# Patient Record
Sex: Male | Born: 1955 | Race: White | Hispanic: No | Marital: Single | Smoking: Former smoker
Health system: Southern US, Community
[De-identification: ages and names within clinical notes are randomized; demographics above are authoritative.]

## PROBLEM LIST (undated history)

## (undated) DIAGNOSIS — E7800 Pure hypercholesterolemia, unspecified: Secondary | ICD-10-CM

## (undated) DIAGNOSIS — I712 Thoracic aortic aneurysm, without rupture, unspecified: Secondary | ICD-10-CM

## (undated) DIAGNOSIS — I71019 Dissection of thoracic aorta, unspecified: Secondary | ICD-10-CM

## (undated) DIAGNOSIS — I350 Nonrheumatic aortic (valve) stenosis: Secondary | ICD-10-CM

## (undated) DIAGNOSIS — Z952 Presence of prosthetic heart valve: Secondary | ICD-10-CM

## (undated) DIAGNOSIS — I2510 Atherosclerotic heart disease of native coronary artery without angina pectoris: Secondary | ICD-10-CM

## (undated) DIAGNOSIS — K5790 Diverticulosis of intestine, part unspecified, without perforation or abscess without bleeding: Secondary | ICD-10-CM

## (undated) DIAGNOSIS — I7101 Dissection of thoracic aorta: Secondary | ICD-10-CM

## (undated) DIAGNOSIS — Z8489 Family history of other specified conditions: Secondary | ICD-10-CM

## (undated) DIAGNOSIS — K259 Gastric ulcer, unspecified as acute or chronic, without hemorrhage or perforation: Secondary | ICD-10-CM

## (undated) HISTORY — PX: OTHER SURGICAL HISTORY: SHX169

## (undated) HISTORY — DX: Diverticulosis of intestine, part unspecified, without perforation or abscess without bleeding: K57.90

## (undated) HISTORY — DX: Presence of prosthetic heart valve: Z95.2

## (undated) HISTORY — DX: Gastric ulcer, unspecified as acute or chronic, without hemorrhage or perforation: K25.9

## (undated) HISTORY — PX: EXPLORATION POST OPERATIVE OPEN HEART: SHX5061

## (undated) HISTORY — PX: ESOPHAGOGASTRODUODENOSCOPY: SHX1529

## (undated) HISTORY — DX: Pure hypercholesterolemia, unspecified: E78.00

## (undated) HISTORY — PX: ESOPHAGOGASTRODUODENOSCOPY: SHX5428

## (undated) HISTORY — DX: Dissection of thoracic aorta, unspecified: I71.019

## (undated) HISTORY — PX: CARDIAC VALVE REPLACEMENT: SHX585

## (undated) HISTORY — DX: Dissection of thoracic aorta: I71.01

## (undated) HISTORY — DX: Nonrheumatic aortic (valve) stenosis: I35.0

## (undated) HISTORY — DX: Thoracic aortic aneurysm, without rupture: I71.2

## (undated) HISTORY — DX: Thoracic aortic aneurysm, without rupture, unspecified: I71.20

---

## 2005-06-26 ENCOUNTER — Emergency Department (HOSPITAL_COMMUNITY): Admission: EM | Admit: 2005-06-26 | Discharge: 2005-06-26 | Payer: Self-pay | Admitting: Family Medicine

## 2009-09-05 ENCOUNTER — Ambulatory Visit: Payer: Self-pay | Admitting: Cardiology

## 2009-10-05 ENCOUNTER — Ambulatory Visit: Payer: Self-pay | Admitting: Cardiology

## 2009-11-06 ENCOUNTER — Ambulatory Visit: Payer: Self-pay | Admitting: Cardiology

## 2009-11-16 ENCOUNTER — Encounter: Payer: Self-pay | Admitting: Cardiology

## 2009-11-16 ENCOUNTER — Ambulatory Visit: Payer: Self-pay | Admitting: Cardiology

## 2009-11-16 ENCOUNTER — Ambulatory Visit (HOSPITAL_COMMUNITY): Admission: RE | Admit: 2009-11-16 | Discharge: 2009-11-16 | Payer: Self-pay | Admitting: Cardiology

## 2009-11-16 ENCOUNTER — Ambulatory Visit: Payer: Self-pay

## 2009-12-06 ENCOUNTER — Ambulatory Visit: Payer: Self-pay | Admitting: Cardiology

## 2009-12-06 ENCOUNTER — Encounter: Admission: RE | Admit: 2009-12-06 | Discharge: 2009-12-06 | Payer: Self-pay | Admitting: Cardiology

## 2009-12-11 ENCOUNTER — Encounter: Admission: RE | Admit: 2009-12-11 | Discharge: 2009-12-11 | Payer: Self-pay | Admitting: Cardiology

## 2009-12-21 ENCOUNTER — Ambulatory Visit: Payer: Self-pay | Admitting: Cardiothoracic Surgery

## 2010-01-09 ENCOUNTER — Ambulatory Visit: Payer: Self-pay | Admitting: Cardiology

## 2010-01-26 ENCOUNTER — Encounter: Payer: Self-pay | Admitting: Cardiology

## 2010-01-26 DIAGNOSIS — I350 Nonrheumatic aortic (valve) stenosis: Secondary | ICD-10-CM | POA: Insufficient documentation

## 2010-01-26 DIAGNOSIS — E7800 Pure hypercholesterolemia, unspecified: Secondary | ICD-10-CM | POA: Insufficient documentation

## 2010-01-26 DIAGNOSIS — Z952 Presence of prosthetic heart valve: Secondary | ICD-10-CM | POA: Insufficient documentation

## 2010-01-30 ENCOUNTER — Encounter: Payer: Self-pay | Admitting: Cardiology

## 2010-02-07 ENCOUNTER — Encounter (INDEPENDENT_AMBULATORY_CARE_PROVIDER_SITE_OTHER): Payer: 59

## 2010-02-07 DIAGNOSIS — Z7901 Long term (current) use of anticoagulants: Secondary | ICD-10-CM

## 2010-02-14 ENCOUNTER — Encounter (INDEPENDENT_AMBULATORY_CARE_PROVIDER_SITE_OTHER): Payer: 59

## 2010-02-14 DIAGNOSIS — I359 Nonrheumatic aortic valve disorder, unspecified: Secondary | ICD-10-CM

## 2010-02-14 DIAGNOSIS — Z7901 Long term (current) use of anticoagulants: Secondary | ICD-10-CM

## 2010-03-01 ENCOUNTER — Other Ambulatory Visit (INDEPENDENT_AMBULATORY_CARE_PROVIDER_SITE_OTHER): Payer: 59

## 2010-03-01 DIAGNOSIS — I359 Nonrheumatic aortic valve disorder, unspecified: Secondary | ICD-10-CM

## 2010-03-01 DIAGNOSIS — Z7901 Long term (current) use of anticoagulants: Secondary | ICD-10-CM

## 2010-03-09 ENCOUNTER — Ambulatory Visit (INDEPENDENT_AMBULATORY_CARE_PROVIDER_SITE_OTHER): Payer: 59 | Admitting: Cardiology

## 2010-03-09 DIAGNOSIS — Z954 Presence of other heart-valve replacement: Secondary | ICD-10-CM

## 2010-03-09 DIAGNOSIS — K277 Chronic peptic ulcer, site unspecified, without hemorrhage or perforation: Secondary | ICD-10-CM

## 2010-03-09 DIAGNOSIS — Z7901 Long term (current) use of anticoagulants: Secondary | ICD-10-CM

## 2010-03-09 DIAGNOSIS — I359 Nonrheumatic aortic valve disorder, unspecified: Secondary | ICD-10-CM

## 2010-04-10 ENCOUNTER — Ambulatory Visit (INDEPENDENT_AMBULATORY_CARE_PROVIDER_SITE_OTHER): Payer: 59 | Admitting: *Deleted

## 2010-04-10 DIAGNOSIS — I359 Nonrheumatic aortic valve disorder, unspecified: Secondary | ICD-10-CM

## 2010-04-10 DIAGNOSIS — Z7901 Long term (current) use of anticoagulants: Secondary | ICD-10-CM

## 2010-04-10 LAB — POCT INR: INR: 2.4

## 2010-04-11 ENCOUNTER — Other Ambulatory Visit: Payer: 59 | Admitting: *Deleted

## 2010-04-11 ENCOUNTER — Telehealth: Payer: Self-pay | Admitting: Cardiology

## 2010-04-11 NOTE — Telephone Encounter (Signed)
Advised should not need SBE for endoscopy, but may want to see what Dr Louis Matte recommendations are.  Also no need for lovenox injections or discontinuing coumadin unless there is potential for biopsy.  Patient states there is not any.

## 2010-04-11 NOTE — Telephone Encounter (Signed)
Having an endoscopy on 04/30/10.  Wants to know about premedication.

## 2010-04-17 ENCOUNTER — Telehealth: Payer: Self-pay | Admitting: Cardiology

## 2010-04-17 NOTE — Telephone Encounter (Signed)
Dr.Schooler's assistant, Juliette Alcide, would like to know who will initiate the Lovinox so that the patient can have his surgery.  Please call to confirm when the patient will discontinue the Coumadin

## 2010-04-18 ENCOUNTER — Telehealth: Payer: Self-pay | Admitting: *Deleted

## 2010-04-18 NOTE — Telephone Encounter (Signed)
L/M with an appt. Regarding his lab work prior to his office visit; appt was made

## 2010-04-24 NOTE — Telephone Encounter (Signed)
Left another message for patient to call back 

## 2010-04-25 NOTE — Telephone Encounter (Signed)
Spoke with patient and pharmacy regarding coumadin and lovenox.  Last dose of coumadin on 4/18, procedure on 4/23.  Will use lovenox 70 mg q 12 hours on 4/21,4/22,4/24, and 4/25.  Will resume coumadin after procedure.  Patient is having endoscopy with possible biopsy.

## 2010-04-25 NOTE — Telephone Encounter (Signed)
Plans for Lovenox bridging were reviewed and are appropriate.

## 2010-04-27 ENCOUNTER — Encounter: Payer: Self-pay | Admitting: Cardiology

## 2010-04-30 ENCOUNTER — Encounter: Payer: Self-pay | Admitting: Cardiology

## 2010-05-08 ENCOUNTER — Other Ambulatory Visit: Payer: Self-pay | Admitting: Cardiothoracic Surgery

## 2010-05-08 ENCOUNTER — Ambulatory Visit (INDEPENDENT_AMBULATORY_CARE_PROVIDER_SITE_OTHER): Payer: 59 | Admitting: *Deleted

## 2010-05-08 DIAGNOSIS — Z954 Presence of other heart-valve replacement: Secondary | ICD-10-CM | POA: Insufficient documentation

## 2010-05-08 DIAGNOSIS — I712 Thoracic aortic aneurysm, without rupture: Secondary | ICD-10-CM

## 2010-05-08 DIAGNOSIS — I359 Nonrheumatic aortic valve disorder, unspecified: Secondary | ICD-10-CM

## 2010-05-08 DIAGNOSIS — Z7901 Long term (current) use of anticoagulants: Secondary | ICD-10-CM | POA: Insufficient documentation

## 2010-05-08 LAB — POCT INR: INR: 2.2

## 2010-05-09 ENCOUNTER — Other Ambulatory Visit: Payer: 59 | Admitting: *Deleted

## 2010-05-17 ENCOUNTER — Encounter: Payer: Self-pay | Admitting: Cardiology

## 2010-05-22 NOTE — Assessment & Plan Note (Signed)
OFFICE VISIT   Ryan Lamb, Ryan Lamb  DOB:  1955/06/28                                        December 21, 2009  CHART #:  [id number]   REASON FOR CONSULTATION:  Dilated ascending aorta.   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  Prior to that at age 49, the patient  had undergone an open operative procedure on his aortic valve.  Over  this past 17 years, he has done extremely well with minimal problems  with Coumadin.  Currently, works in a Control and instrumentation engineer.  He has had no  evidence of heart failure or hypertension.  On routine chest x-ray, it  was suggested that he had some dilatation of his ascending aorta and a  CT scan of the chest was done and an echocardiogram that showed a  dilated ascending aorta to 4.8 cm.  He has had no other CT scans for  comparison.  He has no history of Marfan's or other connective tissue  disorder other than congenital aortic stenosis.   PAST MEDICAL HISTORY:  The patient does have hyperlipidemia.  No  previous myocardial infarction, no heart failure, no hypertension, no  diabetes.   PAST SURGICAL HISTORY:  As noted above.   SOCIAL HISTORY:  The patient is single, works in Control and instrumentation engineer.  He  smoked, but he quit smoking in 1981.  Does not drink alcohol.   FAMILY HISTORY:  Significant for his father who about 1 month after he  had his valve replaced; 17 years ago, had coronary artery bypass  grafting done by me.  There is no family history of Marfan's or aortic  dissections.   REVIEW OF SYSTEMS:  The patient denies change in weight.  Denies fever,  chills, or night sweats.  Denies hemoptysis.  He has noted over the past  several years episodes of small amount of rectal bleeding.  He  attributes this to when his INR gets a little higher than it should be,  though he has not previously  had a colonoscopy.   PHYSICAL EXAMINATION:  The patient's blood pressure 115/78, pulse 82,  respiratory rate is 18, and O2 sats 97%.  He is 5 feet 10 inches tall  and 163 pounds.  Temperature is 98.4.  He is alert, neurologically  intact.  His lungs are clear bilaterally.  Cardiac exam reveals regular  rate and rhythm.  Valve sounds are crisp.  There is no murmur of aortic  insufficiency.  He has no pedal edema.  Abdominal exam is benign without  palpable masses or tenderness.  He has full radial and brachial pulses  and femoral pulses bilaterally.   MEDICATIONS:  The patient's medications include Coumadin as regulated by  Dr. Yevonne Pax office, multivitamins, Simcor 1000/20, and aspirin 325 a  day.   ALLERGIES:  He has a questionable allergy to penicillin.   DIAGNOSTIC TESTS:  The patient's CT scan of the chest is reviewed and  the report of his echocardiogram is reviewed.  He does have an ascending  aorta that measures 4.7 cm.  The descending aorta is not structurally  enlarged.  By echo, his aortic root diameter is 37 mm.   IMPRESSION:  The patient now 17 years status post aortic valve  replacement with a mechanical valve.  It is noted to have incidental  dilatation of his ascending aorta without history of Marfan's or other  connective tissue disorder, though he does have a history of congenital  aortic stenosis.  I have reviewed the findings of the CT scan with the  patient and his family.  At this point, I have recommended that we can  start close surveillance.  In general, elective replacement of ascending  aorta would be about 5.5 cm.  However, with the patient's relatively  young age and overall good medical condition, the risk of surgery versus  progressive enlargement over the remainder of his lifetime may warrant  earlier intervention.  I have reviewed with him the signs and symptoms  of aortic dissection and I had instructed him should he experience any  of these to  seek emergency medical treatment immediately.  It is a  concern that he has had some rectal bleeding, though it appears minor at  age 19 and on Coumadin and with the potential, in the near future,  proceeding with replacement of his ascending aorta.  It would be prudent  to obtain a gastrointestinal consultation and colonoscopy to make sure  there is no underlying disease process that may influence a future valve  replacement.  I have made the patient a return appointment to see me in  6 months with a CT scan, CTA of the aorta to evaluate the size and see  if we determine a rate of change.  I will leave it up to Dr. Yevonne Pax  discretion to make a referral to gastrointestinal for a colonoscopy.   Sheliah Plane, MD  Electronically Signed   EG/MEDQ  D:  12/21/2009  T:  12/22/2009  Job:  [id number]   cc:   Cassell Clement, M.D.

## 2010-05-23 ENCOUNTER — Encounter: Payer: 59 | Admitting: *Deleted

## 2010-05-24 ENCOUNTER — Telehealth: Payer: Self-pay | Admitting: *Deleted

## 2010-05-24 NOTE — Telephone Encounter (Signed)
Left message, needs protime.  Was scheduled for 5/17 and no showed

## 2010-05-28 ENCOUNTER — Ambulatory Visit (INDEPENDENT_AMBULATORY_CARE_PROVIDER_SITE_OTHER): Payer: 59 | Admitting: *Deleted

## 2010-05-28 DIAGNOSIS — I359 Nonrheumatic aortic valve disorder, unspecified: Secondary | ICD-10-CM

## 2010-05-28 NOTE — Telephone Encounter (Signed)
Spoke with patient, no showed for INR last week.  Coming today

## 2010-06-25 ENCOUNTER — Ambulatory Visit (INDEPENDENT_AMBULATORY_CARE_PROVIDER_SITE_OTHER): Payer: 59 | Admitting: *Deleted

## 2010-06-25 DIAGNOSIS — I359 Nonrheumatic aortic valve disorder, unspecified: Secondary | ICD-10-CM

## 2010-07-19 ENCOUNTER — Ambulatory Visit
Admission: RE | Admit: 2010-07-19 | Discharge: 2010-07-19 | Disposition: A | Discharge status: Home / Self Care | Payer: 59 | Source: Ambulatory Visit | Attending: Cardiothoracic Surgery | Admitting: Cardiothoracic Surgery

## 2010-07-19 ENCOUNTER — Ambulatory Visit (INDEPENDENT_AMBULATORY_CARE_PROVIDER_SITE_OTHER): Payer: 59 | Admitting: Cardiothoracic Surgery

## 2010-07-19 DIAGNOSIS — I712 Thoracic aortic aneurysm, without rupture: Secondary | ICD-10-CM

## 2010-07-19 MED ORDER — IOHEXOL 300 MG/ML  SOLN
100.0000 mL | Freq: Once | INTRAMUSCULAR | Status: AC | PRN
  Administered 2010-07-19: 100 mL via INTRAVENOUS

## 2010-07-19 NOTE — Assessment & Plan Note (Signed)
OFFICE VISIT  Ryan Lamb, Ryan Lamb DOB:  10/16/1955                                        July 19, 2010 CHART #:  [id number]  The patient returns to the office today.  The patient in 1971, had aortic valvotomy and subsequently in 1994, I replaced his aortic valve with a 23 St. Jude mechanical prosthesis.  He came to see me in December of 2011, because on routine chest x-ray, there was a suggestion of some dilatation of his ascending aorta.  CT scan showed a dilated aorta between 4.5-4.8 cm.  At that time, he had also had history of mild rectal bleeding.  Since last seen, he has had colonoscopy, upper GI endoscopy, he had a polyp removed, and was treated for upper GI ulcer disease.  He has no family history of connective tissue or Marfan disease.  Since seen last seen, he has been asymptomatic, he has had no symptoms of heart failure.  On exam, his blood pressure is 129/81, pulse is 90, respiratory rate is 18, O2 sats 100% on room air.  Sternum is stable and well healed.  Lungs are clear bilaterally.  Cardiac exam reveals a regular rate.  Bowel sounds are crisp.  He has no murmur of aortic insufficiency.  He does have auscultation, a normally functioning mechanical valve.  Patient's medications include Coumadin, multivitamin, 325 mg of aspirin, Lipitor 40 mg a day, and Protonix 40 mg a day.  A followup CT scan is performed and compared to his previous scan December 2011, appears unchanged.  On my measurements, it appears that the aorta is dilated to 45-46 mm on various views.  At this point, with no change in the size of the ascending aorta, I have recommend the patient that we continue to have close surveillance, but not proceed with elective aorta replacement at this point, at 4.5-4.6 cm range.  The patient is not on beta-blocker or ACE inhibitor.  He is followed by Dr. Patty Sermons.  We could consider low-dose beta-blocker being started.  Today,  his blood pressure is 129/81.  I will plan to see him back in between 10 and 12 months with a repeat CTA of the chest.  Sheliah Plane, MD Electronically Signed  EG/MEDQ  D:  07/19/2010  T:  07/19/2010  Job:  [id number]  cc:   Cassell Clement, M.D.

## 2010-07-23 ENCOUNTER — Other Ambulatory Visit: Payer: Self-pay | Admitting: Cardiology

## 2010-07-23 DIAGNOSIS — Z952 Presence of prosthetic heart valve: Secondary | ICD-10-CM

## 2010-07-23 MED ORDER — WARFARIN SODIUM 5 MG PO TABS: 5.0000 mg | ORAL_TABLET | Freq: Every day | ORAL | Status: DC

## 2010-07-23 NOTE — Telephone Encounter (Signed)
Pt wants [facility]

## 2010-07-24 ENCOUNTER — Ambulatory Visit (INDEPENDENT_AMBULATORY_CARE_PROVIDER_SITE_OTHER): Payer: 59 | Admitting: *Deleted

## 2010-07-24 DIAGNOSIS — I359 Nonrheumatic aortic valve disorder, unspecified: Secondary | ICD-10-CM

## 2010-08-08 ENCOUNTER — Telehealth: Payer: Self-pay | Admitting: Cardiology

## 2010-08-08 NOTE — Telephone Encounter (Signed)
Please call annette back regarding how long pt needs to be off of coumadin to have lasik

## 2010-08-09 ENCOUNTER — Telehealth: Payer: Self-pay | Admitting: *Deleted

## 2010-08-09 NOTE — Telephone Encounter (Signed)
Receptionist from Dr. Almyra Brace office wanted to know if pt could stop coumadin prior to lasik eye surgery. Per Dr. Patty Sermons would prefer that pt not stop coumadin since he has had an Aortic Valve Replacement- he would have to be bridged w/Lovenax. States she will advise Dr. Anette Guarneri.

## 2010-08-21 ENCOUNTER — Ambulatory Visit (INDEPENDENT_AMBULATORY_CARE_PROVIDER_SITE_OTHER): Payer: 59 | Admitting: *Deleted

## 2010-08-21 DIAGNOSIS — I359 Nonrheumatic aortic valve disorder, unspecified: Secondary | ICD-10-CM

## 2010-08-21 LAB — POCT INR: INR: 2.6

## 2010-08-31 ENCOUNTER — Ambulatory Visit: Payer: 59 | Admitting: Cardiology

## 2010-09-17 ENCOUNTER — Ambulatory Visit: Payer: 59 | Admitting: Cardiology

## 2010-09-18 ENCOUNTER — Ambulatory Visit (INDEPENDENT_AMBULATORY_CARE_PROVIDER_SITE_OTHER): Payer: 59 | Admitting: *Deleted

## 2010-09-18 DIAGNOSIS — I359 Nonrheumatic aortic valve disorder, unspecified: Secondary | ICD-10-CM

## 2010-10-18 ENCOUNTER — Telehealth: Payer: Self-pay | Admitting: Cardiology

## 2010-10-18 ENCOUNTER — Ambulatory Visit (INDEPENDENT_AMBULATORY_CARE_PROVIDER_SITE_OTHER): Payer: 59 | Admitting: *Deleted

## 2010-10-18 ENCOUNTER — Encounter: Payer: Self-pay | Admitting: Cardiology

## 2010-10-18 ENCOUNTER — Ambulatory Visit (INDEPENDENT_AMBULATORY_CARE_PROVIDER_SITE_OTHER): Payer: 59 | Admitting: Cardiology

## 2010-10-18 VITALS — BP 108/62 | HR 74 | Ht 70.0 in | Wt 164.0 lb

## 2010-10-18 DIAGNOSIS — I712 Thoracic aortic aneurysm, without rupture: Secondary | ICD-10-CM

## 2010-10-18 DIAGNOSIS — I359 Nonrheumatic aortic valve disorder, unspecified: Secondary | ICD-10-CM

## 2010-10-18 DIAGNOSIS — E7800 Pure hypercholesterolemia, unspecified: Secondary | ICD-10-CM

## 2010-10-18 DIAGNOSIS — K219 Gastro-esophageal reflux disease without esophagitis: Secondary | ICD-10-CM

## 2010-10-18 DIAGNOSIS — Z952 Presence of prosthetic heart valve: Secondary | ICD-10-CM

## 2010-10-18 DIAGNOSIS — I7121 Aneurysm of the ascending aorta, without rupture: Secondary | ICD-10-CM

## 2010-10-18 NOTE — Assessment & Plan Note (Signed)
The patient has a past history of hypercholesterolemia.  He is on atorvastatin 40 mg daily.  He is having no side effects from the statin therapy

## 2010-10-18 NOTE — Progress Notes (Signed)
Ryan Lamb Date of Birth:  06-14-1955 [facility] / [facility] [address]   [address] [place], [place]  [address] [phone]           Fax   [phone]  HPI: This pleasant 55 year old gentleman is seen for a scheduled followup office visit.  He has a past history of aortic stenosis and has a St. Jude aortic valve prosthesis, which was placed in 1994.  He has remained in normal sinus rhythm.  He is on long-term Coumadin.  He has not had any chest pain or TIA symptoms.  He had an echocardiogram on 11/16/09, which showed a moderately dilated ascending aorta.  A CT angiogram of the chest with contrast showed an ascending aortic aneurysm, measuring 4.8 x 4.8 cm.  The patient subsequently had a consultation with Dr. Tyrone Sage who according to the patient recheck the size and it did not appear to be quite so large.  This lady is for watchful waiting, and the patient will be seen again by Dr. Tyrone Sage.  Next spring.  Patient also has a past history of peptic ulcer disease, and GERD.  He is under the care of Dr. Bosie Clos.  Current Outpatient Prescriptions  Medication Sig Dispense Refill  . aspirin 325 MG tablet Take 325 mg by mouth as needed.       Marland Kitchen atorvastatin (LIPITOR) 40 MG tablet Take 40 mg by mouth daily.        . MULTIPLE VITAMIN PO Take by mouth.        . pantoprazole (PROTONIX) 20 MG tablet Take 20 mg by mouth as needed.       . warfarin (COUMADIN) 5 MG tablet Take 1 tablet (5 mg total) by mouth daily. 5mg x 6 days 0 x1 day  90 tablet  3    Allergies  Allergen Reactions  . Penicillins     Patient Active Problem List  Diagnoses  . Aortic stenosis  . Aortic valve prosthesis present  . Hypercholesterolemia  . Aortic valve disorders  . Heart valve replaced by other means  . Encounter for long-term (current) use of anticoagulants  . Ascending aortic aneurysm    History  Smoking status  . Former Smoker -- 0.5 packs/day for 5 years  . Quit date: 10/17/1977    Smokeless tobacco  . Never Used    History  Alcohol Use No    No family history on file.  Review of Systems: The patient denies any heat or cold intolerance.  No weight gain or weight loss.  The patient denies headaches or blurry vision.  There is no cough or sputum production.  The patient denies dizziness.  There is no hematuria or hematochezia.  The patient denies any muscle aches or arthritis.  The patient denies any rash.  The patient denies frequent falling or instability.  There is no history of depression or anxiety.  All other systems were reviewed and are negative.   Physical Exam: Filed Vitals:   10/18/10 0849  BP: 108/62  Pulse: 74   the general appearance reveals a well-developed, well-nourished, gentleman in no distress.Pupils equal and reactive.   Extraocular Movements are full.  There is no scleral icterus.  The mouth and pharynx are normal.  The neck is supple.  The carotids reveal no bruits.  The jugular venous pressure is normal.  The thyroid is not enlarged.  There is no lymphadenopathy.  The chest is clear to percussion and auscultation. There are no rales or rhonchi.  Expansion of the chest is symmetrical.  The heart reveals a sharp opening and closing aortic valve click and no aortic insufficiency murmur.The abdomen is soft and nontender. Bowel sounds are normal. The liver and spleen are not enlarged. There Are no abdominal masses. There are no bruits.  The pedal pulses are good.  There is no phlebitis or edema.  There is no cyanosis or clubbing. Strength is normal and symmetrical in all extremities.  There is no lateralizing weakness.  There are no sensory deficits.  The skin is warm and dry.  There is no rash.     Assessment / Plan:  Continue same medication.  3 continue careful diet.  We will plan to recheck him in 4 months for office visit and EKG and fasting lab work.

## 2010-10-18 NOTE — Patient Instructions (Signed)
continue same dose of medications  Your physician wants you to follow-up in: 4 months You will receive a reminder letter in the mail two months in advance. If you don't receive a letter, please call our office to schedule the follow-up appointment.  

## 2010-10-18 NOTE — Telephone Encounter (Signed)
Pt called and would like for you to call him to update on some medications he is taking and to see if the tests he is due can be done earlier.  [phone].

## 2010-10-18 NOTE — Telephone Encounter (Signed)
Wanted labs done before end of year, scheduled

## 2010-10-18 NOTE — Assessment & Plan Note (Signed)
The patient has not been any cardiovascular symptoms.  He exercises regularly.  He plays golf.  He walks for exercise 3 times a week and he also does sit-ups and pushups at home on a regular basis.  His weight is down 2 pounds since last visit.

## 2010-11-15 ENCOUNTER — Ambulatory Visit (INDEPENDENT_AMBULATORY_CARE_PROVIDER_SITE_OTHER): Payer: 59 | Admitting: *Deleted

## 2010-11-15 DIAGNOSIS — I359 Nonrheumatic aortic valve disorder, unspecified: Secondary | ICD-10-CM

## 2010-11-15 DIAGNOSIS — K219 Gastro-esophageal reflux disease without esophagitis: Secondary | ICD-10-CM

## 2010-11-15 DIAGNOSIS — Z7901 Long term (current) use of anticoagulants: Secondary | ICD-10-CM

## 2010-11-15 DIAGNOSIS — E7800 Pure hypercholesterolemia, unspecified: Secondary | ICD-10-CM

## 2010-11-15 DIAGNOSIS — Z954 Presence of other heart-valve replacement: Secondary | ICD-10-CM

## 2010-11-15 LAB — LIPID PANEL
HDL: 48.8 mg/dL (ref >39.00)
Triglycerides: 158 mg/dL — ABNORMAL HIGH (ref 0.0–149.0)

## 2010-11-15 LAB — CBC WITH DIFFERENTIAL/PLATELET
Basophils Absolute: 0 10*3/uL (ref 0.0–0.1)
Basophils Relative: 0.3 % (ref 0.0–3.0)
Eosinophils Relative: 3.8 % (ref 0.0–5.0)
HCT: 46.7 % (ref 39.0–52.0)
Hemoglobin: 16 g/dL (ref 13.0–17.0)
Lymphs Abs: 1.4 10*3/uL (ref 0.7–4.0)
Monocytes Relative: 5.4 % (ref 3.0–12.0)
Neutro Abs: 4.1 10*3/uL (ref 1.4–7.7)
RBC: 5.18 Mil/uL (ref 4.22–5.81)
RDW: 13.5 % (ref 11.5–14.6)

## 2010-11-15 LAB — HEPATIC FUNCTION PANEL
ALT: 28 U/L (ref 0–53)
AST: 25 U/L (ref 0–37)
Albumin: 4.3 g/dL (ref 3.5–5.2)
Total Protein: 6.8 g/dL (ref 6.0–8.3)

## 2010-11-15 LAB — BASIC METABOLIC PANEL
Calcium: 9 mg/dL (ref 8.4–10.5)
Creatinine, Ser: 1 mg/dL (ref 0.4–1.5)
GFR: 87.77 mL/min (ref >60.00)
Sodium: 142 mEq/L (ref 135–145)

## 2010-11-15 LAB — POCT INR: INR: 3

## 2010-11-20 ENCOUNTER — Telehealth: Payer: Self-pay | Admitting: *Deleted

## 2010-11-20 NOTE — Telephone Encounter (Signed)
Mailed copy of labs and left message to call if any questions  

## 2010-11-20 NOTE — Telephone Encounter (Signed)
Message copied by Burnell Blanks on Tue Nov 20, 2010  2:09 PM ------      Message from: Cassell Clement      Created: Sat Nov 17, 2010  9:56 AM       Please report.  The labs are stable.  Continue same meds.  Continue careful diet.

## 2010-12-13 ENCOUNTER — Encounter: Payer: 59 | Admitting: *Deleted

## 2010-12-20 ENCOUNTER — Encounter: Payer: 59 | Admitting: *Deleted

## 2011-03-19 ENCOUNTER — Ambulatory Visit (INDEPENDENT_AMBULATORY_CARE_PROVIDER_SITE_OTHER): Payer: 59 | Admitting: Pharmacist

## 2011-03-19 DIAGNOSIS — Z7901 Long term (current) use of anticoagulants: Secondary | ICD-10-CM

## 2011-03-19 DIAGNOSIS — Z954 Presence of other heart-valve replacement: Secondary | ICD-10-CM

## 2011-03-19 DIAGNOSIS — I359 Nonrheumatic aortic valve disorder, unspecified: Secondary | ICD-10-CM

## 2011-04-22 ENCOUNTER — Ambulatory Visit (INDEPENDENT_AMBULATORY_CARE_PROVIDER_SITE_OTHER): Payer: 59 | Admitting: Cardiology

## 2011-04-22 ENCOUNTER — Ambulatory Visit (INDEPENDENT_AMBULATORY_CARE_PROVIDER_SITE_OTHER): Payer: 59 | Admitting: *Deleted

## 2011-04-22 ENCOUNTER — Encounter: Payer: Self-pay | Admitting: Cardiology

## 2011-04-22 VITALS — BP 128/80 | HR 71 | Ht 70.0 in | Wt 166.0 lb

## 2011-04-22 DIAGNOSIS — I359 Nonrheumatic aortic valve disorder, unspecified: Secondary | ICD-10-CM

## 2011-04-22 DIAGNOSIS — I712 Thoracic aortic aneurysm, without rupture: Secondary | ICD-10-CM

## 2011-04-22 DIAGNOSIS — E7800 Pure hypercholesterolemia, unspecified: Secondary | ICD-10-CM

## 2011-04-22 DIAGNOSIS — Z954 Presence of other heart-valve replacement: Secondary | ICD-10-CM

## 2011-04-22 DIAGNOSIS — Z952 Presence of prosthetic heart valve: Secondary | ICD-10-CM

## 2011-04-22 DIAGNOSIS — K219 Gastro-esophageal reflux disease without esophagitis: Secondary | ICD-10-CM

## 2011-04-22 DIAGNOSIS — Z7901 Long term (current) use of anticoagulants: Secondary | ICD-10-CM

## 2011-04-22 NOTE — Patient Instructions (Signed)
Your physician wants you to follow-up in: 4 months with Dr. Patty Sermons.  You will receive a reminder letter in the mail two months in advance. If you don't receive a letter, please call our office to schedule the follow-up appointment.  Your physician recommends that you return for fasting lab work in: 4 months.

## 2011-04-22 NOTE — Progress Notes (Signed)
Ryan Lamb Date of Birth:  1955/02/07 [facility] [address] [place], [place]  [address] [phone]  Fax   [phone]  HPI: This pleasant 56 year old gentleman is seen for a scheduled four-month followup office visit.  He has a past history of ear aortic stenosis and had a St. Jude aortic valve prosthesis placed in 1994.  He has remained in normal sinus rhythm.  He is on long-term Coumadin and has been compliant.  He had an echocardiogram in November 2011 which showed a moderately dilated descending aorta and this is being followed by Dr. Tyrone Sage.  Patient has a past history of peptic ulcer disease and GERD.  He has had no recent symptoms of GI distress and no evidence of hematochezia or melena.  Current Outpatient Prescriptions  Medication Sig Dispense Refill  . aspirin 325 MG tablet Take 325 mg by mouth as needed.       Marland Kitchen atorvastatin (LIPITOR) 40 MG tablet Take 10 mg by mouth daily.       . MULTIPLE VITAMIN PO Take by mouth.        . pantoprazole (PROTONIX) 20 MG tablet Take 20 mg by mouth as needed.       . warfarin (COUMADIN) 5 MG tablet Take 1 tablet (5 mg total) by mouth daily. 5mg x 6 days 0 x1 day  90 tablet  3    Allergies  Allergen Reactions  . Penicillins     Patient Active Problem List  Diagnoses  . Aortic stenosis  . Aortic valve prosthesis present  . Hypercholesterolemia  . Aortic valve disorders  . Heart valve replaced by other means  . Encounter for long-term (current) use of anticoagulants  . Ascending aortic aneurysm    History  Smoking status  . Former Smoker -- 0.5 packs/day for 5 years  . Quit date: 10/17/1977  Smokeless tobacco  . Never Used    History  Alcohol Use No    No family history on file.  Review of Systems: The patient denies any heat or cold intolerance.  No weight gain or weight loss.  The patient denies headaches or blurry vision.  There is no cough or sputum production.  The patient denies  dizziness.  There is no hematuria or hematochezia.  The patient denies any muscle aches or arthritis.  The patient denies any rash.  The patient denies frequent falling or instability.  There is no history of depression or anxiety.  All other systems were reviewed and are negative.   Physical Exam: Filed Vitals:   04/22/11 1101  BP: 128/80  Pulse: 71   the general appearance reveals a well-developed well-nourished gentleman in no distress.Pupils equal and reactive.   Extraocular Movements are full.  There is no scleral icterus.  The mouth and pharynx are normal.  The neck is supple.  The carotids reveal no bruits.  The jugular venous pressure is normal.  The thyroid is not enlarged.  There is no lymphadenopathy.  The chest is clear to percussion and auscultation. There are no rales or rhonchi. Expansion of the chest is symmetrical.  The heart reveals good aortic opening and closing aortic valve clicks and no aortic insufficiency.The abdomen is soft and nontender. Bowel sounds are normal. The liver and spleen are not enlarged. There Are no abdominal masses. There are no bruits.  The pedal pulses are good.  There is no phlebitis or edema.  There is no cyanosis or clubbing. Strength is normal and symmetrical in all  extremities.  There is no lateralizing weakness.  There are no sensory deficits.   EKG today shows normal sinus rhythm with sinus arrhythmia and no ischemic changes.    Assessment / Plan: The patient will continue same medication.  He has had some chronic right ear problems and Dr. Haroldine Laws recently treated him with doxycycline for suspected sinusitis.  The patient also has been having some problems with scrotal irritation and I gave him the name of a urologist Dr. Bjorn Pippin. Recheck here in 4 months for followup office visit lipid panel hepatic function panel and basal metabolic panel

## 2011-04-22 NOTE — Assessment & Plan Note (Signed)
The patient takes Protonix 20 mg daily as needed and has not been having any recent GI symptoms

## 2011-04-22 NOTE — Assessment & Plan Note (Addendum)
The patient has not been experiencing any symptoms referable to his ascending aortic aneurysm which is being followed by Dr. Tyrone Sage.

## 2011-04-22 NOTE — Assessment & Plan Note (Signed)
The patient has done well following his aortic valve replacement.  He is physically active.  He goes to the gym.  He has not been experiencing any exertional chest pain or increased dyspnea.  He also enjoys playing golf.  He also gets a lot of exercise at work.  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

## 2011-05-17 ENCOUNTER — Other Ambulatory Visit: Payer: Self-pay | Admitting: Cardiothoracic Surgery

## 2011-05-17 DIAGNOSIS — I712 Thoracic aortic aneurysm, without rupture: Secondary | ICD-10-CM

## 2011-06-18 ENCOUNTER — Ambulatory Visit (INDEPENDENT_AMBULATORY_CARE_PROVIDER_SITE_OTHER): Payer: 59

## 2011-06-18 DIAGNOSIS — Z7901 Long term (current) use of anticoagulants: Secondary | ICD-10-CM

## 2011-06-18 DIAGNOSIS — I359 Nonrheumatic aortic valve disorder, unspecified: Secondary | ICD-10-CM

## 2011-06-18 DIAGNOSIS — Z954 Presence of other heart-valve replacement: Secondary | ICD-10-CM

## 2011-06-20 ENCOUNTER — Other Ambulatory Visit: Payer: Self-pay | Admitting: Cardiology

## 2011-06-20 MED ORDER — ATORVASTATIN CALCIUM 40 MG PO TABS: 10.0000 mg | ORAL_TABLET | Freq: Every day | ORAL | Status: DC

## 2011-06-20 MED ORDER — ATORVASTATIN CALCIUM 10 MG PO TABS: 10.0000 mg | ORAL_TABLET | Freq: Every day | ORAL | Status: DC

## 2011-06-20 NOTE — Telephone Encounter (Signed)
Spoke with patient re-refill on atorvastatin 10mg . Daily, [facility]

## 2011-06-20 NOTE — Telephone Encounter (Signed)
New problem    [facility]  Phone number [phone]

## 2011-06-27 ENCOUNTER — Other Ambulatory Visit: Payer: 59

## 2011-06-27 ENCOUNTER — Ambulatory Visit: Payer: 59 | Admitting: Cardiothoracic Surgery

## 2011-07-16 ENCOUNTER — Ambulatory Visit (INDEPENDENT_AMBULATORY_CARE_PROVIDER_SITE_OTHER): Payer: 59 | Admitting: Pharmacist

## 2011-07-16 DIAGNOSIS — I359 Nonrheumatic aortic valve disorder, unspecified: Secondary | ICD-10-CM

## 2011-07-16 DIAGNOSIS — Z7901 Long term (current) use of anticoagulants: Secondary | ICD-10-CM

## 2011-07-16 DIAGNOSIS — Z954 Presence of other heart-valve replacement: Secondary | ICD-10-CM

## 2011-07-16 LAB — POCT INR: INR: 2.7

## 2011-07-25 ENCOUNTER — Ambulatory Visit: Payer: 59 | Admitting: Cardiothoracic Surgery

## 2011-07-25 ENCOUNTER — Other Ambulatory Visit: Payer: 59

## 2011-08-15 ENCOUNTER — Ambulatory Visit: Payer: 59 | Admitting: Cardiothoracic Surgery

## 2011-08-15 ENCOUNTER — Other Ambulatory Visit: Payer: 59

## 2011-08-20 ENCOUNTER — Encounter: Payer: Self-pay | Admitting: Cardiology

## 2011-08-20 ENCOUNTER — Ambulatory Visit (INDEPENDENT_AMBULATORY_CARE_PROVIDER_SITE_OTHER): Payer: 59 | Admitting: Cardiology

## 2011-08-20 ENCOUNTER — Other Ambulatory Visit (INDEPENDENT_AMBULATORY_CARE_PROVIDER_SITE_OTHER): Payer: 59

## 2011-08-20 ENCOUNTER — Ambulatory Visit (INDEPENDENT_AMBULATORY_CARE_PROVIDER_SITE_OTHER): Payer: 59 | Admitting: *Deleted

## 2011-08-20 VITALS — BP 110/72 | HR 83 | Ht 70.0 in | Wt 163.8 lb

## 2011-08-20 DIAGNOSIS — I712 Thoracic aortic aneurysm, without rupture: Secondary | ICD-10-CM

## 2011-08-20 DIAGNOSIS — Z952 Presence of prosthetic heart valve: Secondary | ICD-10-CM

## 2011-08-20 DIAGNOSIS — K219 Gastro-esophageal reflux disease without esophagitis: Secondary | ICD-10-CM

## 2011-08-20 DIAGNOSIS — Z7901 Long term (current) use of anticoagulants: Secondary | ICD-10-CM

## 2011-08-20 DIAGNOSIS — I359 Nonrheumatic aortic valve disorder, unspecified: Secondary | ICD-10-CM

## 2011-08-20 DIAGNOSIS — Z954 Presence of other heart-valve replacement: Secondary | ICD-10-CM

## 2011-08-20 DIAGNOSIS — R0989 Other specified symptoms and signs involving the circulatory and respiratory systems: Secondary | ICD-10-CM

## 2011-08-20 DIAGNOSIS — E7800 Pure hypercholesterolemia, unspecified: Secondary | ICD-10-CM

## 2011-08-20 LAB — LIPID PANEL
HDL: 49.8 mg/dL (ref >39.00)
LDL Cholesterol: 94 mg/dL (ref 0–99)
Total CHOL/HDL Ratio: 3
Triglycerides: 107 mg/dL (ref 0.0–149.0)
VLDL: 21.4 mg/dL (ref 0.0–40.0)

## 2011-08-20 LAB — POCT INR: INR: 4.3

## 2011-08-20 LAB — HEPATIC FUNCTION PANEL
Albumin: 4.2 g/dL (ref 3.5–5.2)
Alkaline Phosphatase: 51 U/L (ref 39–117)
Total Protein: 6.9 g/dL (ref 6.0–8.3)

## 2011-08-20 LAB — BASIC METABOLIC PANEL
CO2: 24 mEq/L (ref 19–32)
Chloride: 105 mEq/L (ref 96–112)
Creatinine, Ser: 1 mg/dL (ref 0.4–1.5)
Sodium: 137 mEq/L (ref 135–145)

## 2011-08-20 NOTE — Patient Instructions (Addendum)
Your physician recommends that you continue on your current medications as directed. Please refer to the Current Medication list given to you today.  Your physician recommends that you schedule a follow-up appointment in: 4 months with fasting labs (lp/bmet/hfp)   Will obtain labs today and call you with the results    

## 2011-08-20 NOTE — Assessment & Plan Note (Signed)
The patient has an appointment to see Dr. Tyrone Sage on September 8 for followup of his ascending aortic aneurysm

## 2011-08-20 NOTE — Assessment & Plan Note (Signed)
The patient has not been experiencing any chest pain or shortness of breath.  He exercises regularly at home and he also plays golf.

## 2011-08-20 NOTE — Progress Notes (Signed)
Ryan Lamb Date of Birth:  01-29-1955 [facility] [address] [place], [place]  [address] [phone]  Fax   [phone]  HPI: This pleasant 56 year old gentleman is seen for a scheduled four-month followup office visit.  He has a history of previous severe aortic stenosis.  He had a St. Jude aortic valve prosthesis placed in 1994 has a known moderately dilated descending aorta and this is being followed by Dr. Tyrone Sage.  Noted on echo in November 2011 the patient has been on long-term Coumadin for his mechanical aortic valve prosthesis.  He remains in normal sinus rhythm.  Current Outpatient Prescriptions  Medication Sig Dispense Refill  . aspirin 325 MG tablet Take 325 mg by mouth as needed.       Marland Kitchen atorvastatin (LIPITOR) 10 MG tablet Take 1 tablet (10 mg total) by mouth daily.  90 tablet  3  . MULTIPLE VITAMIN PO Take by mouth.        . pantoprazole (PROTONIX) 20 MG tablet Take 20 mg by mouth as needed.       . warfarin (COUMADIN) 5 MG tablet Take 1 tablet (5 mg total) by mouth daily. 5mg x 6 days 0 x1 day  90 tablet  3    Allergies  Allergen Reactions  . Penicillins     Patient Active Problem List  Diagnosis  . Aortic stenosis  . Aortic valve prosthesis present  . Hypercholesterolemia  . Aortic valve disorders  . Heart valve replaced by other means  . Encounter for long-term (current) use of anticoagulants  . Ascending aortic aneurysm  . GERD (gastroesophageal reflux disease)    History  Smoking status  . Former Smoker -- 0.5 packs/day for 5 years  . Quit date: 10/17/1977  Smokeless tobacco  . Never Used    History  Alcohol Use No    No family history on file.  Review of Systems: The patient denies any heat or cold intolerance.  No weight gain or weight loss.  The patient denies headaches or blurry vision.  There is no cough or sputum production.  The patient denies dizziness.  There is no hematuria or hematochezia.  The  patient denies any muscle aches or arthritis.  The patient denies any rash.  The patient denies frequent falling or instability.  There is no history of depression or anxiety.  All other systems were reviewed and are negative.   Physical Exam: Filed Vitals:   08/20/11 0821  BP: 110/72  Pulse: 83   the general appearance reveals a well-developed well-nourished gentleman distress.The head and neck exam reveals pupils equal and reactive.  Extraocular movements are full.  There is no scleral icterus.  The mouth and pharynx are normal.  The neck is supple.  The carotids reveal no bruits.  The jugular venous pressure is normal.  The  thyroid is not enlarged.  There is no lymphadenopathy.  The chest is clear to percussion and auscultation.  There are no rales or rhonchi.  Expansion of the chest is symmetrical.  The precordium is quiet.  The first heart sound is normal.  The second heart sound is physiologically split.  There are good and opening and closing prosthetic aortic valve sounds with a soft systolic murmur across the aortic valve prosthesis.  No diastolic murmur.  There is no abnormal lift or heave.  The abdomen is soft and nontender.  The bowel sounds are normal.  The liver and spleen are not enlarged.  There are no  abdominal masses.  There are no abdominal bruits.  Extremities reveal good pedal pulses.  There is no phlebitis or edema.  There is no cyanosis or clubbing.  Strength is normal and symmetrical in all extremities.  There is no lateralizing weakness.  There are no sensory deficits.  The skin is warm and dry.  There is no rash.      Assessment / Plan: Continue on same medication.  Lab work today pending.  Recheck in 4 months for followup office visit lipid panel hepatic function panel and basal metabolic panel and a CBC

## 2011-08-20 NOTE — Assessment & Plan Note (Signed)
The patient has a remote history of GI bleed.  He has GERD.  He has had no recent symptoms.  He takes Protonix on a when necessary basis

## 2011-08-20 NOTE — Assessment & Plan Note (Signed)
The patient has a history of hypercholesterolemia.  He is on Lipitor 10 mg daily.  He is not having any myalgias.  Blood work is pending

## 2011-08-21 NOTE — Progress Notes (Signed)
Quick Note:  Please report to patient. The recent labs are stable. Continue same medication and careful diet. The potassium is low so I want him to eat some bananas and other high potassium foods. He is not on any diuretic and his potassium was normal last time. We should recheck a basal metabolic panel when he returns for his next INR ______

## 2011-08-23 ENCOUNTER — Telehealth: Payer: Self-pay | Admitting: Cardiology

## 2011-08-23 DIAGNOSIS — E876 Hypokalemia: Secondary | ICD-10-CM

## 2011-08-23 NOTE — Telephone Encounter (Signed)
Advised patient of lab results and scheduled follow up labas

## 2011-08-23 NOTE — Telephone Encounter (Signed)
Pt spoke with you a couple of days ago and he was suppose to call you today so that is what he was doing please give him a call back

## 2011-08-23 NOTE — Telephone Encounter (Signed)
Message copied by Burnell Blanks on Fri Aug 23, 2011 11:17 AM ------      Message from: Cassell Clement      Created: Wed Aug 21, 2011  3:33 PM       Please report to patient.  The recent labs are stable. Continue same medication and careful diet.  The potassium is low so I want him to eat some bananas and other high potassium foods.  He is not on any diuretic and his potassium was normal last time.  We should recheck a basal metabolic panel when he returns for his next INR

## 2011-09-10 ENCOUNTER — Other Ambulatory Visit (INDEPENDENT_AMBULATORY_CARE_PROVIDER_SITE_OTHER): Payer: 59

## 2011-09-10 ENCOUNTER — Ambulatory Visit (INDEPENDENT_AMBULATORY_CARE_PROVIDER_SITE_OTHER): Payer: 59 | Admitting: Pharmacist

## 2011-09-10 DIAGNOSIS — Z7901 Long term (current) use of anticoagulants: Secondary | ICD-10-CM

## 2011-09-10 DIAGNOSIS — Z954 Presence of other heart-valve replacement: Secondary | ICD-10-CM

## 2011-09-10 DIAGNOSIS — I359 Nonrheumatic aortic valve disorder, unspecified: Secondary | ICD-10-CM

## 2011-09-10 DIAGNOSIS — E876 Hypokalemia: Secondary | ICD-10-CM

## 2011-09-10 LAB — BASIC METABOLIC PANEL
BUN: 12 mg/dL (ref 6–23)
Creatinine, Ser: 1.1 mg/dL (ref 0.4–1.5)
GFR: 74.66 mL/min (ref >60.00)
Glucose, Bld: 81 mg/dL (ref 70–99)

## 2011-09-12 ENCOUNTER — Ambulatory Visit (INDEPENDENT_AMBULATORY_CARE_PROVIDER_SITE_OTHER): Payer: 59 | Admitting: Cardiothoracic Surgery

## 2011-09-12 ENCOUNTER — Other Ambulatory Visit: Payer: 59

## 2011-09-12 ENCOUNTER — Encounter: Payer: Self-pay | Admitting: Cardiothoracic Surgery

## 2011-09-12 ENCOUNTER — Ambulatory Visit
Admission: RE | Admit: 2011-09-12 | Discharge: 2011-09-12 | Disposition: A | Discharge status: Home / Self Care | Payer: 59 | Source: Ambulatory Visit | Attending: Cardiothoracic Surgery | Admitting: Cardiothoracic Surgery

## 2011-09-12 VITALS — BP 130/71 | HR 84 | Resp 20 | Ht 70.0 in | Wt 165.0 lb

## 2011-09-12 DIAGNOSIS — I712 Thoracic aortic aneurysm, without rupture: Secondary | ICD-10-CM

## 2011-09-12 MED ORDER — IOHEXOL 300 MG/ML  SOLN
100.0000 mL | Freq: Once | INTRAMUSCULAR | Status: AC | PRN
  Administered 2011-09-12: 100 mL via INTRAVENOUS

## 2011-09-12 NOTE — Progress Notes (Signed)
[address]            [place] [address]          [phone]      [facility] Medical Record #[id number] Date of Birth: [birth year]  Referring: Cassell Clement, MD Primary Care: No primary provider on file.  Chief Complaint:    Chief Complaint  Patient presents with  . Thoracic Aortic Aneurysm    1 year f/u wtih CTA Chest, surveillance of ascending aorta aneurysm    History of Present Illness:    Patient returns today with a followup CTA of the chest. In 1971 the patient had aortic valvuloplasty. Subsequently in 1994 I replaced his aortic valve with a #23 St. Jude mechanical prosthesis. In December of 2011 because of a chest x-ray suggestive of dilatation of his descending aorta he was seen again and has had CT scans of the chest in 2011 2012 and today. He has no family history of connective tissue or Marfan's disorder. He remains asymptomatic.    Current Activity/ Functional Status: Patient remains active without any physical limitations    Past Medical History  Diagnosis Date  . Aortic stenosis as infant, with recurrent as and valve replacement   . Aortic valve prosthesis present   . Hypercholesterolemia     Past Surgical History  Procedure Date  . Cardiac valve replacement 1994    No family history on file. There is no family history of Marfan's or other connective tissue disorder or sudden death from dissection .  History   Social History  . Marital Status: Single    Spouse Name: N/A    Number of Children: N/A  . Years of Education: N/A   Occupational History  . Not on file.   Social History Main Topics  . Smoking status: Former Smoker -- 0.5 packs/day for 5 years    Quit date: 10/17/1977  . Smokeless tobacco: Never Used  . Alcohol Use: No  . Drug Use: No              Social History Narrative  . No narrative on file    History  Smoking status  . Former Smoker -- 0.5 packs/day for 5  years  . Quit date: 10/17/1977  Smokeless tobacco  . Never Used    History  Alcohol Use No     Allergies  Allergen Reactions  . Penicillins     Current Outpatient Prescriptions  Medication Sig Dispense Refill  . aspirin 325 MG tablet Take 325 mg by mouth as needed.       Marland Kitchen atorvastatin (LIPITOR) 10 MG tablet Take 1 tablet (10 mg total) by mouth daily.  90 tablet  3  . MULTIPLE VITAMIN PO Take by mouth.        . pantoprazole (PROTONIX) 20 MG tablet Take 20 mg by mouth as needed.       . warfarin (COUMADIN) 5 MG tablet Take 1 tablet (5 mg total) by mouth daily. 5mg x 6 days 0 x1 day  90 tablet  3   No current facility-administered medications for this visit.   Facility-Administered Medications Ordered in Other Visits  Medication Dose Route Frequency Provider Last Rate Last Dose  . iohexol (OMNIPAQUE) 300 MG/ML solution 100 mL  100 mL Intravenous Once PRN Medication Radiologist, MD   100 mL at 09/12/11 1511  Review of Systems:     Cardiac Review of Systems: Y or N  Chest Pain [  n  ]  Resting SOB [n   ] Exertional SOB  [ n ]  Orthopnea [  n]   Pedal Edema [   n]    Palpitations [ n ] Syncope  [ n ]   Presyncope [ n  ]  General Review of Systems: [Y] = yes [  ]=no Constitional: recent weight change [n  ]; anorexia [  ]; fatigue [  ]; nausea [  ]; night sweats [  ]; fever [  ]; or chills [  ];                                                                                                                                          Dental: poor dentition[n  ]; Last Dentist visit: yearly  Eye : blurred vision [  ]; diplopia [   ]; vision changes [  ];  Amaurosis fugax[  ]; Resp: cough [  ];  wheezing[  ];  hemoptysis[  ]; shortness of breath[  ]; paroxysmal nocturnal dyspnea[  ]; dyspnea on exertion[  ]; or orthopnea[  ];  GI:  gallstones[  ], vomiting[  ];  dysphagia[  ]; melena[ in past pylop removed ];  hematochezia [  ]; heartburn[  ];   Hx of  Colonoscopy[ y ]; GU: kidney  stones [  ]; hematuria[  ];   dysuria [  ];  nocturia[  ];  history of     obstruction [  ];             Skin: rash, swelling[  ];, hair loss[  ];  peripheral edema[  ];  or itching[  ]; Musculosketetal: myalgias[  ];  joint swelling[  ];  joint erythema[  ];  joint pain[  ];  back pain[  ];  Heme/Lymph: bruising[on coumadin  ];  bleeding[  ];  anemia[  ];  Neuro: TIA[ n ];  headaches[  ];  stroke[  ];  vertigo[  ];  seizures[  ];   paresthesias[  ];  difficulty walking[  ];  Psych:depression[  ]; anxiety[  ];  Endocrine: diabetes[  ];  thyroid dysfunction[  ];  Immunizations: Flu [  ]; Pneumococcal[  ];  Other:  Physical Exam: BP 130/71  Pulse 84  Resp 20  Ht 5\' 10"  (1.778 m)  Wt 165 lb (74.844 kg)  BMI 23.68 kg/m2  SpO2 99%  General appearance: alert, cooperative and appears stated age Neurologic: intact Heart: normal apical impulse and crisp mechanical valve sound s Lungs: clear to auscultation bilaterally and normal percussion bilaterally Abdomen: soft, non-tender; bowel sounds normal; no masses,  no organomegaly Extremities: extremities normal, atraumatic, no cyanosis or edema and Homans sign is negative, no sign of DVT Wound: stable incision   Diagnostic  Studies & Laboratory data:     Recent Radiology Findings:   Ct Angio Chest W/cm &/or Wo Cm  09/12/2011  *RADIOLOGY REPORT*  Clinical Data: Follow-up thoracic aortic aneurysm.  CT ANGIOGRAPHY CHEST  Technique:  Multidetector CT imaging of the chest using the standard protocol during bolus administration of intravenous contrast. Multiplanar reconstructed images including MIPs were obtained and reviewed to evaluate the vascular anatomy.  Contrast: OMNIPAQUE IOHEXOL 300 MG/ML  SOLN  Comparison: 07/19/2010  Findings: The ascending thoracic aortic aneurysm remains stable in size, measuring 4.8 cm in maximum diameter.  No evidence of aneurysm leak or rupture or thoracic aortic dissection.  No evidence of mediastinal or hilar  lymphadenopathy.  No adenopathy seen elsewhere within the thorax.  No evidence of pleural or pericardial effusion.  Both lungs are clear.  No central endobronchial lesion identified.  IMPRESSION: Stable 4.8 cm ascending thoracic aortic aneurysm.  No acute findings.   Original Report Authenticated By: Danae Orleans, M.D.       Recent Lab Findings: Lab Results  Component Value Date   WBC 6.1 11/15/2010   HGB 16.0 11/15/2010   HCT 46.7 11/15/2010   PLT 206.0 11/15/2010   GLUCOSE 81 09/10/2011   CHOL 165 08/20/2011   TRIG 107.0 08/20/2011   HDL 49.80 08/20/2011   LDLCALC 94 08/20/2011   ALT 20 08/20/2011   AST 23 08/20/2011   NA 142 09/10/2011   K 3.8 09/10/2011   CL 108 09/10/2011   CREATININE 1.1 09/10/2011   BUN 12 09/10/2011   CO2 28 09/10/2011   INR 3.2 09/10/2011      Assessment / Plan:      No change in four point eight centimeters ascending aortic dilatation in patient with previous mechanic aortic valve replacement. There's been no appreciable change in the size of the aorta since his scan in 2011. I discussed the diagnosis with the patient and we have decided to continue to monitor the size of his aorta and will obtain a followup CT scan in one year. I've discussed with him the elective replacement of the ascending aorta as it approaches 5.5 cm in the absence of a history of Marfan's. Patient is agreeable with this.  Delight Ovens MD  Beeper [phone] Office [phone] 09/12/2011 5:38 PM

## 2011-09-12 NOTE — Patient Instructions (Signed)
Aorta stable in size 4.8 cm, repeat ct in one year

## 2011-09-13 ENCOUNTER — Telehealth: Payer: Self-pay | Admitting: *Deleted

## 2011-09-13 NOTE — Telephone Encounter (Signed)
Advised patient of lab results  

## 2011-09-13 NOTE — Telephone Encounter (Signed)
Message copied by Burnell Blanks on Fri Sep 13, 2011  6:01 PM ------      Message from: Cassell Clement      Created: Tue Sep 10, 2011 12:38 PM       Please report.  The potassium is now back to normal.  Continue same medication and careful diet

## 2011-10-01 ENCOUNTER — Ambulatory Visit (INDEPENDENT_AMBULATORY_CARE_PROVIDER_SITE_OTHER): Payer: 59 | Admitting: *Deleted

## 2011-10-01 DIAGNOSIS — Z7901 Long term (current) use of anticoagulants: Secondary | ICD-10-CM

## 2011-10-01 DIAGNOSIS — Z954 Presence of other heart-valve replacement: Secondary | ICD-10-CM

## 2011-10-01 DIAGNOSIS — I359 Nonrheumatic aortic valve disorder, unspecified: Secondary | ICD-10-CM

## 2011-10-01 LAB — POCT INR: INR: 3.3

## 2011-10-21 ENCOUNTER — Other Ambulatory Visit: Payer: Self-pay | Admitting: Cardiology

## 2011-10-29 ENCOUNTER — Ambulatory Visit (INDEPENDENT_AMBULATORY_CARE_PROVIDER_SITE_OTHER): Payer: 59

## 2011-10-29 DIAGNOSIS — I359 Nonrheumatic aortic valve disorder, unspecified: Secondary | ICD-10-CM

## 2011-10-29 DIAGNOSIS — Z7901 Long term (current) use of anticoagulants: Secondary | ICD-10-CM

## 2011-10-29 DIAGNOSIS — Z954 Presence of other heart-valve replacement: Secondary | ICD-10-CM

## 2011-11-26 ENCOUNTER — Ambulatory Visit (INDEPENDENT_AMBULATORY_CARE_PROVIDER_SITE_OTHER): Payer: 59 | Admitting: *Deleted

## 2011-11-26 DIAGNOSIS — Z954 Presence of other heart-valve replacement: Secondary | ICD-10-CM

## 2011-11-26 DIAGNOSIS — Z7901 Long term (current) use of anticoagulants: Secondary | ICD-10-CM

## 2011-11-26 DIAGNOSIS — I359 Nonrheumatic aortic valve disorder, unspecified: Secondary | ICD-10-CM

## 2011-12-20 ENCOUNTER — Other Ambulatory Visit (INDEPENDENT_AMBULATORY_CARE_PROVIDER_SITE_OTHER): Payer: 59

## 2011-12-20 ENCOUNTER — Ambulatory Visit (INDEPENDENT_AMBULATORY_CARE_PROVIDER_SITE_OTHER): Payer: 59 | Admitting: *Deleted

## 2011-12-20 ENCOUNTER — Encounter: Payer: Self-pay | Admitting: Cardiology

## 2011-12-20 ENCOUNTER — Ambulatory Visit (INDEPENDENT_AMBULATORY_CARE_PROVIDER_SITE_OTHER): Payer: 59 | Admitting: Cardiology

## 2011-12-20 VITALS — BP 121/82 | HR 67 | Resp 18 | Ht 70.0 in | Wt 165.8 lb

## 2011-12-20 DIAGNOSIS — Z954 Presence of other heart-valve replacement: Secondary | ICD-10-CM

## 2011-12-20 DIAGNOSIS — Z7901 Long term (current) use of anticoagulants: Secondary | ICD-10-CM

## 2011-12-20 DIAGNOSIS — K219 Gastro-esophageal reflux disease without esophagitis: Secondary | ICD-10-CM

## 2011-12-20 DIAGNOSIS — Z952 Presence of prosthetic heart valve: Secondary | ICD-10-CM

## 2011-12-20 DIAGNOSIS — E7800 Pure hypercholesterolemia, unspecified: Secondary | ICD-10-CM

## 2011-12-20 DIAGNOSIS — I359 Nonrheumatic aortic valve disorder, unspecified: Secondary | ICD-10-CM

## 2011-12-20 LAB — LIPID PANEL
Cholesterol: 176 mg/dL (ref 0–200)
HDL: 47.7 mg/dL (ref >39.00)
LDL Cholesterol: 108 mg/dL — ABNORMAL HIGH (ref 0–99)
VLDL: 20.2 mg/dL (ref 0.0–40.0)

## 2011-12-20 LAB — CBC WITH DIFFERENTIAL/PLATELET
Basophils Absolute: 0 10*3/uL (ref 0.0–0.1)
Eosinophils Absolute: 0.1 10*3/uL (ref 0.0–0.7)
Lymphocytes Relative: 20.5 % (ref 12.0–46.0)
MCHC: 33.9 g/dL (ref 30.0–36.0)
Neutrophils Relative %: 70.8 % (ref 43.0–77.0)
Platelets: 217 10*3/uL (ref 150.0–400.0)
RBC: 5.37 Mil/uL (ref 4.22–5.81)
RDW: 13 % (ref 11.5–14.6)

## 2011-12-20 LAB — BASIC METABOLIC PANEL
CO2: 26 mEq/L (ref 19–32)
Calcium: 9.2 mg/dL (ref 8.4–10.5)
Creatinine, Ser: 0.9 mg/dL (ref 0.4–1.5)
Glucose, Bld: 100 mg/dL — ABNORMAL HIGH (ref 70–99)

## 2011-12-20 LAB — HEPATIC FUNCTION PANEL
AST: 29 U/L (ref 0–37)
Albumin: 4.6 g/dL (ref 3.5–5.2)
Alkaline Phosphatase: 54 U/L (ref 39–117)

## 2011-12-20 NOTE — Assessment & Plan Note (Signed)
The patient has a remote history of GI bleeding.  He has not been expressing any recent symptoms of GI bleed.  He takes Protonix on a when necessary basis only.

## 2011-12-20 NOTE — Assessment & Plan Note (Signed)
Patient has hypercholesterolemia.  He is on Lipitor.  He is not having any myalgias.  He has occasional cramps in his feet.  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

## 2011-12-20 NOTE — Patient Instructions (Addendum)
Your physician recommends that you continue on your current medications as directed. Please refer to the Current Medication list given to you today.  Your physician wants you to follow-up in: 4 months ov/ekg You will receive a reminder letter in the mail two months in advance. If you don't receive a letter, please call our office to schedule the follow-up appointment.  

## 2011-12-20 NOTE — Assessment & Plan Note (Signed)
The patient has not been experiencing any chest pain or shortness of breath.  Dizziness or syncope or palpitations.  He exercises regularly at home with steps and calisthenics.  He plays golf when the weather permits.

## 2011-12-20 NOTE — Progress Notes (Signed)
Ryan Lamb Date of Birth:  11/03/1955 [facility] [address] [place], [place]  [address] [phone]  Fax   [phone]  HPI: This pleasant 56 year old gentleman is seen for a scheduled four-month followup office visit. He has a history of previous severe aortic stenosis. He had a St. Jude aortic valve prosthesis placed in 1994.  The patient has a known moderately dilated descending aorta and this is being followed by Dr. Tyrone Sage. Noted on echo in November 2011. the patient has been on long-term Coumadin for his mechanical aortic valve prosthesis. He remains in normal sinus rhythm.   Current Outpatient Prescriptions  Medication Sig Dispense Refill  . aspirin 325 MG tablet Take 325 mg by mouth as needed.       Marland Kitchen atorvastatin (LIPITOR) 10 MG tablet Take 1 tablet (10 mg total) by mouth daily.  90 tablet  3  . MULTIPLE VITAMIN PO Take by mouth.        . pantoprazole (PROTONIX) 20 MG tablet Take 20 mg by mouth as needed.       . warfarin (COUMADIN) 5 MG tablet Take 1 tablet (5 mg total) by mouth as directed.  90 tablet  1    Allergies  Allergen Reactions  . Penicillins     Patient Active Problem List  Diagnosis  . Aortic valve prosthesis present  . Hypercholesterolemia  . Aortic valve disorders  . Heart valve replaced by other means  . Encounter for long-term (current) use of anticoagulants  . Ascending aortic aneurysm  . GERD (gastroesophageal reflux disease)    History  Smoking status  . Former Smoker -- 0.5 packs/day for 5 years  . Quit date: 10/17/1977  Smokeless tobacco  . Never Used    History  Alcohol Use No    No family history on file.  Review of Systems: The patient denies any heat or cold intolerance.  No weight gain or weight loss.  The patient denies headaches or blurry vision.  There is no cough or sputum production.  The patient denies dizziness.  There is no hematuria or hematochezia.  The patient denies any muscle  aches or arthritis.  The patient denies any rash.  The patient denies frequent falling or instability.  There is no history of depression or anxiety.  All other systems were reviewed and are negative.   Physical Exam: Filed Vitals:   12/20/11 0901  BP: 121/82  Pulse: 67  Resp: 18   general appearance reveals a well-developed well-nourished gentleman in no distress.The head and neck exam reveals pupils equal and reactive.  Extraocular movements are full.  There is no scleral icterus.  The mouth and pharynx are normal.  The neck is supple.  The carotids reveal no bruits.  The jugular venous pressure is normal.  The  thyroid is not enlarged.  There is no lymphadenopathy.  The chest is clear to percussion and auscultation.  There are no rales or rhonchi.  Expansion of the chest is symmetrical.  The precordium is quiet.  The first heart sound is normal.  The second heart sound is physiologically split.  The aortic closure sound is metallic and sharp.  There is a soft systolic ejection murmur across the prosthetic valve.  No diastolic murmur.  No gallop or rub.  There is no abnormal lift or heave.  The abdomen is soft and nontender.  The bowel sounds are normal.  The liver and spleen are not enlarged.  There are no abdominal  masses.  There are no abdominal bruits.  Extremities reveal good pedal pulses.  There is no phlebitis or edema.  There is no cyanosis or clubbing.  Strength is normal and symmetrical in all extremities.  There is no lateralizing weakness.  There are no sensory deficits.  The skin is warm and dry.  There is no rash.      Assessment / Plan: Continue on same medication.  Blood work today pending.  Check in 4 months for followup office visit and EKG.

## 2011-12-22 NOTE — Progress Notes (Signed)
Quick Note:  Please report to patient. The recent labs are stable. Continue same medication and careful diet. LDL cholesterol and blood sugar are higher. Continue same medication and continue strict diet ______

## 2011-12-23 ENCOUNTER — Telehealth: Payer: Self-pay | Admitting: *Deleted

## 2011-12-23 NOTE — Telephone Encounter (Signed)
Message copied by Burnell Blanks on Mon Dec 23, 2011  9:27 AM ------      Message from: Cassell Clement      Created: Sun Dec 22, 2011  1:30 PM       Please report to patient.  The recent labs are stable. Continue same medication and careful diet.  LDL cholesterol and blood sugar are higher.  Continue same medication and continue strict diet

## 2011-12-23 NOTE — Telephone Encounter (Signed)
Mailed copy of labs and left message to call if any questions  

## 2012-01-17 ENCOUNTER — Ambulatory Visit (INDEPENDENT_AMBULATORY_CARE_PROVIDER_SITE_OTHER): Payer: 59 | Admitting: *Deleted

## 2012-01-17 DIAGNOSIS — I359 Nonrheumatic aortic valve disorder, unspecified: Secondary | ICD-10-CM

## 2012-01-17 DIAGNOSIS — Z7901 Long term (current) use of anticoagulants: Secondary | ICD-10-CM

## 2012-01-17 DIAGNOSIS — Z954 Presence of other heart-valve replacement: Secondary | ICD-10-CM

## 2012-01-17 LAB — POCT INR: INR: 3.9

## 2012-02-10 ENCOUNTER — Other Ambulatory Visit: Payer: Self-pay

## 2012-02-10 MED ORDER — WARFARIN SODIUM 5 MG PO TABS: 5.0000 mg | ORAL_TABLET | ORAL | Status: DC

## 2012-02-14 ENCOUNTER — Ambulatory Visit (INDEPENDENT_AMBULATORY_CARE_PROVIDER_SITE_OTHER): Payer: 59 | Admitting: *Deleted

## 2012-02-14 DIAGNOSIS — I359 Nonrheumatic aortic valve disorder, unspecified: Secondary | ICD-10-CM

## 2012-02-14 DIAGNOSIS — Z954 Presence of other heart-valve replacement: Secondary | ICD-10-CM

## 2012-02-14 DIAGNOSIS — Z7901 Long term (current) use of anticoagulants: Secondary | ICD-10-CM

## 2012-02-14 LAB — POCT INR: INR: 3.4

## 2012-03-17 ENCOUNTER — Ambulatory Visit (INDEPENDENT_AMBULATORY_CARE_PROVIDER_SITE_OTHER): Payer: 59

## 2012-03-17 DIAGNOSIS — Z7901 Long term (current) use of anticoagulants: Secondary | ICD-10-CM

## 2012-03-17 DIAGNOSIS — Z954 Presence of other heart-valve replacement: Secondary | ICD-10-CM

## 2012-03-17 DIAGNOSIS — I359 Nonrheumatic aortic valve disorder, unspecified: Secondary | ICD-10-CM

## 2012-03-17 LAB — POCT INR: INR: 3.5

## 2012-04-03 ENCOUNTER — Other Ambulatory Visit: Payer: Self-pay | Admitting: *Deleted

## 2012-04-03 MED ORDER — ATORVASTATIN CALCIUM 10 MG PO TABS: 10.0000 mg | ORAL_TABLET | Freq: Every day | ORAL | Status: DC

## 2012-04-14 ENCOUNTER — Ambulatory Visit (INDEPENDENT_AMBULATORY_CARE_PROVIDER_SITE_OTHER): Payer: 59

## 2012-04-14 DIAGNOSIS — Z954 Presence of other heart-valve replacement: Secondary | ICD-10-CM

## 2012-04-14 DIAGNOSIS — I359 Nonrheumatic aortic valve disorder, unspecified: Secondary | ICD-10-CM

## 2012-04-14 DIAGNOSIS — Z7901 Long term (current) use of anticoagulants: Secondary | ICD-10-CM

## 2012-04-24 ENCOUNTER — Ambulatory Visit (INDEPENDENT_AMBULATORY_CARE_PROVIDER_SITE_OTHER): Payer: 59 | Admitting: *Deleted

## 2012-04-24 DIAGNOSIS — I359 Nonrheumatic aortic valve disorder, unspecified: Secondary | ICD-10-CM

## 2012-04-24 DIAGNOSIS — Z7901 Long term (current) use of anticoagulants: Secondary | ICD-10-CM

## 2012-04-24 DIAGNOSIS — Z954 Presence of other heart-valve replacement: Secondary | ICD-10-CM

## 2012-05-22 ENCOUNTER — Ambulatory Visit (INDEPENDENT_AMBULATORY_CARE_PROVIDER_SITE_OTHER): Payer: 59 | Admitting: *Deleted

## 2012-05-22 DIAGNOSIS — Z7901 Long term (current) use of anticoagulants: Secondary | ICD-10-CM

## 2012-05-22 DIAGNOSIS — Z954 Presence of other heart-valve replacement: Secondary | ICD-10-CM

## 2012-05-22 DIAGNOSIS — I359 Nonrheumatic aortic valve disorder, unspecified: Secondary | ICD-10-CM

## 2012-06-19 ENCOUNTER — Ambulatory Visit (INDEPENDENT_AMBULATORY_CARE_PROVIDER_SITE_OTHER): Payer: 59 | Admitting: *Deleted

## 2012-06-19 DIAGNOSIS — I359 Nonrheumatic aortic valve disorder, unspecified: Secondary | ICD-10-CM

## 2012-06-19 DIAGNOSIS — Z954 Presence of other heart-valve replacement: Secondary | ICD-10-CM

## 2012-06-19 DIAGNOSIS — Z7901 Long term (current) use of anticoagulants: Secondary | ICD-10-CM

## 2012-07-05 ENCOUNTER — Other Ambulatory Visit: Payer: Self-pay | Admitting: Cardiology

## 2012-07-31 ENCOUNTER — Ambulatory Visit (INDEPENDENT_AMBULATORY_CARE_PROVIDER_SITE_OTHER): Payer: 59 | Admitting: *Deleted

## 2012-07-31 DIAGNOSIS — Z954 Presence of other heart-valve replacement: Secondary | ICD-10-CM

## 2012-07-31 DIAGNOSIS — I359 Nonrheumatic aortic valve disorder, unspecified: Secondary | ICD-10-CM

## 2012-07-31 DIAGNOSIS — Z7901 Long term (current) use of anticoagulants: Secondary | ICD-10-CM

## 2012-08-10 ENCOUNTER — Ambulatory Visit (INDEPENDENT_AMBULATORY_CARE_PROVIDER_SITE_OTHER): Payer: 59 | Admitting: Cardiology

## 2012-08-10 ENCOUNTER — Encounter: Payer: Self-pay | Admitting: Cardiology

## 2012-08-10 VITALS — BP 132/80 | HR 78 | Ht 70.0 in | Wt 166.6 lb

## 2012-08-10 DIAGNOSIS — K219 Gastro-esophageal reflux disease without esophagitis: Secondary | ICD-10-CM

## 2012-08-10 DIAGNOSIS — Z954 Presence of other heart-valve replacement: Secondary | ICD-10-CM

## 2012-08-10 DIAGNOSIS — E7800 Pure hypercholesterolemia, unspecified: Secondary | ICD-10-CM

## 2012-08-10 DIAGNOSIS — Z952 Presence of prosthetic heart valve: Secondary | ICD-10-CM

## 2012-08-10 MED ORDER — PANTOPRAZOLE SODIUM 20 MG PO TBEC: 20.0000 mg | DELAYED_RELEASE_TABLET | ORAL | Status: DC | PRN

## 2012-08-10 NOTE — Progress Notes (Signed)
Ryan Lamb Date of Birth:  Aug 21, 1955 [facility] [address] [place], [place]  [address] [phone]  Fax   [phone]  HPI: This pleasant 57 year old gentleman is seen for a scheduled  followup office visit. He has a history of previous severe aortic stenosis. He had a St. Jude aortic valve prosthesis placed in 1994. The patient has a known moderately dilated descending aorta and this is being followed by Dr. Tyrone Sage. Noted on echo in November 2011. the patient has been on long-term Coumadin for his mechanical aortic valve prosthesis. He remains in normal sinus rhythm.  Since last visit the patient has had no new cardiac symptoms.   Current Outpatient Prescriptions  Medication Sig Dispense Refill  . aspirin 325 MG tablet Take 325 mg by mouth as needed.       Marland Kitchen atorvastatin (LIPITOR) 10 MG tablet Take 1 tablet (10 mg total) by mouth daily.  90 tablet  3  . MULTIPLE VITAMIN PO Take by mouth.        . pantoprazole (PROTONIX) 20 MG tablet Take 1 tablet (20 mg total) by mouth as needed.  30 tablet  6  . warfarin (COUMADIN) 5 MG tablet Take as directed by coumadin clinic  90 tablet  1   No current facility-administered medications for this visit.    Allergies  Allergen Reactions  . Penicillins     Patient Active Problem List   Diagnosis Date Noted  . GERD (gastroesophageal reflux disease) 04/22/2011  . Ascending aortic aneurysm 10/18/2010  . Heart valve replaced by other means 05/08/2010  . Encounter for long-term (current) use of anticoagulants 05/08/2010  . Aortic valve disorders 04/10/2010  . Aortic valve prosthesis present   . Hypercholesterolemia     History  Smoking status  . Former Smoker -- 0.50 packs/day for 5 years  . Quit date: 10/17/1977  Smokeless tobacco  . Never Used    History  Alcohol Use No    No family history on file.  Review of Systems: The patient denies any heat or cold intolerance.  No weight gain or  weight loss.  The patient denies headaches or blurry vision.  There is no cough or sputum production.  The patient denies dizziness.  There is no hematuria or hematochezia.  The patient denies any muscle aches or arthritis.  The patient denies any rash.  The patient denies frequent falling or instability.  There is no history of depression or anxiety.  All other systems were reviewed and are negative.   Physical Exam: Filed Vitals:   08/10/12 1619  BP: 132/80  Pulse: 78   general appearance reveals a well-developed well-nourished gentleman in no distress.The head and neck exam reveals pupils equal and reactive.  Extraocular movements are full.  There is no scleral icterus.  The mouth and pharynx are normal.  The neck is supple.  The carotids reveal no bruits.  The jugular venous pressure is normal.  The  thyroid is not enlarged.  There is no lymphadenopathy.  The chest is clear to percussion and auscultation.  There are no rales or rhonchi.  Expansion of the chest is symmetrical.  The precordium is quiet.  The first heart sound is normal.  The second heart sound is physiologically split.  There is no murmur gallop rub or click.  There is no abnormal lift or heave.  The abdomen is soft and nontender.  The bowel sounds are normal.  The liver and spleen are not enlarged.  There are no abdominal masses.  There are no abdominal bruits.  Extremities reveal good pedal pulses.  There is no phlebitis or edema.  There is no cyanosis or clubbing.  Strength is normal and symmetrical in all extremities.  There is no lateralizing weakness.  There are no sensory deficits.  The skin is warm and dry.  There is no rash.  EKG shows normal sinus rhythm with sinus arrhythmia and is unchanged since 04/22/11    Assessment / Plan: Continue on same medication.  Continue regular exercise.  Recheck 4 months for office visit and fasting lipid panel hepatic function panel and basal metabolic panel.

## 2012-08-10 NOTE — Patient Instructions (Addendum)
Your physician wants you to follow-up in: 4 months  You will receive a reminder letter in the mail two months in advance. If you don't receive a letter, please call our office to schedule the follow-up appointment.  Your physician recommends that you return for a FASTING lipid profile: 4 months come fasting and drink water.

## 2012-08-10 NOTE — Assessment & Plan Note (Signed)
The patient is on low-dose Lipitor for his hypercholesterolemia.  We will plan to check fasting lab work at his next visit.  He is not having any myalgias.

## 2012-08-10 NOTE — Assessment & Plan Note (Signed)
The patient has no history of any exertional chest pain.  He is not having any symptoms of CHF.  He has had no TIA symptoms.  He remains on long-term Coumadin.Marland Kitchen

## 2012-08-10 NOTE — Assessment & Plan Note (Signed)
Patient has a history of GERD.  He has had no recent severe symptoms.  He does have Protonix 20 mg on hand for when necessary use.  He has not been aware of any hematochezia or melena.

## 2012-09-03 ENCOUNTER — Other Ambulatory Visit: Payer: Self-pay | Admitting: *Deleted

## 2012-09-03 DIAGNOSIS — I712 Thoracic aortic aneurysm, without rupture: Secondary | ICD-10-CM

## 2012-09-16 ENCOUNTER — Encounter: Payer: Self-pay | Admitting: Cardiology

## 2012-09-21 ENCOUNTER — Ambulatory Visit (INDEPENDENT_AMBULATORY_CARE_PROVIDER_SITE_OTHER): Payer: 59

## 2012-09-21 DIAGNOSIS — Z954 Presence of other heart-valve replacement: Secondary | ICD-10-CM

## 2012-09-21 DIAGNOSIS — I359 Nonrheumatic aortic valve disorder, unspecified: Secondary | ICD-10-CM

## 2012-09-21 DIAGNOSIS — Z7901 Long term (current) use of anticoagulants: Secondary | ICD-10-CM

## 2012-10-08 ENCOUNTER — Encounter: Payer: Self-pay | Admitting: Cardiothoracic Surgery

## 2012-10-08 ENCOUNTER — Other Ambulatory Visit: Payer: Self-pay | Admitting: Cardiothoracic Surgery

## 2012-10-08 ENCOUNTER — Ambulatory Visit
Admission: RE | Admit: 2012-10-08 | Discharge: 2012-10-08 | Disposition: A | Discharge status: Home / Self Care | Payer: 59 | Source: Ambulatory Visit | Attending: Cardiothoracic Surgery | Admitting: Cardiothoracic Surgery

## 2012-10-08 ENCOUNTER — Ambulatory Visit (INDEPENDENT_AMBULATORY_CARE_PROVIDER_SITE_OTHER): Payer: 59 | Admitting: Cardiothoracic Surgery

## 2012-10-08 VITALS — BP 130/84 | HR 88 | Resp 18 | Ht 70.0 in | Wt 165.0 lb

## 2012-10-08 DIAGNOSIS — I712 Thoracic aortic aneurysm, without rupture: Secondary | ICD-10-CM

## 2012-10-08 MED ORDER — IOHEXOL 350 MG/ML SOLN
125.0000 mL | Freq: Once | INTRAVENOUS | Status: AC | PRN
  Administered 2012-10-08: 125 mL via INTRAVENOUS

## 2012-10-08 NOTE — Progress Notes (Signed)
[address]       [place] [address]             [phone]         [facility] Medical Record #[id number] Date of Birth: [birth year]  Referring: Cassell Clement, MD Primary Care: No primary provider on file.  Chief Complaint:    Chief Complaint  Patient presents with  . Follow-up    1 year f/u with CTA Chest    History of Present Illness:    Patient returns today with a followup CTA of the chest. In 1971 the patient had aortic valvuloplasty. Subsequently in 1994 I replaced his aortic valve with a #23 St. Jude mechanical prosthesis. In December of 2011 because of a chest x-ray suggestive of dilatation of his descending aorta he was seen again and has had CT scans of the chest in 2011, 2012,2013 and today. He has no family history of connective tissue or Marfan's disorder. He remains asymptomatic.    Current Activity/ Functional Status: Patient remains active without any physical limitations    Past Medical History  Diagnosis Date  . Aortic stenosis as infant, with recurrent as and valve replacement   . Aortic valve prosthesis present   . Hypercholesterolemia     Past Surgical History  Procedure Date  . Cardiac valve replacement 1994    No family history on file. There is no family history of Marfan's or other connective tissue disorder or sudden death from dissection .  History   Social History  . Marital Status: Single    Spouse Name: N/A    Number of Children: N/A  . Years of Education: N/A   Occupational History  . Not on file.   Social History Main Topics  . Smoking status: Former Smoker -- 0.5 packs/day for 5 years    Quit date: 10/17/1977  . Smokeless tobacco: Never Used  . Alcohol Use: No  . Drug Use: No              Social History Narrative  . No narrative on file    History  Smoking status  . Former Smoker -- 0.50 packs/day for 5 years  . Quit date: 10/17/1977  Smokeless  tobacco  . Never Used    History  Alcohol Use No     Allergies  Allergen Reactions  . Penicillins     Current Outpatient Prescriptions  Medication Sig Dispense Refill  . aspirin 325 MG tablet Take 325 mg by mouth as needed.       Marland Kitchen atorvastatin (LIPITOR) 10 MG tablet Take 1 tablet (10 mg total) by mouth daily.  90 tablet  3  . MULTIPLE VITAMIN PO Take by mouth.        . pantoprazole (PROTONIX) 20 MG tablet Take 1 tablet (20 mg total) by mouth as needed.  30 tablet  6  . warfarin (COUMADIN) 5 MG tablet Take as directed by coumadin clinic  90 tablet  1   No current facility-administered medications for this visit.       Review of Systems:     Cardiac Review of Systems: Y or N  Chest Pain [  n  ]  Resting SOB [n   ] Exertional SOB  [ n ]  Orthopnea [  n]   Pedal Edema [   n]  Palpitations [ n ] Syncope  [ n ]   Presyncope [ n  ]  General Review of Systems: [Y] = yes [  ]=no Constitional: recent weight change [n  ]; anorexia [  ]; fatigue [  ]; nausea [  ]; night sweats [  ]; fever [  ]; or chills [  ];                                                                                                                                          Dental: poor dentition[n  ]; Last Dentist visit: yearly  Eye : blurred vision [  ]; diplopia [   ]; vision changes [  ];  Amaurosis fugax[  ]; Resp: cough [  ];  wheezing[  ];  hemoptysis[  ]; shortness of breath[  ]; paroxysmal nocturnal dyspnea[  ]; dyspnea on exertion[  ]; or orthopnea[  ];  GI:  gallstones[  ], vomiting[  ];  dysphagia[  ]; melena[ in past pylop removed ];  hematochezia [  ]; heartburn[  ];   Hx of  Colonoscopy[ y ]; GU: kidney stones [  ]; hematuria[  ];   dysuria [  ];  nocturia[  ];  history of     obstruction [  ];             Skin: rash, swelling[  ];, hair loss[  ];  peripheral edema[  ];  or itching[  ]; Musculosketetal: myalgias[  ];  joint swelling[  ];  joint erythema[  ];  joint pain[  ];  back pain[   ];  Heme/Lymph: bruising[on coumadin  ];  bleeding[  ];  anemia[  ];  Neuro: TIA[ n ];  headaches[  ];  stroke[  ];  vertigo[  ];  seizures[  ];   paresthesias[  ];  difficulty walking[  ];  Psych:depression[  ]; anxiety[  ];  Endocrine: diabetes[  ];  thyroid dysfunction[  ];  Immunizations: Flu [  ]; Pneumococcal[  ];  Other:  Physical Exam: BP 130/84  Pulse 88  Resp 18  Ht 5\' 10"  (1.778 m)  Wt 165 lb (74.844 kg)  BMI 23.68 kg/m2  SpO2 97%  General appearance: alert, cooperative and appears stated age Neurologic: intact Heart: normal apical impulse and crisp mechanical valve sound s Lungs: clear to auscultation bilaterally and normal percussion bilaterally Abdomen: soft, non-tender; bowel sounds normal; no masses,  no organomegaly Extremities: extremities normal, atraumatic, no cyanosis or edema and Homans sign is negative, no sign of DVT Wound: stable sternum  Diagnostic Studies & Laboratory data:     Recent Radiology Findings:   Ct Angio Chest Aorta W/cm &/or Wo/cm  10/08/2012   CLINICAL DATA:  Thoracic aortic aneurysm, aortic stenosis, post mechanical prosthesis placement.  EXAM: CT ANGIOGRAPHY OF CHEST  TECHNIQUE: Multidetector CT imaging of the chest was performed  during bolus injection of intravenous contrast. Multiplanar CT angiographic image reconstructions including MIPs were also generated to evaluate the vascular anatomy.  CONTRAST:  OMNIPAQUE IOHEXOL 350 MG/ML SOLN  COMPARISON:  09/12/2011  FINDINGS: Stable appearance of prosthetic aortic valve. Postop change of the aortic root which is ectatic measuring 4.4 cm maximum transverse diameter. Proximal ascending 4.8 cm, distal ascending 4.7 cm, proximal arch 3.7 cm, distal arch 2.8 cm, proximal descending 2.6 cm, and distal descending 2.1 cm just above the diaphragm. Negative for dissection or stenosis. No significant atheromatous irregularity. 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 There is fairly good contrast opacification of the pulmonary arterial tree; exam was not optimized for detection of pulmonary emboli.  Previous median sternotomy. No pleural or pericardial effusion. Patchy coronary calcifications. No hilar or mediastinal adenopathy. Lungs are clear. Stable cyst In the upper pole left kidney. Remainder visualized upper abdomen unremarkable. thoracic spine intact.  IMPRESSION: IMPRESSION  1. Stable 4.8 cm ascending aortic aneurysm without dissection or other complicating features   Electronically Signed   By: Oley Balm M.D.   On: 10/08/2012 14:37    Recent Lab Findings: Lab Results  Component Value Date   WBC 6.4 12/20/2011   HGB 16.1 12/20/2011   HCT 47.5 12/20/2011   PLT 217.0 12/20/2011   GLUCOSE 100* 12/20/2011   CHOL 176 12/20/2011   TRIG 101.0 12/20/2011   HDL 47.70 12/20/2011   LDLCALC 108* 12/20/2011   ALT 28 12/20/2011   AST 29 12/20/2011   NA 138 12/20/2011   K 3.8 12/20/2011   CL 104 12/20/2011   CREATININE 0.9 12/20/2011   BUN 12 12/20/2011   CO2 26 12/20/2011   INR 2.5 09/21/2012   Aortic Size Index=    4.8     /Body surface area is 1.92 meters squared. = 2.5  < 2.75 cm/m2      4% risk per year 2.75 to 4.25          8% risk per year > 4.25 cm/m2    20% risk per year  cross sectional area of aorta cm2/height in meters  18.09/1.778=10  Assessment / Plan:    No change in 4.8 cm ascending aortic dilatation in patient with previous mechanic aortic valve replacement. There's been no appreciable change in the size of the aorta since his scan in 2011. I discussed the diagnosis with the patient and we have decided to continue to monitor the size of his aorta and will obtain a followup CT scan in one year.   Delight Ovens MD  Beeper [phone] Office [phone] 10/08/2012 2:43 PM

## 2012-11-02 ENCOUNTER — Ambulatory Visit (INDEPENDENT_AMBULATORY_CARE_PROVIDER_SITE_OTHER): Payer: 59 | Admitting: *Deleted

## 2012-11-02 DIAGNOSIS — Z954 Presence of other heart-valve replacement: Secondary | ICD-10-CM

## 2012-11-02 DIAGNOSIS — Z7901 Long term (current) use of anticoagulants: Secondary | ICD-10-CM

## 2012-11-02 DIAGNOSIS — I359 Nonrheumatic aortic valve disorder, unspecified: Secondary | ICD-10-CM

## 2012-11-02 LAB — POCT INR: INR: 3

## 2012-11-25 ENCOUNTER — Other Ambulatory Visit: Payer: Self-pay | Admitting: Cardiology

## 2012-12-08 ENCOUNTER — Encounter: Payer: Self-pay | Admitting: Cardiology

## 2012-12-08 ENCOUNTER — Ambulatory Visit (INDEPENDENT_AMBULATORY_CARE_PROVIDER_SITE_OTHER): Payer: 59 | Admitting: Cardiology

## 2012-12-08 ENCOUNTER — Ambulatory Visit (INDEPENDENT_AMBULATORY_CARE_PROVIDER_SITE_OTHER): Payer: 59 | Admitting: *Deleted

## 2012-12-08 VITALS — BP 112/74 | HR 92 | Ht 70.0 in | Wt 164.0 lb

## 2012-12-08 DIAGNOSIS — I359 Nonrheumatic aortic valve disorder, unspecified: Secondary | ICD-10-CM

## 2012-12-08 DIAGNOSIS — Z954 Presence of other heart-valve replacement: Secondary | ICD-10-CM

## 2012-12-08 DIAGNOSIS — Z7901 Long term (current) use of anticoagulants: Secondary | ICD-10-CM

## 2012-12-08 DIAGNOSIS — Z952 Presence of prosthetic heart valve: Secondary | ICD-10-CM

## 2012-12-08 DIAGNOSIS — E7800 Pure hypercholesterolemia, unspecified: Secondary | ICD-10-CM

## 2012-12-08 LAB — BASIC METABOLIC PANEL WITH GFR
BUN: 15 mg/dL (ref 6–23)
CO2: 24 meq/L (ref 19–32)
Calcium: 9.3 mg/dL (ref 8.4–10.5)
Chloride: 106 meq/L (ref 96–112)
Creatinine, Ser: 0.9 mg/dL (ref 0.4–1.5)
GFR: 89.27 mL/min
Glucose, Bld: 89 mg/dL (ref 70–99)
Potassium: 4.5 meq/L (ref 3.5–5.1)
Sodium: 139 meq/L (ref 135–145)

## 2012-12-08 LAB — HEPATIC FUNCTION PANEL
ALT: 23 U/L (ref 0–53)
Albumin: 4.4 g/dL (ref 3.5–5.2)
Total Protein: 7.2 g/dL (ref 6.0–8.3)

## 2012-12-08 LAB — LIPID PANEL
Cholesterol: 180 mg/dL (ref 0–200)
HDL: 46 mg/dL (ref >39.00)
LDL Cholesterol: 112 mg/dL — ABNORMAL HIGH (ref 0–99)
Total CHOL/HDL Ratio: 4
Triglycerides: 108 mg/dL (ref 0.0–149.0)
VLDL: 21.6 mg/dL (ref 0.0–40.0)

## 2012-12-08 NOTE — Assessment & Plan Note (Signed)
The patient is on long-term Coumadin.  He has not been experiencing any symptoms of CHF.  His energy level is good.  No chest pain.  No palpitations.

## 2012-12-08 NOTE — Progress Notes (Signed)
Ryan Lamb Date of Birth:  04/21/1955 [address] [place], [place]  [address] [phone]  Fax   [phone]  HPI: This pleasant 57 year old gentleman is seen for a scheduled  followup office visit. He has a history of previous severe aortic stenosis. He had a St. Jude aortic valve prosthesis placed in 1994. The patient has a known moderately dilated descending aorta and this is being followed by Dr. Tyrone Sage. Noted on echo in November 2011. the patient has been on long-term Coumadin for his mechanical aortic valve prosthesis. He remains in normal sinus rhythm.  Since last visit the patient has had no new cardiac symptoms.   Current Outpatient Prescriptions  Medication Sig Dispense Refill  . atorvastatin (LIPITOR) 10 MG tablet Take 1 tablet (10 mg total) by mouth daily.  90 tablet  3  . MULTIPLE VITAMIN PO Take by mouth.        . pantoprazole (PROTONIX) 20 MG tablet Take 1 tablet (20 mg total) by mouth as needed.  30 tablet  6  . warfarin (COUMADIN) 5 MG tablet TAKE AS DIRECTED BY COUMADIN CLINIC  90 tablet  1  . aspirin 325 MG tablet Take 325 mg by mouth as needed.        No current facility-administered medications for this visit.    Allergies  Allergen Reactions  . Penicillins     Patient Active Problem List   Diagnosis Date Noted  . GERD (gastroesophageal reflux disease) 04/22/2011  . Ascending aortic aneurysm 10/18/2010  . Heart valve replaced by other means 05/08/2010  . Encounter for long-term (current) use of anticoagulants 05/08/2010  . Aortic valve disorders 04/10/2010  . Aortic valve prosthesis present   . Hypercholesterolemia     History  Smoking status  . Former Smoker -- 0.50 packs/day for 5 years  . Quit date: 10/17/1977  Smokeless tobacco  . Never Used    History  Alcohol Use No    No family history on file.  Review of Systems: The patient denies any heat or cold intolerance.  No weight gain or weight loss.  The  patient denies headaches or blurry vision.  There is no cough or sputum production.  The patient denies dizziness.  There is no hematuria or hematochezia.  The patient denies any muscle aches or arthritis.  The patient denies any rash.  The patient denies frequent falling or instability.  There is no history of depression or anxiety.  All other systems were reviewed and are negative.    Physical Exam: Filed Vitals:   12/08/12 1119  BP: 112/74  Pulse: 92   general appearance reveals a well-developed well-nourished gentleman in no distress.The head and neck exam reveals pupils equal and reactive.  Extraocular movements are full.  There is no scleral icterus.  The mouth and pharynx are normal.  The neck is supple.  The carotids reveal no bruits.  The jugular venous pressure is normal.  The  thyroid is not enlarged.  There is no lymphadenopathy.  The chest is clear to percussion and auscultation.  There are no rales or rhonchi.  Expansion of the chest is symmetrical.  The precordium is quiet.  The first heart sound is normal.  The second heart sound is physiologically split.  There is no murmur gallop rub or click.  There is no abnormal lift or heave.  The abdomen is soft and nontender.  The bowel sounds are normal.  The liver and spleen are not  enlarged.  There are no abdominal masses.  There are no abdominal bruits.  Extremities reveal good pedal pulses.  There is no phlebitis or edema.  There is no cyanosis or clubbing.  Strength is normal and symmetrical in all extremities.  There is no lateralizing weakness.  There are no sensory deficits.  The skin is warm and dry.  There is no rash.      Assessment / Plan: Continue on same medication.  Continue regular exercise.  Recheck 4 months for office visit .

## 2012-12-08 NOTE — Assessment & Plan Note (Signed)
Patient has a history of hypercholesterolemia and is on Lipitor 10 mg.  He is fasting today and we are getting fasting lab work.  He is not having any myalgias or side effects

## 2012-12-08 NOTE — Patient Instructions (Signed)
Will obtain labs today and call you with the results (lp/bmet/hfp)  Your physician recommends that you continue on your current medications as directed. Please refer to the Current Medication list given to you today.  Your physician recommends that you schedule a follow-up appointment in: 4 month ov 

## 2012-12-08 NOTE — Progress Notes (Signed)
Quick Note:  Please report to patient. The recent labs are stable. Continue same medication and careful diet. ______ 

## 2012-12-09 ENCOUNTER — Telehealth: Payer: Self-pay | Admitting: *Deleted

## 2012-12-09 NOTE — Telephone Encounter (Signed)
Advised patient of lab results  

## 2012-12-09 NOTE — Telephone Encounter (Signed)
Message copied by Burnell Blanks on Wed Dec 09, 2012 12:08 PM ------      Message from: Cassell Clement      Created: Tue Dec 08, 2012  8:45 PM       Please report to patient.  The recent labs are stable. Continue same medication and careful diet. ------

## 2013-01-19 ENCOUNTER — Ambulatory Visit (INDEPENDENT_AMBULATORY_CARE_PROVIDER_SITE_OTHER): Payer: 59 | Admitting: Pharmacist

## 2013-01-19 DIAGNOSIS — Z954 Presence of other heart-valve replacement: Secondary | ICD-10-CM

## 2013-01-19 DIAGNOSIS — I359 Nonrheumatic aortic valve disorder, unspecified: Secondary | ICD-10-CM

## 2013-01-19 DIAGNOSIS — Z7901 Long term (current) use of anticoagulants: Secondary | ICD-10-CM

## 2013-01-19 LAB — POCT INR: INR: 2

## 2013-03-02 ENCOUNTER — Other Ambulatory Visit: Payer: Self-pay | Admitting: Cardiology

## 2013-03-09 ENCOUNTER — Ambulatory Visit (INDEPENDENT_AMBULATORY_CARE_PROVIDER_SITE_OTHER): Payer: 59 | Admitting: Pharmacist

## 2013-03-09 DIAGNOSIS — Z954 Presence of other heart-valve replacement: Secondary | ICD-10-CM

## 2013-03-09 DIAGNOSIS — Z7901 Long term (current) use of anticoagulants: Secondary | ICD-10-CM

## 2013-03-09 DIAGNOSIS — I359 Nonrheumatic aortic valve disorder, unspecified: Secondary | ICD-10-CM

## 2013-03-09 LAB — POCT INR: INR: 2.7

## 2013-03-30 ENCOUNTER — Ambulatory Visit (INDEPENDENT_AMBULATORY_CARE_PROVIDER_SITE_OTHER): Payer: 59 | Admitting: Pharmacist Clinician (PhC)/ Clinical Pharmacy Specialist

## 2013-03-30 DIAGNOSIS — Z954 Presence of other heart-valve replacement: Secondary | ICD-10-CM

## 2013-03-30 DIAGNOSIS — Z7901 Long term (current) use of anticoagulants: Secondary | ICD-10-CM

## 2013-03-30 DIAGNOSIS — I359 Nonrheumatic aortic valve disorder, unspecified: Secondary | ICD-10-CM

## 2013-03-30 LAB — POCT INR: INR: 3.1

## 2013-04-16 ENCOUNTER — Other Ambulatory Visit: Payer: Self-pay | Admitting: Cardiology

## 2013-04-20 ENCOUNTER — Other Ambulatory Visit: Payer: Self-pay | Admitting: *Deleted

## 2013-04-20 MED ORDER — WARFARIN SODIUM 5 MG PO TABS: ORAL_TABLET | ORAL | Status: DC

## 2013-04-21 ENCOUNTER — Other Ambulatory Visit: Payer: Self-pay | Admitting: *Deleted

## 2013-04-21 MED ORDER — WARFARIN SODIUM 5 MG PO TABS
ORAL_TABLET | ORAL | Status: DC
Start: 2013-04-21 — End: 2013-04-23

## 2013-04-23 ENCOUNTER — Other Ambulatory Visit: Payer: Self-pay | Admitting: *Deleted

## 2013-04-23 MED ORDER — WARFARIN SODIUM 5 MG PO TABS: ORAL_TABLET | ORAL | Status: DC

## 2013-04-29 ENCOUNTER — Ambulatory Visit (INDEPENDENT_AMBULATORY_CARE_PROVIDER_SITE_OTHER): Payer: 59 | Admitting: Cardiology

## 2013-04-29 ENCOUNTER — Encounter: Payer: Self-pay | Admitting: Cardiology

## 2013-04-29 ENCOUNTER — Ambulatory Visit (INDEPENDENT_AMBULATORY_CARE_PROVIDER_SITE_OTHER): Payer: 59

## 2013-04-29 VITALS — BP 110/96 | HR 78 | Ht 70.0 in | Wt 163.1 lb

## 2013-04-29 DIAGNOSIS — E7800 Pure hypercholesterolemia, unspecified: Secondary | ICD-10-CM

## 2013-04-29 DIAGNOSIS — I712 Thoracic aortic aneurysm, without rupture, unspecified: Secondary | ICD-10-CM

## 2013-04-29 DIAGNOSIS — I7121 Aneurysm of the ascending aorta, without rupture: Secondary | ICD-10-CM

## 2013-04-29 DIAGNOSIS — Z954 Presence of other heart-valve replacement: Secondary | ICD-10-CM

## 2013-04-29 DIAGNOSIS — Z7901 Long term (current) use of anticoagulants: Secondary | ICD-10-CM

## 2013-04-29 DIAGNOSIS — Z952 Presence of prosthetic heart valve: Secondary | ICD-10-CM

## 2013-04-29 DIAGNOSIS — I359 Nonrheumatic aortic valve disorder, unspecified: Secondary | ICD-10-CM

## 2013-04-29 LAB — POCT INR: INR: 3.6

## 2013-04-29 NOTE — Assessment & Plan Note (Signed)
Patient has hypercholesterolemia and is on Lipitor.  We will plan to recheck his fasting lipids at his next visit.  He is not having any myalgias.

## 2013-04-29 NOTE — Patient Instructions (Signed)
Your physician recommends that you continue on your current medications as directed. Please refer to the Current Medication list given to you today.  Your physician wants you to follow-up in: 4 months with fasting labs (lp/bmet/hfp) AND EKG You will receive a reminder letter in the mail two months in advance. If you don't receive a letter, please call our office to schedule the follow-up appointment.  

## 2013-04-29 NOTE — Assessment & Plan Note (Signed)
No chest pain or interscapular pain

## 2013-04-29 NOTE — Assessment & Plan Note (Signed)
Exercise tolerance is normal.  He goes to the gym and works out with [employer] weights.  He does not do any heavy lifting.  Is not having any exertional chest pain or shortness of breath or palpitations.

## 2013-04-29 NOTE — Progress Notes (Signed)
Ryan Lamb Date of Birth:  01/13/1955 [address] [place], [place]  [id number] [phone]  Fax   [phone]  HPI: This pleasant 58 year old gentleman is seen for a scheduled  followup office visit. He has a history of previous severe aortic stenosis. He had a St. Jude aortic valve prosthesis placed in 1994. The patient has a known moderately dilated descending aorta and this is being followed by Dr. Tyrone SageGerhardt. Noted on echo in November 2011. the patient has been on long-term Coumadin for his mechanical aortic valve prosthesis. He remains in normal sinus rhythm.  Since last visit the patient has had no new cardiac symptoms.  He has a remote history of GI bleeding but no recurrence.  He is not having any symptoms from his descending aortic aneurysm.   Current Outpatient Prescriptions  Medication Sig Dispense Refill  . aspirin 325 MG tablet Take 325 mg by mouth as needed.       Marland Kitchen. atorvastatin (LIPITOR) 10 MG tablet TAKE 1 TABLET DAILY  90 tablet  0  . MULTIPLE VITAMIN PO Take by mouth.        . pantoprazole (PROTONIX) 20 MG tablet Take 1 tablet (20 mg total) by mouth as needed.  30 tablet  6  . warfarin (COUMADIN) 5 MG tablet 1 tablet every day except 1/2 tablet on Sundays and Thursdays or as directed by coumadin clinic  90 tablet  1   No current facility-administered medications for this visit.    Allergies  Allergen Reactions  . Penicillins     Patient Active Problem List   Diagnosis Date Noted  . GERD (gastroesophageal reflux disease) 04/22/2011  . Ascending aortic aneurysm 10/18/2010  . Heart valve replaced by other means 05/08/2010  . Encounter for long-term (current) use of anticoagulants 05/08/2010  . Aortic valve disorders 04/10/2010  . Aortic valve prosthesis present   . Hypercholesterolemia     History  Smoking status  . Former Smoker -- 0.50 packs/day for 5 years  . Quit date: 10/17/1977  Smokeless tobacco  . Never Used     History  Alcohol Use No    No family history on file.  Review of Systems: The patient denies any heat or cold intolerance.  No weight gain or weight loss.  The patient denies headaches or blurry vision.  There is no cough or sputum production.  The patient denies dizziness.  There is no hematuria or hematochezia.  The patient denies any muscle aches or arthritis.  The patient denies any rash.  The patient denies frequent falling or instability.  There is no history of depression or anxiety.  All other systems were reviewed and are negative.    Physical Exam: Filed Vitals:   04/29/13 0931  BP: 110/96  Pulse: 78   general appearance reveals a well-developed well-nourished gentleman in no distress.The head and neck exam reveals pupils equal and reactive.  Extraocular movements are full.  There is no scleral icterus.  The mouth and pharynx are normal.  The neck is supple.  The carotids reveal no bruits.  The jugular venous pressure is normal.  The  thyroid is not enlarged.  There is no lymphadenopathy.  The chest is clear to percussion and auscultation.  There are no rales or rhonchi.  Expansion of the chest is symmetrical.  The precordium is quiet.  The first heart sound is normal.  The second heart sound is physiologically split.  There is no murmur gallop  rub or click.  There is no abnormal lift or heave.  The abdomen is soft and nontender.  The bowel sounds are normal.  The liver and spleen are not enlarged.  There are no abdominal masses.  There are no abdominal bruits.  Extremities reveal good pedal pulses.  There is no phlebitis or edema.  There is no cyanosis or clubbing.  Strength is normal and symmetrical in all extremities.  There is no lateralizing weakness.  There are no sensory deficits.  The skin is warm and dry.  There is no rash.      Assessment / Plan: Continue on same medication.  Continue regular exercise.  Recheck 4 months for office visit EKG and fasting lipid panel  hepatic function panel and basal metabolic panel.

## 2013-05-13 ENCOUNTER — Other Ambulatory Visit: Payer: Self-pay | Admitting: Cardiology

## 2013-07-23 ENCOUNTER — Other Ambulatory Visit: Payer: Self-pay | Admitting: Cardiology

## 2013-09-11 ENCOUNTER — Other Ambulatory Visit: Payer: Self-pay | Admitting: Cardiology

## 2013-09-16 ENCOUNTER — Ambulatory Visit (INDEPENDENT_AMBULATORY_CARE_PROVIDER_SITE_OTHER): Payer: 59 | Admitting: *Deleted

## 2013-09-16 DIAGNOSIS — Z954 Presence of other heart-valve replacement: Secondary | ICD-10-CM

## 2013-09-16 DIAGNOSIS — Z7901 Long term (current) use of anticoagulants: Secondary | ICD-10-CM

## 2013-09-16 DIAGNOSIS — I359 Nonrheumatic aortic valve disorder, unspecified: Secondary | ICD-10-CM

## 2013-09-16 LAB — POCT INR: INR: 3

## 2013-09-21 ENCOUNTER — Other Ambulatory Visit: Payer: Self-pay | Admitting: *Deleted

## 2013-09-21 DIAGNOSIS — I7121 Aneurysm of the ascending aorta, without rupture: Secondary | ICD-10-CM

## 2013-09-21 DIAGNOSIS — I712 Thoracic aortic aneurysm, without rupture: Secondary | ICD-10-CM

## 2013-10-02 ENCOUNTER — Other Ambulatory Visit: Payer: Self-pay | Admitting: Cardiology

## 2013-10-25 ENCOUNTER — Ambulatory Visit (INDEPENDENT_AMBULATORY_CARE_PROVIDER_SITE_OTHER): Payer: 59

## 2013-10-25 ENCOUNTER — Other Ambulatory Visit (INDEPENDENT_AMBULATORY_CARE_PROVIDER_SITE_OTHER): Payer: 59 | Admitting: *Deleted

## 2013-10-25 DIAGNOSIS — Z7901 Long term (current) use of anticoagulants: Secondary | ICD-10-CM

## 2013-10-25 DIAGNOSIS — I359 Nonrheumatic aortic valve disorder, unspecified: Secondary | ICD-10-CM

## 2013-10-25 DIAGNOSIS — E7800 Pure hypercholesterolemia, unspecified: Secondary | ICD-10-CM

## 2013-10-25 DIAGNOSIS — Z952 Presence of prosthetic heart valve: Secondary | ICD-10-CM

## 2013-10-25 DIAGNOSIS — Z954 Presence of other heart-valve replacement: Secondary | ICD-10-CM

## 2013-10-25 LAB — HEPATIC FUNCTION PANEL
ALBUMIN: 3.7 g/dL (ref 3.5–5.2)
ALT: 22 U/L (ref 0–53)
AST: 23 U/L (ref 0–37)
Alkaline Phosphatase: 49 U/L (ref 39–117)
BILIRUBIN DIRECT: 0 mg/dL (ref 0.0–0.3)
Total Bilirubin: 0.9 mg/dL (ref 0.2–1.2)
Total Protein: 7.1 g/dL (ref 6.0–8.3)

## 2013-10-25 LAB — LIPID PANEL
Cholesterol: 175 mg/dL (ref 0–200)
HDL: 39.3 mg/dL (ref >39.00)
LDL CALC: 111 mg/dL — AB (ref 0–99)
NonHDL: 135.7
TRIGLYCERIDES: 126 mg/dL (ref 0.0–149.0)
Total CHOL/HDL Ratio: 4
VLDL: 25.2 mg/dL (ref 0.0–40.0)

## 2013-10-25 LAB — BASIC METABOLIC PANEL
BUN: 13 mg/dL (ref 6–23)
CHLORIDE: 105 meq/L (ref 96–112)
CO2: 27 mEq/L (ref 19–32)
Calcium: 9.1 mg/dL (ref 8.4–10.5)
Creatinine, Ser: 1 mg/dL (ref 0.4–1.5)
GFR: 83.77 mL/min (ref >60.00)
Glucose, Bld: 90 mg/dL (ref 70–99)
Potassium: 3.6 mEq/L (ref 3.5–5.1)
Sodium: 141 mEq/L (ref 135–145)

## 2013-10-25 LAB — POCT INR: INR: 3

## 2013-10-25 NOTE — Progress Notes (Signed)
Quick Note:  Please make copy of labs for patient visit. ______ 

## 2013-10-28 ENCOUNTER — Ambulatory Visit (INDEPENDENT_AMBULATORY_CARE_PROVIDER_SITE_OTHER): Payer: 59 | Admitting: Cardiothoracic Surgery

## 2013-10-28 ENCOUNTER — Encounter: Payer: Self-pay | Admitting: Cardiothoracic Surgery

## 2013-10-28 ENCOUNTER — Ambulatory Visit
Admission: RE | Admit: 2013-10-28 | Discharge: 2013-10-28 | Disposition: A | Discharge status: Home / Self Care | Payer: 59 | Source: Ambulatory Visit | Attending: Cardiothoracic Surgery | Admitting: Cardiothoracic Surgery

## 2013-10-28 VITALS — BP 114/79 | HR 89 | Ht 70.0 in | Wt 162.0 lb

## 2013-10-28 DIAGNOSIS — I712 Thoracic aortic aneurysm, without rupture: Secondary | ICD-10-CM

## 2013-10-28 DIAGNOSIS — I7121 Aneurysm of the ascending aorta, without rupture: Secondary | ICD-10-CM

## 2013-10-28 MED ORDER — IOHEXOL 350 MG/ML SOLN
80.0000 mL | Freq: Once | INTRAVENOUS | Status: AC | PRN
  Administered 2013-10-28: 80 mL via INTRAVENOUS

## 2013-10-28 NOTE — Progress Notes (Signed)
[address]       [place] [id number]             [phone]         Ryan AltesHarold E Lachapelle Lamb [place] Medical Record #[id number] Date of Birth: 1955-07-28  Referring: Ryan ClementBrackbill, Thomas, MD Primary Care: Ryan Lamb, Provider, MD  Chief Complaint:    Chief Complaint  Patient presents with  . OV CARDIAC    1 YR CTA CHEST    History of Present Illness:    Patient returns today with a followup CTA of the chest. In 1971 the patient had aortic valvuloplasty. Subsequently in 1994 I replaced his aortic valve with a #23 St. Jude mechanical prosthesis. In December of 2011 because of a chest x-ray suggestive of dilatation of his descending aorta he was seen again and has had CT scans of the chest in 2011, 2012,2013, 2014  and today. He has no family history of connective tissue or Marfan's disorder. He remains asymptomatic. He notes he follows his blood pressure carefully and has not been hypertensive. He continues to follow endocarditis precautions especially with dental work.    Current Activity/ Functional Status: Patient remains active without any physical limitations    Past Medical History  Diagnosis Date  . Aortic stenosis as infant, with recurrent as and valve replacement   . Aortic valve prosthesis present   . Hypercholesterolemia     Past Surgical History  Procedure Date  . Cardiac valve replacement- mechanical aortic valve  1994     There is no family history of Marfan's or other connective tissue disorder or sudden death from dissection .  History   Social History  . Marital Status: Single    Spouse Name: N/A    Number of Children: N/A  . Years of Education: N/A   Occupational History  . Not on file.   Social History Main Topics  . Smoking status: Former Smoker -- 0.5 packs/day for 5 years    Quit date: 10/17/1977  . Smokeless tobacco: Never Used  . Alcohol Use: No  . Drug Use: No              Social History  Narrative  . No narrative on file    History  Smoking status  . Former Smoker -- 0.50 packs/day for 5 years  . Quit date: 10/17/1977  Smokeless tobacco  . Never Used    History  Alcohol Use No     Allergies  Allergen Reactions  . Penicillins     Current Outpatient Prescriptions  Medication Sig Dispense Refill  . aspirin 325 MG tablet Take 325 mg by mouth as needed.       Marland Kitchen. atorvastatin (LIPITOR) 10 MG tablet TAKE 1 TABLET DAILY (PATIENT NEEDS APPOINTMENT)  90 tablet  0  . MULTIPLE VITAMIN PO Take by mouth.        . pantoprazole (PROTONIX) 20 MG tablet Take 1 tablet (20 mg total) by mouth as needed.  30 tablet  6  . warfarin (COUMADIN) 5 MG tablet Take as directed by Coumadin clinic  90 tablet  1   No current facility-administered medications for this visit.       Review of Systems:     Cardiac Review of Systems: Y or N  Chest Pain [  n  ]  Resting SOB [n   ]  Exertional SOB  [ n ]  Orthopnea [  n]   Pedal Edema [   n]    Palpitations [ n ] Syncope  [ n ]   Presyncope [ n  ]  General Review of Systems: [Y] = yes [  ]=no Constitional: recent weight change [n  ]; anorexia [  ]; fatigue [  ]; nausea [  ]; night sweats [  ]; fever [  ]; or chills [  ];                                                                                                                                          Dental: poor dentition[n  ]; Last Dentist visit: yearly  Eye : blurred vision [  ]; diplopia [   ]; vision changes [  ];  Amaurosis fugax[  ]; Resp: cough [  ];  wheezing[  ];  hemoptysis[  ]; shortness of breath[  ]; paroxysmal nocturnal dyspnea[  ]; dyspnea on exertion[  ]; or orthopnea[  ];  GI:  gallstones[  ], vomiting[  ];  dysphagia[  ]; melena[ in past pylop removed ];  hematochezia [  ]; heartburn[  ];   Hx of  Colonoscopy[ y ]; GU: kidney stones [  ]; hematuria[  ];   dysuria [  ];  nocturia[  ];  history of     obstruction [  ];             Skin: rash, swelling[  ];, hair loss[  ];   peripheral edema[  ];  or itching[  ]; Musculosketetal: myalgias[  ];  joint swelling[  ];  joint erythema[  ];  joint pain[  ];  back pain[  ];  Heme/Lymph: bruising[on coumadin  ];  bleeding[  ];  anemia[  ];  Neuro: TIA[ n ];  headaches[  ];  stroke[  ];  vertigo[  ];  seizures[  ];   paresthesias[  ];  difficulty walking[  ];  Psych:depression[  ]; anxiety[  ];  Endocrine: diabetes[  ];  thyroid dysfunction[  ];  Immunizations: Flu [ y ]; Pneumococcal[n  ];  Other:  Physical Exam: BP 114/79  Pulse 89  Ht 5\' 10"  (1.778 m)  Wt 162 lb (73.483 kg)  BMI 23.24 kg/m2  SpO2 98%  General appearance: alert, cooperative and appears stated age Neurologic: intact Heart: normal apical impulse and crisp mechanical valve sound no murmur of aortic insufficiency Lungs: clear to auscultation bilaterally and normal percussion bilaterally Abdomen: soft, non-tender; bowel sounds normal; no masses,  no organomegaly Extremities: extremities normal, atraumatic, no cyanosis or edema and Homans sign is negative, no sign of DVT Wound: stable sternum  Diagnostic Studies & Laboratory data:     Recent Radiology Findings:  Ct Angio Chest Aorta W/cm &/or Wo/cm  10/28/2013   CLINICAL DATA:  Ascending thoracic aortic  aneurysm, followup, personal history of cardiac valve replacement, cardiac surgery, quit smoking 30 years ago  EXAM: CT ANGIOGRAPHY CHEST WITH CONTRAST  TECHNIQUE: Multidetector CT imaging of the chest was performed using the standard protocol during bolus administration of intravenous contrast. Multiplanar CT image reconstructions and MIPs were obtained to evaluate the vascular anatomy.  CONTRAST:  80mL OMNIPAQUE IOHEXOL 350 MG/ML SOLN  COMPARISON:  10/08/2012, 09/12/2011  FINDINGS: Replacement aortic valve noted. Aortic root is dilated to 4.4 cm transverse diameter as it was previously. Mid ascending aorta is distended to 4.8 cm, unchanged. Proximal aortic arch is 3.8 cm, not significantly different  from prior study. Distal aortic arch is 2.8 cm, stable. Proximal descending aorta is 2.6 cm, stable. Distal descending aorta is 2.1 cm, stable. No evidence of dissection or stenosis. The origins of the great vessels all appear normal. No significant calcified atherosclerotic plaque identified.  There is calcification of the left anterior descending artery. There is no significant pleural or pericardial effusion. Patient is status post previous median sternotomy. No significant hilar or mediastinal adenopathy.  4 mm pulmonary nodule image number 20 in the medial lingula is stable from 09/12/2011 and therefore considered benign. Stable cyst upper pole left kidney. No acute musculoskeletal findings.  Review of the MIP images confirms the above findings.  IMPRESSION: No change in ascending aortic aneurysm to a maximal diameter of 4.8 cm.   Electronically Signed   By: Esperanza Heir M.D.   On: 10/28/2013 09:30   Ct Angio Chest Aorta W/cm &/or Wo/cm  10/08/2012   CLINICAL DATA:  Thoracic aortic aneurysm, aortic stenosis, post mechanical prosthesis placement.  EXAM: CT ANGIOGRAPHY OF CHEST  TECHNIQUE: Multidetector CT imaging of the chest was performed during bolus injection of intravenous contrast. Multiplanar CT angiographic image reconstructions including MIPs were also generated to evaluate the vascular anatomy.  CONTRAST:  OMNIPAQUE IOHEXOL 350 MG/ML SOLN  COMPARISON:  09/12/2011  FINDINGS: Stable appearance of prosthetic aortic valve. Postop change of the aortic root which is ectatic measuring 4.4 cm maximum transverse diameter. Proximal ascending 4.8 cm, distal ascending 4.7 cm, proximal arch 3.7 cm, distal arch 2.8 cm, proximal descending 2.6 cm, and distal descending 2.1 cm just above the diaphragm. Negative for dissection or stenosis. No significant atheromatous irregularity. 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 There is fairly good contrast opacification of  the pulmonary arterial tree; exam was not optimized for detection of pulmonary emboli.  Previous median sternotomy. No pleural or pericardial effusion. Patchy coronary calcifications. No hilar or mediastinal adenopathy. Lungs are clear. Stable cyst In the upper pole left kidney. Remainder visualized upper abdomen unremarkable. thoracic spine intact.  IMPRESSION: IMPRESSION  1. Stable 4.8 cm ascending aortic aneurysm without dissection or other complicating features   Electronically Signed   By: Oley Balm M.D.   On: 10/08/2012 14:37    Recent Lab Findings: Lab Results  Component Value Date   WBC 6.4 12/20/2011   HGB 16.1 12/20/2011   HCT 47.5 12/20/2011   PLT 217.0 12/20/2011   GLUCOSE 90 10/25/2013   CHOL 175 10/25/2013   TRIG 126.0 10/25/2013   HDL 39.30 10/25/2013   LDLCALC 111* 10/25/2013   ALT 22 10/25/2013   AST 23 10/25/2013   NA 141 10/25/2013   K 3.6 10/25/2013   CL 105 10/25/2013   CREATININE 1.0 10/25/2013   BUN 13 10/25/2013   CO2 27 10/25/2013   INR 3.0 10/25/2013   Aortic Size Index=  4.8     /Body surface area is 1.91 meters squared. = 2.5  < 2.75 cm/m2      4% risk per year 2.75 to 4.25          8% risk per year > 4.25 cm/m2    20% risk per year  cross sectional area of aorta cm2/height in meters  18.09/1.778=10  Assessment / Plan:    No change in 4.8 cm ascending aortic dilatation in patient with previous mechanic aortic valve replacement. There's been no appreciable change in the size of the aorta since his scan in 2011. I discussed the diagnosis with the patient and we have decided to continue to monitor the size of his aorta and will obtain a followup CT scan in one year.   Delight Ovens MD  Beeper [phone] Office [phone] 10/28/2013 10:44 AM

## 2013-11-01 ENCOUNTER — Ambulatory Visit (INDEPENDENT_AMBULATORY_CARE_PROVIDER_SITE_OTHER): Payer: 59 | Admitting: Cardiology

## 2013-11-01 VITALS — BP 122/76 | HR 85 | Ht 70.0 in | Wt 161.0 lb

## 2013-11-01 DIAGNOSIS — Z954 Presence of other heart-valve replacement: Secondary | ICD-10-CM

## 2013-11-01 DIAGNOSIS — E7800 Pure hypercholesterolemia, unspecified: Secondary | ICD-10-CM

## 2013-11-01 DIAGNOSIS — Z952 Presence of prosthetic heart valve: Secondary | ICD-10-CM

## 2013-11-01 DIAGNOSIS — K219 Gastro-esophageal reflux disease without esophagitis: Secondary | ICD-10-CM

## 2013-11-01 NOTE — Assessment & Plan Note (Signed)
His symptoms of GERD and heartburn had increased slightly recently and he has been taking more Protonix

## 2013-11-01 NOTE — Assessment & Plan Note (Signed)
We reviewed his labs which are satisfactory and he is not having any myalgias from the atorvastatin 10 mg daily

## 2013-11-01 NOTE — Assessment & Plan Note (Signed)
The patient has not had any problem from his chronic Coumadin anticoagulation.  He is not having any symptoms of congestive heart failure or chest pain or shortness of breath.

## 2013-11-01 NOTE — Patient Instructions (Addendum)
Your physician recommends that you continue on your current medications as directed. Please refer to the Current Medication list given to you today.  Your physician wants you to follow-up in: 6 MONTH OV  You will receive a reminder letter in the mail two months in advance. If you don't receive a letter, please call our office to schedule the follow-up appointment.  

## 2013-11-01 NOTE — Progress Notes (Signed)
Ryan Lamb Date of Birth:  May 24, 1955 [facility] [address] [place]  [id number] [phone]  Fax   [phone]  HPI: This pleasant 58 year old gentleman is seen for a scheduled followup office visit. He has a history of previous severe aortic stenosis. He had a St. Jude aortic valve prosthesis placed in 1994. The patient has a known moderately dilated ascending aorta and this is being followed by Dr. Tyrone SageGerhardt. Noted on echo in November 2011. the patient has been on long-term Coumadin for his mechanical aortic valve prosthesis. He remains in normal sinus rhythm. Since last visit the patient has had no new cardiac symptoms. He has a remote history of GI bleeding but no recurrence. He is not having any symptoms from his descending aortic aneurysm.  He is having occasional symptoms of heartburn.  These tend to be worse in the fall for him.   Current Outpatient Prescriptions  Medication Sig Dispense Refill  . aspirin 325 MG tablet Take 325 mg by mouth as needed.       Marland Kitchen. atorvastatin (LIPITOR) 10 MG tablet TAKE 1 TABLET DAILY (PATIENT NEEDS APPOINTMENT)  90 tablet  0  . MULTIPLE VITAMIN PO Take by mouth.        . pantoprazole (PROTONIX) 20 MG tablet Take 1 tablet (20 mg total) by mouth as needed.  30 tablet  6  . warfarin (COUMADIN) 5 MG tablet Take as directed by Coumadin clinic  90 tablet  1   No current facility-administered medications for this visit.    Allergies  Allergen Reactions  . Penicillins     Patient Active Problem List   Diagnosis Date Noted  . GERD (gastroesophageal reflux disease) 04/22/2011  . Ascending aortic aneurysm 10/18/2010  . Heart valve replaced by other means 05/08/2010  . Encounter for long-term (current) use of anticoagulants 05/08/2010  . Aortic valve disorders 04/10/2010  . Hx of mechanical aortic valve replacement   . Hypercholesterolemia     History  Smoking status  . Former Smoker -- 0.50 packs/day  for 5 years  . Quit date: 10/17/1977  Smokeless tobacco  . Never Used    History  Alcohol Use No    No family history on file.  Review of Systems: The patient denies any heat or cold intolerance.  No weight gain or weight loss.  The patient denies headaches or blurry vision.  There is no cough or sputum production.  The patient denies dizziness.  There is no hematuria or hematochezia.  The patient denies any muscle aches or arthritis.  The patient denies any rash.  The patient denies frequent falling or instability.  There is no history of depression or anxiety.  All other systems were reviewed and are negative.   Physical Exam: Filed Vitals:   11/01/13 1541  BP: 122/76  Pulse: 85  The patient appears to be in no distress.  Head and neck exam reveals that the pupils are equal and reactive.  The extraocular movements are full.  There is no scleral icterus.  Mouth and pharynx are benign.  No lymphadenopathy.  No carotid bruits.  The jugular venous pressure is normal.  Thyroid is not enlarged or tender.  Chest is clear to percussion and auscultation.  No rales or rhonchi.  Expansion of the chest is symmetrical.  Heart reveals no abnormal lift or heave.  First and second heart sounds are normal.  There are good metallic opening and closing aortic valve clicks.  No aortic insufficiency.  Soft systolic flow murmur across aortic valve prosthesis.  The abdomen is soft and nontender.  Bowel sounds are normoactive.  There is no hepatosplenomegaly or mass.  There are no abdominal bruits.  Extremities reveal no phlebitis or edema.  Pedal pulses are good.  There is no cyanosis or clubbing.  Neurologic exam is normal strength and no lateralizing weakness.  No sensory deficits.  Integument reveals no rash   EKG shows normal sinus rhythm with occasional PVC otherwise normal  Assessment / Plan: 1.  Status post St. Jude aortic valve mechanical prosthetic valve placed in 1994. 2. moderately  dilated ascending aorta followed by Dr. Tyrone SageGerhardt 3. remote history of GI bleeding 4. hypercholesterolemia  Continue same medication.  Recheck in 6 months for office visit

## 2013-12-14 ENCOUNTER — Other Ambulatory Visit: Payer: Self-pay | Admitting: Cardiology

## 2013-12-27 ENCOUNTER — Other Ambulatory Visit: Payer: Self-pay | Admitting: Cardiology

## 2014-02-04 ENCOUNTER — Other Ambulatory Visit: Payer: Self-pay | Admitting: Cardiology

## 2014-02-11 ENCOUNTER — Ambulatory Visit (INDEPENDENT_AMBULATORY_CARE_PROVIDER_SITE_OTHER): Payer: Managed Care, Other (non HMO)

## 2014-02-11 DIAGNOSIS — I359 Nonrheumatic aortic valve disorder, unspecified: Secondary | ICD-10-CM

## 2014-02-11 DIAGNOSIS — Z952 Presence of prosthetic heart valve: Secondary | ICD-10-CM

## 2014-02-11 DIAGNOSIS — Z7901 Long term (current) use of anticoagulants: Secondary | ICD-10-CM

## 2014-02-11 DIAGNOSIS — Z954 Presence of other heart-valve replacement: Secondary | ICD-10-CM

## 2014-02-11 LAB — POCT INR: INR: 3.1

## 2014-02-24 ENCOUNTER — Other Ambulatory Visit: Payer: Self-pay | Admitting: Cardiology

## 2014-03-25 ENCOUNTER — Ambulatory Visit (INDEPENDENT_AMBULATORY_CARE_PROVIDER_SITE_OTHER): Payer: Managed Care, Other (non HMO) | Admitting: *Deleted

## 2014-03-25 DIAGNOSIS — I359 Nonrheumatic aortic valve disorder, unspecified: Secondary | ICD-10-CM

## 2014-03-25 DIAGNOSIS — Z952 Presence of prosthetic heart valve: Secondary | ICD-10-CM

## 2014-03-25 DIAGNOSIS — Z954 Presence of other heart-valve replacement: Secondary | ICD-10-CM

## 2014-03-25 DIAGNOSIS — Z7901 Long term (current) use of anticoagulants: Secondary | ICD-10-CM

## 2014-03-25 LAB — POCT INR: INR: 2.5

## 2014-05-06 ENCOUNTER — Ambulatory Visit (INDEPENDENT_AMBULATORY_CARE_PROVIDER_SITE_OTHER): Payer: Managed Care, Other (non HMO) | Admitting: *Deleted

## 2014-05-06 DIAGNOSIS — Z954 Presence of other heart-valve replacement: Secondary | ICD-10-CM

## 2014-05-06 DIAGNOSIS — Z952 Presence of prosthetic heart valve: Secondary | ICD-10-CM

## 2014-05-06 DIAGNOSIS — Z7901 Long term (current) use of anticoagulants: Secondary | ICD-10-CM

## 2014-05-06 DIAGNOSIS — I359 Nonrheumatic aortic valve disorder, unspecified: Secondary | ICD-10-CM | POA: Diagnosis not present

## 2014-05-06 LAB — POCT INR: INR: 1.8

## 2014-05-27 ENCOUNTER — Other Ambulatory Visit: Payer: Self-pay | Admitting: Cardiology

## 2014-10-06 ENCOUNTER — Telehealth: Payer: Self-pay | Admitting: Cardiology

## 2014-10-06 NOTE — Telephone Encounter (Signed)
Will discuss PCP with  Dr. Patty Sermons tomorrow

## 2014-10-06 NOTE — Telephone Encounter (Signed)
New Message  Pt calling to speak w/ Juliette Alcide- per pt- due to changing ins, pt can no longer see Dr Patty Sermons and wanted recommendations on other MDs Please call back and discuss.

## 2014-10-07 NOTE — Telephone Encounter (Signed)
Spoke with  Dr. Patty Sermons and recommended [facility] at [facility] patient

## 2014-10-19 ENCOUNTER — Ambulatory Visit: Payer: Managed Care, Other (non HMO) | Admitting: Internal Medicine

## 2014-11-21 ENCOUNTER — Encounter: Payer: Self-pay | Admitting: Internal Medicine

## 2014-11-21 ENCOUNTER — Ambulatory Visit (INDEPENDENT_AMBULATORY_CARE_PROVIDER_SITE_OTHER): Payer: 59 | Admitting: Internal Medicine

## 2014-11-21 ENCOUNTER — Other Ambulatory Visit (INDEPENDENT_AMBULATORY_CARE_PROVIDER_SITE_OTHER): Payer: 59

## 2014-11-21 VITALS — BP 118/80 | HR 83 | Temp 98.5°F | Resp 16 | Ht 70.0 in | Wt 164.8 lb

## 2014-11-21 DIAGNOSIS — Z0000 Encounter for general adult medical examination without abnormal findings: Secondary | ICD-10-CM

## 2014-11-21 DIAGNOSIS — Z23 Encounter for immunization: Secondary | ICD-10-CM

## 2014-11-21 DIAGNOSIS — K219 Gastro-esophageal reflux disease without esophagitis: Secondary | ICD-10-CM

## 2014-11-21 DIAGNOSIS — Z954 Presence of other heart-valve replacement: Secondary | ICD-10-CM

## 2014-11-21 DIAGNOSIS — Z952 Presence of prosthetic heart valve: Secondary | ICD-10-CM

## 2014-11-21 DIAGNOSIS — E7800 Pure hypercholesterolemia, unspecified: Secondary | ICD-10-CM

## 2014-11-21 LAB — LIPID PANEL
CHOLESTEROL: 174 mg/dL (ref 0–200)
HDL: 47.3 mg/dL (ref >39.00)
LDL Cholesterol: 100 mg/dL — ABNORMAL HIGH (ref 0–99)
NONHDL: 126.95
TRIGLYCERIDES: 137 mg/dL (ref 0.0–149.0)
Total CHOL/HDL Ratio: 4
VLDL: 27.4 mg/dL (ref 0.0–40.0)

## 2014-11-21 LAB — COMPREHENSIVE METABOLIC PANEL
ALK PHOS: 54 U/L (ref 39–117)
ALT: 19 U/L (ref 0–53)
AST: 19 U/L (ref 0–37)
Albumin: 4.5 g/dL (ref 3.5–5.2)
BUN: 11 mg/dL (ref 6–23)
CALCIUM: 9.4 mg/dL (ref 8.4–10.5)
CO2: 29 mEq/L (ref 19–32)
Chloride: 106 mEq/L (ref 96–112)
Creatinine, Ser: 1.03 mg/dL (ref 0.40–1.50)
GFR: 78.8 mL/min (ref >60.00)
GLUCOSE: 84 mg/dL (ref 70–99)
POTASSIUM: 4.2 meq/L (ref 3.5–5.1)
Sodium: 142 mEq/L (ref 135–145)
TOTAL PROTEIN: 6.9 g/dL (ref 6.0–8.3)
Total Bilirubin: 0.7 mg/dL (ref 0.2–1.2)

## 2014-11-21 LAB — CBC
HEMATOCRIT: 46.9 % (ref 39.0–52.0)
HEMOGLOBIN: 15.8 g/dL (ref 13.0–17.0)
MCHC: 33.7 g/dL (ref 30.0–36.0)
MCV: 88.7 fl (ref 78.0–100.0)
Platelets: 229 10*3/uL (ref 150.0–400.0)
RBC: 5.29 Mil/uL (ref 4.22–5.81)
RDW: 12.6 % (ref 11.5–15.5)
WBC: 6.9 10*3/uL (ref 4.0–10.5)

## 2014-11-21 NOTE — Patient Instructions (Signed)
We will check the blood work today and send you back to Dr. Patty SermonsBrackbill and Dr. Tyrone SageGerhardt.   Come back in about 1 year for a check up and please feel free to call us sooner with any problems or questions.   Health Maintenance, Male A healthy lifestyle and preventative care can promote health and wellness.  Maintain regular health, dental, and eye exams.  Eat a healthy diet. Foods like vegetables, fruits, whole grains, low-fat dairy products, and lean protein foods contain the nutrients you need and are low in calories. Decrease your intake of foods high in solid fats, added sugars, and salt. Get information about a proper diet from your health care provider, if necessary.  Regular physical exercise is one of the most important things you can do for your health. Most adults should get at least 150 minutes of moderate-intensity exercise (any activity that increases your heart rate and causes you to sweat) each week. In addition, most adults need muscle-strengthening exercises on 2 or more days a week.   Maintain a healthy weight. The body mass index (BMI) is a screening tool to identify possible weight problems. It provides an estimate of body fat based on height and weight. Your health care provider can find your BMI and can help you achieve or maintain a healthy weight. For males 20 years and older:  A BMI below 18.5 is considered underweight.  A BMI of 18.5 to 24.9 is normal.  A BMI of 25 to 29.9 is considered overweight.  A BMI of 30 and above is considered obese.  Maintain normal blood lipids and cholesterol by exercising and minimizing your intake of saturated fat. Eat a balanced diet with plenty of fruits and vegetables. Blood tests for lipids and cholesterol should begin at age 58 and be repeated every 5 years. If your lipid or cholesterol levels are high, you are over age 58, or you are at high risk for heart disease, you may need your cholesterol levels checked more frequently.Ongoing  high lipid and cholesterol levels should be treated with medicines if diet and exercise are not working.  If you smoke, find out from your health care provider how to quit. If you do not use tobacco, do not start.  Lung cancer screening is recommended for adults aged 55-80 years who are at high risk for developing lung cancer because of a history of smoking. A yearly low-dose CT scan of the lungs is recommended for people who have at least a 30-pack-year history of smoking and are current smokers or have quit within the past 15 years. A pack year of smoking is smoking an average of 1 pack of cigarettes a day for 1 year (for example, a 30-pack-year history of smoking could mean smoking 1 pack a day for 30 years or 2 packs a day for 15 years). Yearly screening should continue until the smoker has stopped smoking for at least 15 years. Yearly screening should be stopped for people who develop a health problem that would prevent them from having lung cancer treatment.  If you choose to drink alcohol, do not have more than 2 drinks per day. One drink is considered to be 12 oz (360 mL) of beer, 5 oz (150 mL) of wine, or 1.5 oz (45 mL) of liquor.  Avoid the use of street drugs. Do not share needles with anyone. Ask for help if you need support or instructions about stopping the use of drugs.  High blood pressure causes heart disease and  increases the risk of stroke. High blood pressure is more likely to develop in:  People who have blood pressure in the end of the normal range (100-139/85-89 mm Hg).  People who are overweight or obese.  People who are African American.  If you are 60-62 years of age, have your blood pressure checked every 3-5 years. If you are 51 years of age or older, have your blood pressure checked every year. You should have your blood pressure measured twice--once when you are at a hospital or clinic, and once when you are not at a hospital or clinic. Record the average of the two  measurements. To check your blood pressure when you are not at a hospital or clinic, you can use:  An automated blood pressure machine at a pharmacy.  A home blood pressure monitor.  If you are 1-33 years old, ask your health care provider if you should take aspirin to prevent heart disease.  Diabetes screening involves taking a blood sample to check your fasting blood sugar level. This should be done once every 3 years after age 61 if you are at a normal weight and without risk factors for diabetes. Testing should be considered at a younger age or be carried out more frequently if you are overweight and have at least 1 risk factor for diabetes.  Colorectal cancer can be detected and often prevented. Most routine colorectal cancer screening begins at the age of 14 and continues through age 10. However, your health care provider may recommend screening at an earlier age if you have risk factors for colon cancer. On a yearly basis, your health care provider may provide home test kits to check for hidden blood in the stool. A small camera at the end of a tube may be used to directly examine the colon (sigmoidoscopy or colonoscopy) to detect the earliest forms of colorectal cancer. Talk to your health care provider about this at age 87 when routine screening begins. A direct exam of the colon should be repeated every 5-10 years through age 60, unless early forms of precancerous polyps or small growths are found.  People who are at an increased risk for hepatitis B should be screened for this virus. You are considered at high risk for hepatitis B if:  You were born in a country where hepatitis B occurs often. Talk with your health care provider about which countries are considered high risk.  Your parents were born in a high-risk country and you have not received a shot to protect against hepatitis B (hepatitis B vaccine).  You have HIV or AIDS.  You use needles to inject street drugs.  You live  with, or have sex with, someone who has hepatitis B.  You are a man who has sex with other men (MSM).  You get hemodialysis treatment.  You take certain medicines for conditions like cancer, organ transplantation, and autoimmune conditions.  Hepatitis C blood testing is recommended for all people born from [birth year] through 1964 and any individual with known risk factors for hepatitis C.  Healthy men should no longer receive prostate-specific antigen (PSA) blood tests as part of routine cancer screening. Talk to your health care provider about prostate cancer screening.  Testicular cancer screening is not recommended for adolescents or adult males who have no symptoms. Screening includes self-exam, a health care provider exam, and other screening tests. Consult with your health care provider about any symptoms you have or any concerns you have about testicular cancer.  Practice safe sex. Use condoms and avoid high-risk sexual practices to reduce the spread of sexually transmitted infections (STIs).  You should be screened for STIs, including gonorrhea and chlamydia if:  You are sexually active and are younger than 24 years.  You are older than 24 years, and your health care provider tells you that you are at risk for this type of infection.  Your sexual activity has changed since you were last screened, and you are at an increased risk for chlamydia or gonorrhea. Ask your health care provider if you are at risk.  If you are at risk of being infected with HIV, it is recommended that you take a prescription medicine daily to prevent HIV infection. This is called pre-exposure prophylaxis (PrEP). You are considered at risk if:  You are a man who has sex with other men (MSM).  You are a heterosexual man who is sexually active with multiple partners.  You take drugs by injection.  You are sexually active with a partner who has HIV.  Talk with your health care provider about whether you are at  high risk of being infected with HIV. If you choose to begin PrEP, you should first be tested for HIV. You should then be tested every 3 months for as long as you are taking PrEP.  Use sunscreen. Apply sunscreen liberally and repeatedly throughout the day. You should seek shade when your shadow is shorter than you. Protect yourself by wearing long sleeves, pants, a wide-brimmed hat, and sunglasses year round whenever you are outdoors.  Tell your health care provider of new moles or changes in moles, especially if there is a change in shape or color. Also, tell your health care provider if a mole is larger than the size of a pencil eraser.  A one-time screening for abdominal aortic aneurysm (AAA) and surgical repair of large AAAs by ultrasound is recommended for men aged 8-75 years who are current or former smokers.  Stay current with your vaccines (immunizations).   This information is not intended to replace advice given to you by your health care provider. Make sure you discuss any questions you have with your health care provider.   Document Released: 06/22/2007 Document Revised: 01/14/2014 Document Reviewed: 05/21/2010 Elsevier Interactive Patient Education [employer].

## 2014-11-21 NOTE — Progress Notes (Signed)
Pre visit review using our clinic review tool, if applicable. No additional management support is needed unless otherwise documented below in the visit note. 

## 2014-11-21 NOTE — Progress Notes (Signed)
   Subjective:    Patient ID: Ryan Lamb, male    DOB: 06-07-1955, 59 y.o.   MRN: [id number]  HPI The patient is a new 59 YO man coming in for continuation of his care. Previously he has only had a cardiologist who has been managing his medical problems. He does have GERD which is well controlled. He is active and exercises. Takes coumadin for her artificial heart valve. Declines new problems today and no side effects from his medicines. Non-smoker.   PMH, [facility], social history reviewed and updated.   Review of Systems  Constitutional: Negative for fever, activity change, appetite change and fatigue.  HENT: Negative.   Eyes: Negative.   Respiratory: Negative for cough, chest tightness, shortness of breath and wheezing.   Cardiovascular: Negative for chest pain, palpitations and leg swelling.  Gastrointestinal: Negative for nausea, abdominal pain, diarrhea, constipation and abdominal distention.  Musculoskeletal: Negative.   Skin: Negative.   Neurological: Negative.   Psychiatric/Behavioral: Negative.       Objective:   Physical Exam  Constitutional: He is oriented to person, place, and time. He appears well-developed and well-nourished.  HENT:  Head: Normocephalic and atraumatic.  Eyes: EOM are normal.  Neck: Normal range of motion.  Cardiovascular: Normal rate and regular rhythm.   Click noted  Pulmonary/Chest: Effort normal and breath sounds normal. No respiratory distress. He has no wheezes. He has no rales.  Abdominal: Soft. Bowel sounds are normal. He exhibits no distension. There is no tenderness. There is no rebound.  Musculoskeletal: He exhibits no edema.  Neurological: He is alert and oriented to person, place, and time. Coordination normal.  Skin: Skin is warm and dry.  Psychiatric: He has a normal mood and affect.   Filed Vitals:   11/21/14 1409  BP: 118/80  Pulse: 83  Temp: 98.5 F (36.9 C)  TempSrc: Oral  Resp: 16  Height: 5\' 10"  (1.778 m)  Weight:  164 lb 12.8 oz (74.753 kg)  SpO2: 98%      Assessment & Plan:  Tdap and flu shot given at visit

## 2014-11-22 ENCOUNTER — Encounter: Payer: Self-pay | Admitting: Internal Medicine

## 2014-11-22 ENCOUNTER — Other Ambulatory Visit: Payer: Self-pay | Admitting: *Deleted

## 2014-11-22 DIAGNOSIS — I712 Thoracic aortic aneurysm, without rupture: Secondary | ICD-10-CM

## 2014-11-22 DIAGNOSIS — I7121 Aneurysm of the ascending aorta, without rupture: Secondary | ICD-10-CM

## 2014-11-22 LAB — HEPATITIS C ANTIBODY: HCV Ab: NEGATIVE

## 2014-11-22 NOTE — Assessment & Plan Note (Signed)
Checking lipid panel and adjust as needed. Currently taking lipitor 10 mg daily without side effects.

## 2014-11-22 NOTE — Assessment & Plan Note (Signed)
He is doing well on protonix and no symptoms.

## 2014-11-22 NOTE — Assessment & Plan Note (Addendum)
He does take coumadin and aspirin and follows with cardiology for monitoring. Needs referral to cardiology and CT surgery so placed at visit.

## 2014-11-25 ENCOUNTER — Telehealth: Payer: Self-pay | Admitting: Internal Medicine

## 2014-11-25 NOTE — Telephone Encounter (Signed)
Can you schedule pt to come for a visit with Arline AspCindy?

## 2014-11-25 NOTE — Telephone Encounter (Signed)
Pt called in and said that he get his INR checked and wanted to know if he could start coming here?

## 2014-12-22 ENCOUNTER — Ambulatory Visit (INDEPENDENT_AMBULATORY_CARE_PROVIDER_SITE_OTHER): Payer: 59 | Admitting: Pharmacist

## 2014-12-22 DIAGNOSIS — Z7901 Long term (current) use of anticoagulants: Secondary | ICD-10-CM

## 2014-12-22 DIAGNOSIS — I359 Nonrheumatic aortic valve disorder, unspecified: Secondary | ICD-10-CM

## 2014-12-22 DIAGNOSIS — Z954 Presence of other heart-valve replacement: Secondary | ICD-10-CM | POA: Diagnosis not present

## 2014-12-22 DIAGNOSIS — Z952 Presence of prosthetic heart valve: Secondary | ICD-10-CM

## 2014-12-22 LAB — POCT INR: INR: 3.4

## 2014-12-29 ENCOUNTER — Ambulatory Visit (INDEPENDENT_AMBULATORY_CARE_PROVIDER_SITE_OTHER): Payer: 59 | Admitting: Cardiothoracic Surgery

## 2014-12-29 ENCOUNTER — Encounter: Payer: Self-pay | Admitting: Cardiothoracic Surgery

## 2014-12-29 ENCOUNTER — Ambulatory Visit
Admission: RE | Admit: 2014-12-29 | Discharge: 2014-12-29 | Disposition: A | Discharge status: Home / Self Care | Payer: 59 | Source: Ambulatory Visit | Attending: Cardiothoracic Surgery | Admitting: Cardiothoracic Surgery

## 2014-12-29 VITALS — BP 124/74 | HR 76 | Resp 20 | Ht 70.0 in | Wt 165.0 lb

## 2014-12-29 DIAGNOSIS — I712 Thoracic aortic aneurysm, without rupture: Secondary | ICD-10-CM | POA: Diagnosis not present

## 2014-12-29 DIAGNOSIS — I7121 Aneurysm of the ascending aorta, without rupture: Secondary | ICD-10-CM

## 2014-12-29 MED ORDER — IOPAMIDOL (ISOVUE-370) INJECTION 76%
75.0000 mL | Freq: Once | INTRAVENOUS | Status: AC | PRN
  Administered 2014-12-29: 75 mL via INTRAVENOUS

## 2014-12-29 NOTE — Progress Notes (Signed)
[address]       [place] [id number]             [phone]         Ryan AltesHarold E Mcconnell [name] [facility] Record #[id number] Date of Birth: 07-03-1955  Referring: Ryan Lamb Primary Care: Ryan Lamb  Chief Complaint:    Chief Complaint  Patient presents with  . Thoracic Aortic Aneurysm    1 year f/u with CTA Chest     History of Present Illness:    Patient returns today with a followup CTA of the chest. In 1971 the patient had aortic valvuloplasty. Subsequently in 1994 I replaced his aortic valve with a #23 [employer] mechanical prosthesis. At the time of surgery   4/20 1994 He was noted to have a tri leaflet aortic valve.  In December of 2011 because of a chest x-ray suggestive of dilatation of his descending aorta he was seen again and has had CT scans of the chest in 2011, 2012,2013, 2014 , 2015  and today. He has no family history of connective tissue or Marfan's disorder. He remains asymptomatic. He notes he follows his blood pressure carefully and has not been hypertensive. He continues to follow endocarditis precautions especially with dental work.    Current Activity/ Functional Status: Patient remains active without any physical limitations    Past Medical History  Diagnosis Date  . Aortic stenosis as infant, with recurrent as and valve replacement   . Aortic valve prosthesis present   . Hypercholesterolemia     Past Surgical History  Procedure Date  . Cardiac valve replacement- mechanical aortic valve  1994     There is no family history of Marfan's or other connective tissue disorder or sudden death from dissection .  History   Social History  . Marital Status: Single    Spouse Name: N/A    Number of Children: N/A  . Years of Education: N/A   Occupational History  . Not on file.   Social History Main Topics  . Smoking status: Former Smoker -- 0.5 packs/day for 5 years    Quit date:  10/17/1977  . Smokeless tobacco: Never Used  . Alcohol Use: No  . Drug Use: No              Social History Narrative  . No narrative on file    History  Smoking status  . Former Smoker -- 0.50 packs/day for 5 years  . Quit date: 10/17/1977  Smokeless tobacco  . Never Used    History  Alcohol Use No     Allergies  Allergen Reactions  . Penicillins     Current Outpatient Prescriptions  Medication Sig Dispense Refill  . aspirin 325 MG tablet Take 325 mg by mouth as needed.     Marland Kitchen. atorvastatin (LIPITOR) 10 MG tablet TAKE 1 TABLET DAILY 90 tablet 0  . MULTIPLE VITAMIN PO Take by mouth.      . pantoprazole (PROTONIX) 20 MG tablet TAKE 1 TABLET BY MOUTH DAILY AS NEEDED 30 tablet 6  . warfarin (COUMADIN) 5 MG tablet Take as directed by Coumadin clinic 90 tablet 1   No current facility-administered medications for this visit.       Review of Systems:     Cardiac Review of Systems: Y or N  Chest Pain [  n  ]  Resting SOB [n   ] Exertional SOB  [ n ]  Orthopnea [  n]   Pedal Edema [   n]    Palpitations [ n ] Syncope  [ n ]   Presyncope [ n  ]  General Review of Systems: [Y] = yes [  ]=no Constitional: recent weight change [n  ]; anorexia [  ]; fatigue [  ]; nausea [  ]; night sweats [  ]; fever [  ]; or chills [  ];                                                                                                                                          Dental: poor dentition[n  ]; Last Dentist visit: yearly  Eye : blurred vision [  ]; diplopia [   ]; vision changes [  ];  Amaurosis fugax[  ]; Resp: cough [  ];  wheezing[  ];  hemoptysis[  ]; shortness of breath[  ]; paroxysmal nocturnal dyspnea[  ]; dyspnea on exertion[  ]; or orthopnea[  ];  GI:  gallstones[  ], vomiting[  ];  dysphagia[  ]; melena[ in past pylop removed ];  hematochezia [  ]; heartburn[  ];   Hx of  Colonoscopy[ y ]; GU: kidney stones [  ]; hematuria[  ];   dysuria [  ];  nocturia[  ];  history of      obstruction [  ];             Skin: rash, swelling[  ];, hair loss[  ];  peripheral edema[  ];  or itching[  ]; Musculosketetal: myalgias[  ];  joint swelling[  ];  joint erythema[  ];  joint pain[  ];  back pain[  ];  Heme/Lymph: bruising[on coumadin  ];  bleeding[  ];  anemia[  ];  Neuro: TIA[ n ];  headaches[  ];  stroke[  ];  vertigo[  ];  seizures[  ];   paresthesias[  ];  difficulty walking[  ];  Psych:depression[  ]; anxiety[  ];  Endocrine: diabetes[  ];  thyroid dysfunction[  ];  Immunizations: Flu [ y ]; Pneumococcal[n  ];  Other:  Physical Exam: BP 124/74 mmHg  Pulse 76  Resp 20  Ht 5\' 10"  (1.778 m)  Wt 165 lb (74.844 kg)  BMI 23.68 kg/m2  SpO2 98%  General appearance: alert, cooperative and appears stated age Neurologic: intact Heart: normal apical impulse and crisp mechanical valve sound no murmur of aortic insufficiency Lungs: clear to auscultation bilaterally and normal percussion bilaterally Abdomen: soft, non-tender; bowel sounds normal; no masses,  no organomegaly Extremities: extremities normal, atraumatic, no cyanosis or edema and Homans sign is negative, no sign of DVT Wound: stable sternum  Diagnostic Studies & Laboratory data:     Recent Radiology Findings:  Ct Angio Chest  Aorta W/cm &/or Wo/cm  12/29/2014  CLINICAL DATA:  Follow-up aneurysm EXAM: CT ANGIOGRAPHY CHEST WITH CONTRAST TECHNIQUE: Multidetector CT imaging of the chest was performed using the standard protocol during bolus administration of intravenous contrast. Multiplanar CT image reconstructions and MIPs were obtained to evaluate the vascular anatomy. CONTRAST:  75 cc Isovue 370 IV COMPARISON:  10/28/2013 FINDINGS: Ascending thoracic aortic aneurysm again noted measuring 4.9 cm in greatest diameter compared with 4.8 cm previously. Proximal aortic arch 4.6 cm. Distal aortic arch 2.7 cm. Proximal descending thoracic aorta 2.5 cm. Heart is borderline in size. Prior aortic valve replacement.  Calcifications throughout the left anterior descending coronary artery. No mediastinal, hilar, or axillary adenopathy. Chest wall soft tissues are unremarkable. Imaging into the upper abdomen shows no acute findings. Stable 4 mm nodule in the medial lingula on image 29. Lungs are otherwise clear. No focal airspace opacities or suspicious nodules. No effusions. Imaging into the upper abdomen shows no acute findings. No acute bony abnormality. Review of the MIP images confirms the above findings. IMPRESSION: Stable ascending thoracic aortic aneurysm, 4.9 cm maximally compared to 4.8 cm previously. Electronically Signed   By: Charlett Nose M.D.   On: 12/29/2014 11:12   Ct Angio Chest Aorta W/cm &/or Wo/cm  10/28/2013   CLINICAL DATA:  Ascending thoracic aortic aneurysm, followup, personal history of cardiac valve replacement, cardiac surgery, quit smoking 30 years ago  EXAM: CT ANGIOGRAPHY CHEST WITH CONTRAST  TECHNIQUE: Multidetector CT imaging of the chest was performed using the standard protocol during bolus administration of intravenous contrast. Multiplanar CT image reconstructions and MIPs were obtained to evaluate the vascular anatomy.  CONTRAST:  80mL OMNIPAQUE IOHEXOL 350 MG/ML SOLN  COMPARISON:  10/08/2012, 09/12/2011  FINDINGS: Replacement aortic valve noted. Aortic root is dilated to 4.4 cm transverse diameter as it was previously. Mid ascending aorta is distended to 4.8 cm, unchanged. Proximal aortic arch is 3.8 cm, not significantly different from prior study. Distal aortic arch is 2.8 cm, stable. Proximal descending aorta is 2.6 cm, stable. Distal descending aorta is 2.1 cm, stable. No evidence of dissection or stenosis. The origins of the great vessels all appear normal. No significant calcified atherosclerotic plaque identified.  There is calcification of the left anterior descending artery. There is no significant pleural or pericardial effusion. Patient is status post previous median sternotomy.  No significant hilar or mediastinal adenopathy.  4 mm pulmonary nodule image number 20 in the medial lingula is stable from 09/12/2011 and therefore considered benign. Stable cyst upper pole left kidney. No acute musculoskeletal findings.  Review of the MIP images confirms the above findings.  IMPRESSION: No change in ascending aortic aneurysm to a maximal diameter of 4.8 cm.   Electronically Signed   By: Esperanza Heir M.D.   On: 10/28/2013 09:30   Ct Angio Chest Aorta W/cm &/or Wo/cm  10/08/2012   CLINICAL DATA:  Thoracic aortic aneurysm, aortic stenosis, post mechanical prosthesis placement.  EXAM: CT ANGIOGRAPHY OF CHEST  TECHNIQUE: Multidetector CT imaging of the chest was performed during bolus injection of intravenous contrast. Multiplanar CT angiographic image reconstructions including MIPs were also generated to evaluate the vascular anatomy.  CONTRAST:  OMNIPAQUE IOHEXOL 350 MG/ML SOLN  COMPARISON:  09/12/2011  FINDINGS: Stable appearance of prosthetic aortic valve. Postop change of the aortic root which is ectatic measuring 4.4 cm maximum transverse diameter. Proximal ascending 4.8 cm, distal ascending 4.7 cm, proximal arch 3.7 cm, distal arch 2.8 cm, proximal descending 2.6 cm, and distal  descending 2.1 cm just above the diaphragm. Negative for dissection or stenosis. No significant atheromatous irregularity. 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 There is fairly good contrast opacification of the pulmonary arterial tree; exam was not optimized for detection of pulmonary emboli.  Previous median sternotomy. No pleural or pericardial effusion. Patchy coronary calcifications. No hilar or mediastinal adenopathy. Lungs are clear. Stable cyst In the upper pole left kidney. Remainder visualized upper abdomen unremarkable. thoracic spine intact.  IMPRESSION: IMPRESSION  1. Stable 4.8 cm ascending aortic aneurysm without dissection or other complicating  features   Electronically Signed   By: Oley Balm M.D.   On: 10/08/2012 14:37    Recent Lab Findings: Lab Results  Component Value Date   WBC 6.9 11/21/2014   HGB 15.8 11/21/2014   HCT 46.9 11/21/2014   PLT 229.0 11/21/2014   GLUCOSE 84 11/21/2014   CHOL 174 11/21/2014   TRIG 137.0 11/21/2014   HDL 47.30 11/21/2014   LDLCALC 100* 11/21/2014   ALT 19 11/21/2014   AST 19 11/21/2014   NA 142 11/21/2014   K 4.2 11/21/2014   CL 106 11/21/2014   CREATININE 1.03 11/21/2014   BUN 11 11/21/2014   CO2 29 11/21/2014   INR 3.4 12/22/2014   Aortic Size Index=    4.8     /Body surface area is 1.92 meters squared. = 2.5  < 2.75 cm/m2      4% risk per year 2.75 to 4.25          8% risk per year > 4.25 cm/m2    20% risk per year    Assessment / Plan:    No change in 4.8- 4.9 since 2011 cm ascending aortic dilatation in patient with previous mechanic aortic valve replacement. There's been no appreciable change in the size of the aorta since his scan in 2011. I discussed the diagnosis with the patient and we have decided to continue to monitor the size of his aorta and will obtain a followup CT scan in one year.  I've reviewed with him the signs and symptoms of aortic dissection, instructed to avoid heavy lifting or competitive weightlifting.  Delight Ovens Lamb  Beeper [phone] Office [phone] 12/29/2014 12:00 PM

## 2015-01-08 ENCOUNTER — Other Ambulatory Visit: Payer: Self-pay | Admitting: Cardiology

## 2015-01-18 ENCOUNTER — Other Ambulatory Visit: Payer: Self-pay | Admitting: Cardiology

## 2015-01-26 ENCOUNTER — Ambulatory Visit (INDEPENDENT_AMBULATORY_CARE_PROVIDER_SITE_OTHER): Payer: BLUE CROSS/BLUE SHIELD | Admitting: Cardiology

## 2015-01-26 ENCOUNTER — Ambulatory Visit (INDEPENDENT_AMBULATORY_CARE_PROVIDER_SITE_OTHER): Payer: BLUE CROSS/BLUE SHIELD | Admitting: *Deleted

## 2015-01-26 ENCOUNTER — Encounter: Payer: Self-pay | Admitting: Cardiology

## 2015-01-26 VITALS — BP 116/74 | HR 92

## 2015-01-26 DIAGNOSIS — Z952 Presence of prosthetic heart valve: Secondary | ICD-10-CM

## 2015-01-26 DIAGNOSIS — Z954 Presence of other heart-valve replacement: Secondary | ICD-10-CM

## 2015-01-26 DIAGNOSIS — Z7901 Long term (current) use of anticoagulants: Secondary | ICD-10-CM

## 2015-01-26 DIAGNOSIS — I712 Thoracic aortic aneurysm, without rupture: Secondary | ICD-10-CM | POA: Diagnosis not present

## 2015-01-26 DIAGNOSIS — I359 Nonrheumatic aortic valve disorder, unspecified: Secondary | ICD-10-CM | POA: Diagnosis not present

## 2015-01-26 DIAGNOSIS — I7121 Aneurysm of the ascending aorta, without rupture: Secondary | ICD-10-CM

## 2015-01-26 LAB — POCT INR: INR: 2.2

## 2015-01-26 NOTE — Progress Notes (Signed)
Cardiology Office Note   Date:  01/26/2015   ID:  Ryan Lamb, DOB 01-11-1955, MRN [id number]  PCP:  Myrlene Broker, MD  Cardiologist: Cassell Clement MD  Chief Complaint  Patient presents with  . routine office visit    Patient denies chest pain, shortness of breath, le edema, or claudication      History of Present Illness: Ryan Lamb is a 60 y.o. male who presents for six-month follow-up office visit This pleasant 60 year old gentleman is seen for a scheduled followup office visit. He has a history of previous severe aortic stenosis. He had a St. Jude aortic valve prosthesis placed in 1994. The patient has a known moderately dilated ascending aorta and this is being followed by Dr. Tyrone Sage. Noted on echo in November 2011. the patient has been on long-term Coumadin for his mechanical aortic valve prosthesis. He remains in normal sinus rhythm. Since last visit the patient has had no new cardiac symptoms. He has a remote history of GI bleeding but no recurrence.the GI bleeding occurred when he was taking a lot of nonsteroidal anti-inflammatories. He is not having any symptoms from his ascending aortic aneurysm. He is having occasional symptoms of heartburn. These tend to be worse in the fall for him.he takes occasional protonix. He gets exercise by working out and playing golf.  He is not having any chest pain or shortness of breath or palpitations.  No dizziness or syncope.  Past Medical History  Diagnosis Date  . Aortic stenosis   . Aortic valve prosthesis present   . Hypercholesterolemia   . Dissecting aortic aneurysm (any part), thoracic [facility])     Past Surgical History  Procedure Laterality Date  . Cardiac valve replacement       Current Outpatient Prescriptions  Medication Sig Dispense Refill  . aspirin 325 MG tablet Take 325 mg by mouth as needed for mild pain or headache.     Marland Kitchen atorvastatin (LIPITOR) 10 MG tablet Take 10 mg by mouth  daily.    . MULTIPLE VITAMIN PO Take 1 tablet by mouth daily.     . pantoprazole (PROTONIX) 20 MG tablet Take 20 mg by mouth daily as needed for heartburn or indigestion.    Marland Kitchen warfarin (COUMADIN) 5 MG tablet Take as directed by Coumadin clinic 90 tablet 1   No current facility-administered medications for this visit.    Allergies:   Penicillins    Social History:  The patient  reports that he quit smoking about 37 years ago. He has never used smokeless tobacco. He reports that he does not drink alcohol or use illicit drugs.   Family History:  The patient's family history includes Heart disease in his father and mother.    ROS:  Please see the history of present illness.   Otherwise, review of systems are positive for none.   All other systems are reviewed and negative.    PHYSICAL EXAM: VS:  BP 116/74 mmHg  Pulse 92 , BMI There is no weight on file to calculate BMI. GEN: Well nourished, well developed, in no acute distress HEENT: normal Neck: no JVD, carotid bruits, or masses Cardiac:regular rhythm.  Grade 2/6 systolic ejection murmur across the prosthetic aortic valve.  No diastolic murmur.  Good aortic valve click are no peripheral edema.  No gallop or rub. Respiratory:  clear to auscultation bilaterally, normal work of breathing GI: soft, nontender, nondistended, + BS MS: no deformity or atrophy Skin: warm and dry,  no rash Neuro:  Strength and sensation are intact Psych: euthymic mood, full affect   EKG:  EKG is not ordered today.    Recent Labs: 11/21/2014: ALT 19; BUN 11; Creatinine, Ser 1.03; Hemoglobin 15.8; Platelets 229.0; Potassium 4.2; Sodium 142    Lipid Panel    Component Value Date/Time   CHOL 174 11/21/2014 1447   TRIG 137.0 11/21/2014 1447   HDL 47.30 11/21/2014 1447   CHOLHDL 4 11/21/2014 1447   VLDL 27.4 11/21/2014 1447   LDLCALC 100* 11/21/2014 1447      Wt Readings from Last 3 Encounters:  12/29/14 165 lb (74.844 kg)  11/21/14 164 lb 12.8 oz  (74.753 kg)  11/01/13 161 lb (73.029 kg)         ASSESSMENT AND PLAN:  1. Status post St. Jude aortic valve mechanical prosthetic valve placed in 1994. 2. moderately dilated ascending aorta followed by Dr. Tyrone Sage 3. remote history of GI bleeding 4. hypercholesterolemia   Current medicines are reviewed at length with the patient today.  The patient does not have concerns regarding medicines.  The following changes have been made:  no change  Labs/ tests ordered today include:  No orders of the defined types were placed in this encounter.    Disposition: Continue current medication.  We are checking his INR today.  Recheck in 6 months for follow-up office visit with Dr. Delton See.   Karie Schwalbe MD 01/26/2015 5:42 PM    [facility] [address] [place], [place], [place]  [address] Phone: [phone]; Fax: [phone]

## 2015-01-26 NOTE — Patient Instructions (Signed)
Medication Instructions:  Your physician recommends that you continue on your current medications as directed. Please refer to the Current Medication list given to you today.  Labwork: INR   Testing/Procedures: none  Follow-Up: Your physician wants you to follow-up in: 6 month ov with Dr Johnell Comings will receive a reminder letter in the mail two months in advance. If you don't receive a letter, please call our office to schedule the follow-up appointment.  If you need a refill on your cardiac medications before your next appointment, please call your pharmacy.

## 2015-03-22 ENCOUNTER — Telehealth: Payer: Self-pay | Admitting: Pharmacist

## 2015-03-22 ENCOUNTER — Ambulatory Visit (INDEPENDENT_AMBULATORY_CARE_PROVIDER_SITE_OTHER): Payer: BLUE CROSS/BLUE SHIELD | Admitting: Pharmacist

## 2015-03-22 DIAGNOSIS — Z954 Presence of other heart-valve replacement: Secondary | ICD-10-CM

## 2015-03-22 DIAGNOSIS — Z7901 Long term (current) use of anticoagulants: Secondary | ICD-10-CM | POA: Diagnosis not present

## 2015-03-22 DIAGNOSIS — I359 Nonrheumatic aortic valve disorder, unspecified: Secondary | ICD-10-CM

## 2015-03-22 DIAGNOSIS — Z952 Presence of prosthetic heart valve: Secondary | ICD-10-CM

## 2015-03-22 LAB — POCT INR: INR: 4.1

## 2015-03-22 NOTE — Telephone Encounter (Signed)
LMOM for pt to call and schedule Coumadin check - he's 2 months overdue for a visit.

## 2015-04-05 ENCOUNTER — Ambulatory Visit (INDEPENDENT_AMBULATORY_CARE_PROVIDER_SITE_OTHER): Payer: BLUE CROSS/BLUE SHIELD | Admitting: *Deleted

## 2015-04-05 DIAGNOSIS — Z952 Presence of prosthetic heart valve: Secondary | ICD-10-CM

## 2015-04-05 DIAGNOSIS — Z954 Presence of other heart-valve replacement: Secondary | ICD-10-CM

## 2015-04-05 DIAGNOSIS — I359 Nonrheumatic aortic valve disorder, unspecified: Secondary | ICD-10-CM

## 2015-04-05 DIAGNOSIS — Z7901 Long term (current) use of anticoagulants: Secondary | ICD-10-CM

## 2015-04-05 LAB — POCT INR: INR: 4.4

## 2015-04-19 ENCOUNTER — Ambulatory Visit (INDEPENDENT_AMBULATORY_CARE_PROVIDER_SITE_OTHER): Payer: BLUE CROSS/BLUE SHIELD | Admitting: Pharmacist

## 2015-04-19 DIAGNOSIS — Z952 Presence of prosthetic heart valve: Secondary | ICD-10-CM

## 2015-04-19 DIAGNOSIS — Z7901 Long term (current) use of anticoagulants: Secondary | ICD-10-CM

## 2015-04-19 DIAGNOSIS — I359 Nonrheumatic aortic valve disorder, unspecified: Secondary | ICD-10-CM | POA: Diagnosis not present

## 2015-04-19 DIAGNOSIS — Z954 Presence of other heart-valve replacement: Secondary | ICD-10-CM

## 2015-04-19 LAB — POCT INR: INR: 2.6

## 2015-04-27 DIAGNOSIS — J322 Chronic ethmoidal sinusitis: Secondary | ICD-10-CM | POA: Diagnosis not present

## 2015-04-27 DIAGNOSIS — J040 Acute laryngitis: Secondary | ICD-10-CM | POA: Diagnosis not present

## 2015-04-27 DIAGNOSIS — H6123 Impacted cerumen, bilateral: Secondary | ICD-10-CM | POA: Diagnosis not present

## 2015-04-27 DIAGNOSIS — J320 Chronic maxillary sinusitis: Secondary | ICD-10-CM | POA: Diagnosis not present

## 2015-05-17 ENCOUNTER — Ambulatory Visit (INDEPENDENT_AMBULATORY_CARE_PROVIDER_SITE_OTHER): Payer: BLUE CROSS/BLUE SHIELD | Admitting: *Deleted

## 2015-05-17 DIAGNOSIS — Z954 Presence of other heart-valve replacement: Secondary | ICD-10-CM

## 2015-05-17 DIAGNOSIS — Z7901 Long term (current) use of anticoagulants: Secondary | ICD-10-CM | POA: Diagnosis not present

## 2015-05-17 DIAGNOSIS — I359 Nonrheumatic aortic valve disorder, unspecified: Secondary | ICD-10-CM

## 2015-05-17 DIAGNOSIS — Z952 Presence of prosthetic heart valve: Secondary | ICD-10-CM

## 2015-05-17 LAB — POCT INR: INR: 2.7

## 2015-06-14 ENCOUNTER — Telehealth: Payer: Self-pay | Admitting: Pharmacist

## 2015-06-14 ENCOUNTER — Ambulatory Visit (INDEPENDENT_AMBULATORY_CARE_PROVIDER_SITE_OTHER): Payer: BLUE CROSS/BLUE SHIELD | Admitting: *Deleted

## 2015-06-14 DIAGNOSIS — Z954 Presence of other heart-valve replacement: Secondary | ICD-10-CM | POA: Diagnosis not present

## 2015-06-14 DIAGNOSIS — Z7901 Long term (current) use of anticoagulants: Secondary | ICD-10-CM

## 2015-06-14 DIAGNOSIS — I359 Nonrheumatic aortic valve disorder, unspecified: Secondary | ICD-10-CM | POA: Diagnosis not present

## 2015-06-14 DIAGNOSIS — Z952 Presence of prosthetic heart valve: Secondary | ICD-10-CM

## 2015-06-14 LAB — POCT INR: INR: 3.1

## 2015-06-14 NOTE — Telephone Encounter (Signed)
[facility] requesting clearance to hold Coumadin for colonoscopy on 08/16/15.  Pt on coumadin for mechanical AVR.  No history of Afib.  Per protocol, okay to hold Coumadin x 5 days prior to procedure.  Will fax clearance to Dr. Marge DuncansSchooler's office.

## 2015-06-15 ENCOUNTER — Other Ambulatory Visit: Payer: Self-pay | Admitting: *Deleted

## 2015-06-15 MED ORDER — WARFARIN SODIUM 5 MG PO TABS: ORAL_TABLET | ORAL | Status: DC

## 2015-06-15 MED ORDER — ATORVASTATIN CALCIUM 10 MG PO TABS: 10.0000 mg | ORAL_TABLET | Freq: Every day | ORAL | Status: DC

## 2015-07-26 ENCOUNTER — Encounter: Payer: Self-pay | Admitting: *Deleted

## 2015-07-26 ENCOUNTER — Ambulatory Visit (INDEPENDENT_AMBULATORY_CARE_PROVIDER_SITE_OTHER): Payer: BLUE CROSS/BLUE SHIELD | Admitting: Cardiology

## 2015-07-26 ENCOUNTER — Ambulatory Visit (INDEPENDENT_AMBULATORY_CARE_PROVIDER_SITE_OTHER): Payer: BLUE CROSS/BLUE SHIELD | Admitting: *Deleted

## 2015-07-26 VITALS — BP 118/81 | HR 70 | Ht 70.0 in | Wt 166.0 lb

## 2015-07-26 DIAGNOSIS — I7121 Aneurysm of the ascending aorta, without rupture: Secondary | ICD-10-CM

## 2015-07-26 DIAGNOSIS — Z952 Presence of prosthetic heart valve: Secondary | ICD-10-CM

## 2015-07-26 DIAGNOSIS — I359 Nonrheumatic aortic valve disorder, unspecified: Secondary | ICD-10-CM

## 2015-07-26 DIAGNOSIS — Z7901 Long term (current) use of anticoagulants: Secondary | ICD-10-CM

## 2015-07-26 DIAGNOSIS — Z954 Presence of other heart-valve replacement: Secondary | ICD-10-CM

## 2015-07-26 DIAGNOSIS — K219 Gastro-esophageal reflux disease without esophagitis: Secondary | ICD-10-CM

## 2015-07-26 DIAGNOSIS — I712 Thoracic aortic aneurysm, without rupture: Secondary | ICD-10-CM

## 2015-07-26 LAB — POCT INR: INR: 3.9

## 2015-07-26 NOTE — Patient Instructions (Signed)
Medication Instructions:  Your physician recommends that you continue on your current medications as directed. Please refer to the Current Medication list given to you today.   Labwork: None ordered  Testing/Procedures: Your physician has requested that you have an echocardiogram. Echocardiography is a painless test that uses sound waves to create images of your heart. It provides your doctor with information about the size and shape of your heart and how well your heart's chambers and valves are working. This procedure takes approximately one hour. There are no restrictions for this procedure.    Follow-Up: Your physician wants you to follow-up in: 6 months with Dr.Nelson You will receive a reminder letter in the mail two months in advance. If you don't receive a letter, please call our office to schedule the follow-up appointment.   Any Other Special Instructions Will Be Listed Below (If Applicable).     If you need a refill on your cardiac medications before your next appointment, please call your pharmacy.

## 2015-07-26 NOTE — Progress Notes (Signed)
Cardiology Office Note    Date:  07/26/2015   ID:  Ryan Lamb, DOB [birth year], MRN [id number]  PCP:  Myrlene BrokerElizabeth A Crawford, MD  Cardiologist:   Tobias AlexanderKatarina Estelle Greenleaf, MD   Chief complaint: 6 months follow-up, transition from Dr. Patty SermonsBrackbill.  History of Present Illness:  Ryan Lamb is a 60 y.o. male with prior medical history of aortic stenosis in a trileaflet valve, status post aVR with 23 mm St. to mechanical prosthesis in 1994 by Dr. Tyrone SageGerhardt. In December of 2011 because of a chest x-ray suggestive of dilatation of his descending aorta he was seen again and has had CT scans of the chest in 2011, 2012,2013, 2014 , 2015, 2016 followed by Dr. Tyrone SageGerhardt, he's last maximum diameter was 49 mm that was stable since 2011. He has no family history of connective tissue or Marfan's disorder. He is to be followed by Dr. Tyrone SageGerhardt in December 2017 with repeat CTA.  The patient is being followed in our Coumadin clinic. He has been feeling well continues to walk 3 times a week he does some pushups and overall feels well with mild worsening of his stamina during exercise in the last couple years. Denies any chest pain no shortness of breath other than on moderate exertion, no lower extremity edema, claudications, palpitations or syncope no orthopnea or proximal nocturnal dyspnea. He has been having problems with his chronic right ear infection for which he sees an ENT physician. His labs are being followed by his primary care physician.  Past Medical History  Diagnosis Date  . Aortic stenosis   . Aortic valve prosthesis present   . Hypercholesterolemia   . Dissecting aortic aneurysm (any part), thoracic (HCC)   . Mechanical heart valve present     Past Surgical History  Procedure Laterality Date  . Cardiac valve replacement    . Exploration post operative open heart  1994, 1972  . Esophagogastroduodenoscopy  01/2010, 04/2010  . Eec      with propofol     Current  Medications: Outpatient Prescriptions Prior to Visit  Medication Sig Dispense Refill  . aspirin 325 MG tablet Take 325 mg by mouth as needed for mild pain or headache.     Marland Kitchen. atorvastatin (LIPITOR) 10 MG tablet Take 1 tablet (10 mg total) by mouth daily. 30 tablet 5  . MULTIPLE VITAMIN PO Take 1 tablet by mouth daily.     . pantoprazole (PROTONIX) 20 MG tablet Take 20 mg by mouth daily as needed for heartburn or indigestion.    Marland Kitchen. warfarin (COUMADIN) 5 MG tablet Take as directed by Coumadin clinic 90 tablet 1   No facility-administered medications prior to visit.     Allergies:   Penicillins   Social History   Social History  . Marital Status: Single    Spouse Name: N/A  . Number of Children: N/A  . Years of Education: N/A   Social History Main Topics  . Smoking status: Former Smoker -- 0.50 packs/day for 5 years    Quit date: 10/17/1977  . Smokeless tobacco: Never Used  . Alcohol Use: No  . Drug Use: No  . Sexual Activity: Not Asked   Other Topics Concern  . None   Social History Narrative     Family History:  The patient's family history includes Coronary artery disease in his father and mother; Skin cancer in his father. There is no history of GI problems.   ROS:   Please see the history  of present illness.    ROS All other systems reviewed and are negative. Heart: 3/6 diastolic murmur, mechanical valve click, no rubs, or gallops,no edema  Respiratory:  clear to auscultation bilaterally, normal work of breathing GI: soft, nontender, nondistended, + BS MS: no deformity or atrophy Skin: warm and dry, no rash Neuro:  Alert and Oriented x 3, Strength and sensation are intact Psych: euthymic mood, full affect  Wt Readings from Last 3 Encounters:  07/26/15 166 lb (75.297 kg)  12/29/14 165 lb (74.844 kg)  11/21/14 164 lb 12.8 oz (74.753 kg)    Studies/Labs Reviewed:   EKG:  EKG is ordered today.  The ekg ordered today demonstrates Normal sinus rhythm with one PVC  otherwise unchanged.  Recent Labs: 11/21/2014: ALT 19; BUN 11; Creatinine, Ser 1.03; Hemoglobin 15.8; Platelets 229.0; Potassium 4.2; Sodium 142   Lipid Panel    Component Value Date/Time   CHOL 174 11/21/2014 1447   TRIG 137.0 11/21/2014 1447   HDL 47.30 11/21/2014 1447   CHOLHDL 4 11/21/2014 1447   VLDL 27.4 11/21/2014 1447   LDLCALC 100* 11/21/2014 1447   Additional studies/ records that were reviewed today include:   CTA: 12/2014 FINDINGS: Ascending thoracic aortic aneurysm again noted measuring 4.9 cm in greatest diameter compared with 4.8 cm previously. Proximal aortic arch 4.6 cm. Distal aortic arch 2.7 cm. Proximal descending thoracic aorta 2.5 cm.  Heart is borderline in size. Prior aortic valve replacement. Calcifications throughout the left anterior descending coronary artery.  No mediastinal, hilar, or axillary adenopathy. Chest wall soft tissues are unremarkable. Imaging into the upper abdomen shows no acute findings.  Stable 4 mm nodule in the medial lingula on image 29. Lungs are otherwise clear. No focal airspace opacities or suspicious nodules. No effusions. Imaging into the upper abdomen shows no acute findings. No acute bony abnormality.  Review of the MIP images confirms the above findings.  IMPRESSION: Stable ascending thoracic aortic aneurysm, 4.9 cm maximally compared to 4.8 cm previously .  Echocardiogram performed in 2011 - Left ventricle: The cavity size was normal. Wall thickness was  increased in a pattern of mild LVH. Systolic function was normal.  The estimated ejection fraction was in the range of 60% to 65%.  Wall motion was normal; there were no regional wall motion  abnormalities. Doppler parameters are consistent with abnormal  left ventricular relaxation (grade 1 diastolic dysfunction). - Aortic valve: A mechanical prosthesis was present. Mild  regurgitation. No significant perivalvular regurgitation. -  Ascending aorta: The ascending aorta was moderately dilated. Impressions:  - Moderately dilated ascending aorta; suggest CTA or MRA to better  assess.    ASSESSMENT:    1. S/P AVR   2. Aortic valve disorder   3. Long term (current) use of anticoagulants   4. Aortic valve prosthesis present   5. Ascending aortic aneurysm (HCC)   6. Gastroesophageal reflux disease without esophagitis   7. Hx of mechanical aortic valve replacement      PLAN:  In order of problems listed above:  The patient seems to be stable with some worsening of his stamina, we'll repeat an echocardiogram since none was done in the last 6 years to evaluate for his mechanical aortic valve. At the last echocardiogram in 2011 he had normal transaortic gradient and mild aortic regurgitation. Ascending aortic aneurysm is being followed by Dr. Tyrone Sage and has been stable for last 6 years he is to follow with another CT in December 2017. His schedule for colonoscopy in  August, he is advised by Coumadin clinic to stop Coumadin 5 days prior and restart based on instructions from a GI physician.   Medication Adjustments/Labs and Tests Ordered: Current medicines are reviewed at length with the patient today.  Concerns regarding medicines are outlined above.  Medication changes, Labs and Tests ordered today are listed in the Patient Instructions below. Patient Instructions  Medication Instructions:  Your physician recommends that you continue on your current medications as directed. Please refer to the Current Medication list given to you today.   Labwork: None ordered  Testing/Procedures: Your physician has requested that you have an echocardiogram. Echocardiography is a painless test that uses sound waves to create images of your heart. It provides your doctor with information about the size and shape of your heart and how well your heart's chambers and valves are working. This procedure takes approximately one hour.  There are no restrictions for this procedure.    Follow-Up: Your physician wants you to follow-up in: 6 months with Dr.Kevon Tench You will receive a reminder letter in the mail two months in advance. If you don't receive a letter, please call our office to schedule the follow-up appointment.   Any Other Special Instructions Will Be Listed Below (If Applicable).     If you need a refill on your cardiac medications before your next appointment, please call your pharmacy.       Signed, Tobias Alexander, MD  07/26/2015 9:11 AM    [facility] [address] [place], [place], [place]  [address] Phone: [phone]; Fax: [phone]

## 2015-07-26 NOTE — Addendum Note (Signed)
Addended by: Jarvis NewcomerPARRIS-GODLEY, LISA S on: 07/26/2015 09:17 AM   Modules accepted: Orders

## 2015-08-10 ENCOUNTER — Ambulatory Visit (HOSPITAL_COMMUNITY): Payer: BLUE CROSS/BLUE SHIELD | Attending: Cardiology

## 2015-08-10 ENCOUNTER — Other Ambulatory Visit: Payer: Self-pay

## 2015-08-10 DIAGNOSIS — Z954 Presence of other heart-valve replacement: Secondary | ICD-10-CM | POA: Diagnosis not present

## 2015-08-10 DIAGNOSIS — I5189 Other ill-defined heart diseases: Secondary | ICD-10-CM | POA: Insufficient documentation

## 2015-08-10 DIAGNOSIS — I77810 Thoracic aortic ectasia: Secondary | ICD-10-CM | POA: Diagnosis not present

## 2015-08-10 DIAGNOSIS — Z952 Presence of prosthetic heart valve: Secondary | ICD-10-CM | POA: Diagnosis not present

## 2015-08-10 DIAGNOSIS — Z23 Encounter for immunization: Secondary | ICD-10-CM | POA: Diagnosis not present

## 2015-08-14 ENCOUNTER — Telehealth: Payer: Self-pay | Admitting: *Deleted

## 2015-08-14 DIAGNOSIS — Z7901 Long term (current) use of anticoagulants: Secondary | ICD-10-CM

## 2015-08-14 DIAGNOSIS — I7121 Aneurysm of the ascending aorta, without rupture: Secondary | ICD-10-CM

## 2015-08-14 DIAGNOSIS — I712 Thoracic aortic aneurysm, without rupture: Secondary | ICD-10-CM

## 2015-08-14 DIAGNOSIS — E7800 Pure hypercholesterolemia, unspecified: Secondary | ICD-10-CM

## 2015-08-14 NOTE — Telephone Encounter (Signed)
-----   Message from Lars MassonKatarina H Nelson, MD sent at 08/14/2015  7:40 AM EDT ----- He has normal LVEF and normal gradients across the mechanical aortic valve consistent with normally functioning valve. I would suggest a nuclear stress test to further evaluate for decreased stamina during exercise. Thank you, KN

## 2015-08-14 NOTE — Telephone Encounter (Signed)
RE: lexiscan per Nelson/schedule him for 08-25-15/lvmom to call and confirm appointment/saf

## 2015-08-14 NOTE — Telephone Encounter (Signed)
Notified the pt that per Dr Delton SeeNelson, his echo showed normal LVEF and normal gradients across the mechanical aortic valve consistent with normally functioning valve.  Informed the pt that Dr Delton SeeNelson did suggest a nuclear stress test to further evaluate for decreased stamina during exercise.  Informed the pt that Dr Delton SeeNelson wants to order a lexiscan on him.   Clarified this order with Dr Delton SeeNelson via telephone order.  Informed the pt that I will place the order in the system and someone [facility] will call him back to have this arranged.  Went over [employer] instructions with the pt over the phone.  Informed him that he may take all his meds with water, when doing this test.  Informed him this is not contraindicated.  Pt verbalized understanding and agrees with this plan.

## 2015-08-14 NOTE — Telephone Encounter (Signed)
-----   Message from Katarina H Nelson, MD sent at 08/14/2015  7:40 AM EDT ----- He has normal LVEF and normal gradients across the mechanical aortic valve consistent with normally functioning valve. I would suggest a nuclear stress test to further evaluate for decreased stamina during exercise. Thank you, KN 

## 2015-08-16 DIAGNOSIS — K640 First degree hemorrhoids: Secondary | ICD-10-CM | POA: Diagnosis not present

## 2015-08-16 DIAGNOSIS — K5730 Diverticulosis of large intestine without perforation or abscess without bleeding: Secondary | ICD-10-CM | POA: Diagnosis not present

## 2015-08-16 DIAGNOSIS — D125 Benign neoplasm of sigmoid colon: Secondary | ICD-10-CM | POA: Diagnosis not present

## 2015-08-16 DIAGNOSIS — Z86010 Personal history of colonic polyps: Secondary | ICD-10-CM | POA: Diagnosis not present

## 2015-08-16 DIAGNOSIS — K635 Polyp of colon: Secondary | ICD-10-CM | POA: Diagnosis not present

## 2015-08-16 LAB — HM COLONOSCOPY

## 2015-08-25 ENCOUNTER — Ambulatory Visit (INDEPENDENT_AMBULATORY_CARE_PROVIDER_SITE_OTHER): Payer: BLUE CROSS/BLUE SHIELD | Admitting: Pharmacist

## 2015-08-25 ENCOUNTER — Encounter (HOSPITAL_COMMUNITY): Payer: BLUE CROSS/BLUE SHIELD

## 2015-08-25 DIAGNOSIS — Z954 Presence of other heart-valve replacement: Secondary | ICD-10-CM

## 2015-08-25 DIAGNOSIS — Z7901 Long term (current) use of anticoagulants: Secondary | ICD-10-CM

## 2015-08-25 DIAGNOSIS — Z952 Presence of prosthetic heart valve: Secondary | ICD-10-CM

## 2015-08-25 DIAGNOSIS — I359 Nonrheumatic aortic valve disorder, unspecified: Secondary | ICD-10-CM | POA: Diagnosis not present

## 2015-08-25 LAB — POCT INR: INR: 2.3

## 2015-08-31 ENCOUNTER — Telehealth (HOSPITAL_COMMUNITY): Payer: Self-pay | Admitting: *Deleted

## 2015-08-31 NOTE — Telephone Encounter (Signed)
Patient given detailed instructions per Myocardial Perfusion Study Information Sheet for the test on 09/04/15. Patient notified to arrive 15 minutes early and that it is imperative to arrive on time for appointment to keep from having the test rescheduled.  If you need to cancel or reschedule your appointment, please call the office within 24 hours of your appointment. Failure to do so may result in a cancellation of your appointment, and a $50 no show fee. Patient verbalized understanding.Dayna Alia J Dyane Broberg, RN  

## 2015-09-04 ENCOUNTER — Ambulatory Visit (HOSPITAL_COMMUNITY): Payer: BLUE CROSS/BLUE SHIELD | Attending: Cardiology

## 2015-09-04 DIAGNOSIS — Z8249 Family history of ischemic heart disease and other diseases of the circulatory system: Secondary | ICD-10-CM | POA: Insufficient documentation

## 2015-09-04 DIAGNOSIS — E7800 Pure hypercholesterolemia, unspecified: Secondary | ICD-10-CM | POA: Diagnosis not present

## 2015-09-04 DIAGNOSIS — R5383 Other fatigue: Secondary | ICD-10-CM | POA: Diagnosis not present

## 2015-09-04 DIAGNOSIS — I712 Thoracic aortic aneurysm, without rupture: Secondary | ICD-10-CM | POA: Insufficient documentation

## 2015-09-04 DIAGNOSIS — Z7901 Long term (current) use of anticoagulants: Secondary | ICD-10-CM | POA: Diagnosis not present

## 2015-09-04 DIAGNOSIS — R9439 Abnormal result of other cardiovascular function study: Secondary | ICD-10-CM | POA: Diagnosis not present

## 2015-09-04 DIAGNOSIS — I7121 Aneurysm of the ascending aorta, without rupture: Secondary | ICD-10-CM

## 2015-09-04 LAB — MYOCARDIAL PERFUSION IMAGING
LV dias vol: 80 mL (ref 62–150)
LV sys vol: 30 mL
Peak HR: 89 {beats}/min
RATE: 0.3
Rest HR: 63 {beats}/min
SDS: 4
SRS: 2
SSS: 6
TID: 1.02

## 2015-09-04 MED ORDER — TECHNETIUM TC 99M TETROFOSMIN IV KIT
33.0000 | PACK | Freq: Once | INTRAVENOUS | Status: AC | PRN
  Administered 2015-09-04: 33 via INTRAVENOUS
  Filled 2015-09-04: qty 33

## 2015-09-04 MED ORDER — REGADENOSON 0.4 MG/5ML IV SOLN
0.4000 mg | Freq: Once | INTRAVENOUS | Status: AC
  Administered 2015-09-04: 0.4 mg via INTRAVENOUS

## 2015-09-04 MED ORDER — TECHNETIUM TC 99M TETROFOSMIN IV KIT
10.6000 | PACK | Freq: Once | INTRAVENOUS | Status: AC | PRN
  Administered 2015-09-04: 11 via INTRAVENOUS
  Filled 2015-09-04: qty 11

## 2015-09-07 ENCOUNTER — Telehealth: Payer: Self-pay | Admitting: Cardiology

## 2015-09-07 NOTE — Telephone Encounter (Signed)
Notes Recorded by Loa SocksIvy M Zakariyah Freimark, LPN on 1/61/09608/31/2017 at 5:03 PM EDT Notified the patient that per Dr Delton SeeNelson, his stress test is borderline affected by artifact but overall normal.  Informed the pt that per Dr Delton SeeNelson, at this point she doesn't feel that further ischemic testing is necessary, if his symptoms of fatigue or shortness of breath have not worsened.  Per the pt, he states that his symptoms have not worsened.  Informed the pt that per Dr Delton SeeNelson, if his symptoms of fatigue and sob do worsen, then he should notify our office of this. Informed the pt that we will follow-up with him as already planned at his last OV, unless there is a change in condition. Pt verbalized understanding, agrees with this plan, and gracious for all the assistance provided.

## 2015-09-07 NOTE — Telephone Encounter (Signed)
New Message ° °Pt returning RN call. Please call back to discuss  °

## 2015-09-22 ENCOUNTER — Ambulatory Visit (INDEPENDENT_AMBULATORY_CARE_PROVIDER_SITE_OTHER): Payer: BLUE CROSS/BLUE SHIELD | Admitting: *Deleted

## 2015-09-22 DIAGNOSIS — I359 Nonrheumatic aortic valve disorder, unspecified: Secondary | ICD-10-CM | POA: Diagnosis not present

## 2015-09-22 DIAGNOSIS — Z7901 Long term (current) use of anticoagulants: Secondary | ICD-10-CM

## 2015-09-22 DIAGNOSIS — Z954 Presence of other heart-valve replacement: Secondary | ICD-10-CM

## 2015-09-22 DIAGNOSIS — Z952 Presence of prosthetic heart valve: Secondary | ICD-10-CM

## 2015-09-22 LAB — POCT INR: INR: 3.2

## 2015-10-20 ENCOUNTER — Ambulatory Visit (INDEPENDENT_AMBULATORY_CARE_PROVIDER_SITE_OTHER): Payer: BLUE CROSS/BLUE SHIELD | Admitting: *Deleted

## 2015-10-20 DIAGNOSIS — Z7901 Long term (current) use of anticoagulants: Secondary | ICD-10-CM

## 2015-10-20 DIAGNOSIS — Z952 Presence of prosthetic heart valve: Secondary | ICD-10-CM

## 2015-10-20 DIAGNOSIS — I359 Nonrheumatic aortic valve disorder, unspecified: Secondary | ICD-10-CM | POA: Diagnosis not present

## 2015-10-20 LAB — POCT INR: INR: 2.6

## 2015-11-17 ENCOUNTER — Ambulatory Visit (INDEPENDENT_AMBULATORY_CARE_PROVIDER_SITE_OTHER): Payer: BLUE CROSS/BLUE SHIELD | Admitting: *Deleted

## 2015-11-17 DIAGNOSIS — Z952 Presence of prosthetic heart valve: Secondary | ICD-10-CM | POA: Diagnosis not present

## 2015-11-17 DIAGNOSIS — I359 Nonrheumatic aortic valve disorder, unspecified: Secondary | ICD-10-CM

## 2015-11-17 DIAGNOSIS — Z7901 Long term (current) use of anticoagulants: Secondary | ICD-10-CM

## 2015-11-17 LAB — POCT INR: INR: 2

## 2015-11-20 ENCOUNTER — Other Ambulatory Visit: Payer: Self-pay | Admitting: *Deleted

## 2015-11-20 ENCOUNTER — Ambulatory Visit: Payer: BLUE CROSS/BLUE SHIELD | Admitting: Family Medicine

## 2015-11-20 DIAGNOSIS — I7121 Aneurysm of the ascending aorta, without rupture: Secondary | ICD-10-CM

## 2015-11-20 DIAGNOSIS — I712 Thoracic aortic aneurysm, without rupture: Secondary | ICD-10-CM

## 2015-12-07 ENCOUNTER — Encounter: Payer: Self-pay | Admitting: Family Medicine

## 2015-12-07 ENCOUNTER — Ambulatory Visit (INDEPENDENT_AMBULATORY_CARE_PROVIDER_SITE_OTHER): Payer: BLUE CROSS/BLUE SHIELD | Admitting: Family Medicine

## 2015-12-07 VITALS — BP 118/76 | HR 77 | Temp 98.6°F | Ht 69.0 in | Wt 162.2 lb

## 2015-12-07 DIAGNOSIS — I712 Thoracic aortic aneurysm, without rupture: Secondary | ICD-10-CM | POA: Diagnosis not present

## 2015-12-07 DIAGNOSIS — I7121 Aneurysm of the ascending aorta, without rupture: Secondary | ICD-10-CM

## 2015-12-07 DIAGNOSIS — Z0000 Encounter for general adult medical examination without abnormal findings: Secondary | ICD-10-CM

## 2015-12-07 DIAGNOSIS — Z114 Encounter for screening for human immunodeficiency virus [HIV]: Secondary | ICD-10-CM | POA: Diagnosis not present

## 2015-12-07 LAB — COMPREHENSIVE METABOLIC PANEL
ALK PHOS: 51 U/L (ref 39–117)
ALT: 18 U/L (ref 0–53)
AST: 19 U/L (ref 0–37)
Albumin: 4.5 g/dL (ref 3.5–5.2)
BUN: 12 mg/dL (ref 6–23)
CO2: 32 meq/L (ref 19–32)
Calcium: 9.4 mg/dL (ref 8.4–10.5)
Chloride: 105 mEq/L (ref 96–112)
Creatinine, Ser: 0.96 mg/dL (ref 0.40–1.50)
GFR: 85.15 mL/min (ref >60.00)
GLUCOSE: 80 mg/dL (ref 70–99)
POTASSIUM: 4.1 meq/L (ref 3.5–5.1)
SODIUM: 142 meq/L (ref 135–145)
TOTAL PROTEIN: 6.7 g/dL (ref 6.0–8.3)
Total Bilirubin: 0.7 mg/dL (ref 0.2–1.2)

## 2015-12-07 LAB — POC URINALSYSI DIPSTICK (AUTOMATED)
BILIRUBIN UA: NEGATIVE
GLUCOSE UA: NEGATIVE
Ketones, UA: NEGATIVE
Leukocytes, UA: NEGATIVE
NITRITE UA: NEGATIVE
Protein, UA: NEGATIVE
RBC UA: NEGATIVE
Spec Grav, UA: 1.02
Urobilinogen, UA: 0.2
pH, UA: 6

## 2015-12-07 LAB — LIPID PANEL
CHOL/HDL RATIO: 4
Cholesterol: 182 mg/dL (ref 0–200)
HDL: 48.6 mg/dL (ref >39.00)
LDL Cholesterol: 101 mg/dL — ABNORMAL HIGH (ref 0–99)
NONHDL: 133.79
Triglycerides: 162 mg/dL — ABNORMAL HIGH (ref 0.0–149.0)
VLDL: 32.4 mg/dL (ref 0.0–40.0)

## 2015-12-07 LAB — CBC
HCT: 46.5 % (ref 39.0–52.0)
Hemoglobin: 15.7 g/dL (ref 13.0–17.0)
MCHC: 33.7 g/dL (ref 30.0–36.0)
MCV: 89.5 fl (ref 78.0–100.0)
PLATELETS: 219 10*3/uL (ref 150.0–400.0)
RBC: 5.2 Mil/uL (ref 4.22–5.81)
RDW: 13.4 % (ref 11.5–15.5)
WBC: 6.4 10*3/uL (ref 4.0–10.5)

## 2015-12-07 LAB — TSH: TSH: 1.57 u[IU]/mL (ref 0.35–4.50)

## 2015-12-07 LAB — PSA: PSA: 0.87 ng/mL (ref 0.10–4.00)

## 2015-12-07 NOTE — Progress Notes (Signed)
Phone: [phone]  Subjective:  Patient presents today to establish care. Did not have PCP then saw Dr. Okey Duprerawford last November. Dr. Patty SermonsBrackbill had recommended seeing me so waited on appointment with me. Chief complaint-noted.   See problem oriented charting  The following were reviewed and entered/updated in epic: Past Medical History:  Diagnosis Date  . Aortic stenosis   . Aortic valve prosthesis present   . Dissecting aortic aneurysm (any part), thoracic (HCC)   . Diverticulosis   . Gastric ulcer    due to nsaids in past  . Hypercholesterolemia   . Mechanical heart valve present    Patient Active Problem List   Diagnosis Date Noted  . Ascending aortic aneurysm (HCC) 10/18/2010    Priority: High  . Hx of mechanical aortic valve replacement     Priority: High  . GERD (gastroesophageal reflux disease) 04/22/2011    Priority: Medium  . Hypercholesterolemia     Priority: Medium  . Long term (current) use of anticoagulants 05/08/2010    Priority: Low   Past Surgical History:  Procedure Laterality Date  . CARDIAC VALVE REPLACEMENT    . EEC     with propofol   . ESOPHAGOGASTRODUODENOSCOPY  01/2010, 04/2010  . ESOPHAGOGASTRODUODENOSCOPY     cauterize after nsaid related ulcer  . EXPLORATION POST OPERATIVE OPEN HEART  1994, 1972    Family History  Problem Relation Age of Onset  . Coronary artery disease Mother     CABG  . Coronary artery disease Father     aortic valve replacement  . Skin cancer Father   . Healthy Sister   . Healthy Brother     overweight  . Schizophrenia Sister   . GI problems Neg Hx     Medications- reviewed and updated Current Outpatient Prescriptions  Medication Sig Dispense Refill  . aspirin 325 MG tablet Take 325 mg by mouth as needed for mild pain or headache.     Marland Kitchen. atorvastatin (LIPITOR) 10 MG tablet Take 1 tablet (10 mg total) by mouth daily. 30 tablet 5  . MULTIPLE VITAMIN PO Take 1 tablet by mouth daily.     . pantoprazole (PROTONIX)  20 MG tablet Take 20 mg by mouth daily as needed for heartburn or indigestion.    Marland Kitchen. warfarin (COUMADIN) 5 MG tablet Take as directed by Coumadin clinic 90 tablet 1   No current facility-administered medications for this visit.     Allergies-reviewed and updated Allergies  Allergen Reactions  . Penicillins Rash    Reaction unknown may have been a rash    Social History   Social History  . Marital status: Single    Spouse name: N/A  . Number of children: N/A  . Years of education: N/A   Social History Main Topics  . Smoking status: Former Smoker    Packs/day: 0.50    Years: 5.00    Quit date: 10/17/1977  . Smokeless tobacco: Never Used  . Alcohol use No  . Drug use: No  . Sexual activity: Not Asked   Other Topics Concern  . None   Social History Narrative   Single. Lives with parents right now.       Undergrad at [employer], Masters [employer]- divinity. Hard to get into ministry.    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  HENT: Negative for hearing loss and tinnitus.   Eyes: Negative for blurred vision and double vision.  Respiratory: Negative for cough and hemoptysis.   Cardiovascular: Negative for chest pain and palpitations.  Gastrointestinal: Positive for heartburn. Negative for nausea.  Genitourinary: Negative for dysuria and urgency.  Musculoskeletal: Negative for myalgias and neck pain.  Skin: Negative for itching and rash.  Neurological: Negative for tingling and tremors.  Endo/Heme/Allergies: Negative for polydipsia. Does not bruise/bleed easily.  Psychiatric/Behavioral: Negative for hallucinations and substance abuse.   Objective: BP 118/76 (BP Location: Left Arm, Patient Position: Sitting, Cuff Size: Large)   Pulse 77   Temp 98.6 F (37 C) (Oral)   Ht 5\' 9"  (1.753 m)   Wt 162 lb  3.2 oz (73.6 kg)   SpO2 97%   BMI 23.95 kg/m  Gen: NAD, resting comfortably HEENT: Mucous membranes are moist. Oropharynx normal. TM normal. Eyes: sclera and lids normal, PERRLA Neck: no thyromegaly, no cervical lymphadenopathy CV: RRR systolic mechanical murmur  Lungs: CTAB no crackles, wheeze, rhonchi Abdomen: soft/nontender/nondistended/normal bowel sounds. No rebound or guarding.  Ext: no edema, 2+ PT pulses Skin: warm, dry, no rash Neuro: 5/5 strength in upper and lower extremities, normal gait, normal reflexes  Assessment/Plan:  60 y.o. male presenting for annual physical.  Health Maintenance counseling: 1. Anticipatory guidance: Patient counseled regarding regular dental exams, eye exams- last 3 years ago, wearing seatbelts.  2. Risk factor reduction:  Advised patient of need for regular exercise (regular walking a mile to 3 miles most days) and diet rich and fruits and vegetables to reduce risk of heart attack and stroke. Wants to be about 5 lbs thinner 3. Immunizations/screenings/ancillary studies Immunization History  Administered Date(s) Administered  . Influenza,inj,Quad PF,36+ Mos 11/21/2014, 08/10/2015  . Tdap 11/21/2014   Health Maintenance Due  Topic Date Due  . HIV Screening - add in today  09/29/1971  4. Prostate cancer screening-  check psa and rectal. Opts in for now. Low risk rectal 5. Colon cancer screening - had this year- getting records 6. Skin cancer prevention- advised regular sunscreen use. Dermatology a few years ago but no cancer  Mom with thyroid issues so check TSH  Aortic aneurysm- will see CTS within a month and have updated CT  Dr. Patty SermonsBrackbill--> now Dr. Delton SeeNelson  Dr. Brion Alimentrossly ENT. inner right ear issues.   1 year CPE   One piece of candy today Orders Placed This Encounter  Procedures  . CBC    [place]  . Comprehensive metabolic panel    [place]    Order Specific Question:   Has the patient fasted?    Answer:   No  . Lipid panel     [place]    Order Specific Question:   Has the patient fasted?    Answer:   No  . PSA  . TSH    [place]  . POCT Urinalysis Dipstick (Automated)   Return precautions advised.  Tana ConchStephen Hunter, MD

## 2015-12-07 NOTE — Progress Notes (Signed)
Pre visit review using our clinic review tool, if applicable. No additional management support is needed unless otherwise documented below in the visit note. 

## 2015-12-07 NOTE — Patient Instructions (Addendum)
Sign release of information at the check out desk for last colonoscopy from Dr. Duffy RhodySchooler  Labs before you leave  Ryan HeinzGreat to have you as a patient

## 2015-12-08 ENCOUNTER — Ambulatory Visit (INDEPENDENT_AMBULATORY_CARE_PROVIDER_SITE_OTHER): Payer: BLUE CROSS/BLUE SHIELD | Admitting: *Deleted

## 2015-12-08 ENCOUNTER — Other Ambulatory Visit: Payer: Self-pay | Admitting: Otolaryngology

## 2015-12-08 DIAGNOSIS — H90A22 Sensorineural hearing loss, unilateral, left ear, with restricted hearing on the contralateral side: Secondary | ICD-10-CM | POA: Diagnosis not present

## 2015-12-08 DIAGNOSIS — J322 Chronic ethmoidal sinusitis: Secondary | ICD-10-CM | POA: Diagnosis not present

## 2015-12-08 DIAGNOSIS — Z7901 Long term (current) use of anticoagulants: Secondary | ICD-10-CM

## 2015-12-08 DIAGNOSIS — H90A31 Mixed conductive and sensorineural hearing loss, unilateral, right ear with restricted hearing on the contralateral side: Secondary | ICD-10-CM | POA: Diagnosis not present

## 2015-12-08 DIAGNOSIS — H9311 Tinnitus, right ear: Secondary | ICD-10-CM

## 2015-12-08 DIAGNOSIS — I359 Nonrheumatic aortic valve disorder, unspecified: Secondary | ICD-10-CM

## 2015-12-08 DIAGNOSIS — Z5181 Encounter for therapeutic drug level monitoring: Secondary | ICD-10-CM

## 2015-12-08 DIAGNOSIS — Z952 Presence of prosthetic heart valve: Secondary | ICD-10-CM

## 2015-12-08 DIAGNOSIS — H9041 Sensorineural hearing loss, unilateral, right ear, with unrestricted hearing on the contralateral side: Secondary | ICD-10-CM | POA: Diagnosis not present

## 2015-12-08 DIAGNOSIS — J320 Chronic maxillary sinusitis: Secondary | ICD-10-CM | POA: Diagnosis not present

## 2015-12-08 LAB — POCT INR: INR: 3.3

## 2015-12-11 ENCOUNTER — Encounter: Payer: Self-pay | Admitting: Family Medicine

## 2015-12-11 DIAGNOSIS — Z86010 Personal history of colonic polyps: Secondary | ICD-10-CM | POA: Insufficient documentation

## 2015-12-12 ENCOUNTER — Encounter: Payer: Self-pay | Admitting: Family Medicine

## 2015-12-13 ENCOUNTER — Encounter: Payer: Self-pay | Admitting: Family Medicine

## 2015-12-13 ENCOUNTER — Telehealth: Payer: Self-pay | Admitting: Family Medicine

## 2015-12-13 NOTE — Telephone Encounter (Signed)
Pt is returning jamie call °

## 2015-12-14 ENCOUNTER — Other Ambulatory Visit: Payer: Self-pay | Admitting: Family Medicine

## 2015-12-14 MED ORDER — ATORVASTATIN CALCIUM 20 MG PO TABS: 20.0000 mg | ORAL_TABLET | Freq: Every day | ORAL | 3 refills | Status: DC

## 2015-12-18 ENCOUNTER — Other Ambulatory Visit: Payer: Self-pay | Admitting: Cardiology

## 2015-12-18 ENCOUNTER — Ambulatory Visit
Admission: RE | Admit: 2015-12-18 | Discharge: 2015-12-18 | Disposition: A | Discharge status: Home / Self Care | Payer: BLUE CROSS/BLUE SHIELD | Source: Ambulatory Visit | Attending: Otolaryngology | Admitting: Otolaryngology

## 2015-12-18 DIAGNOSIS — H9313 Tinnitus, bilateral: Secondary | ICD-10-CM | POA: Diagnosis not present

## 2015-12-18 DIAGNOSIS — H9311 Tinnitus, right ear: Secondary | ICD-10-CM

## 2015-12-19 DIAGNOSIS — H9311 Tinnitus, right ear: Secondary | ICD-10-CM | POA: Diagnosis not present

## 2015-12-19 DIAGNOSIS — H9041 Sensorineural hearing loss, unilateral, right ear, with unrestricted hearing on the contralateral side: Secondary | ICD-10-CM | POA: Diagnosis not present

## 2015-12-28 ENCOUNTER — Ambulatory Visit
Admission: RE | Admit: 2015-12-28 | Discharge: 2015-12-28 | Disposition: A | Discharge status: Home / Self Care | Payer: BLUE CROSS/BLUE SHIELD | Source: Ambulatory Visit | Attending: Cardiothoracic Surgery | Admitting: Cardiothoracic Surgery

## 2015-12-28 ENCOUNTER — Ambulatory Visit (INDEPENDENT_AMBULATORY_CARE_PROVIDER_SITE_OTHER): Payer: BLUE CROSS/BLUE SHIELD | Admitting: Cardiothoracic Surgery

## 2015-12-28 ENCOUNTER — Encounter: Payer: Self-pay | Admitting: Cardiothoracic Surgery

## 2015-12-28 VITALS — BP 124/79 | HR 82 | Resp 20 | Ht 69.0 in | Wt 162.0 lb

## 2015-12-28 DIAGNOSIS — I7121 Aneurysm of the ascending aorta, without rupture: Secondary | ICD-10-CM

## 2015-12-28 DIAGNOSIS — I712 Thoracic aortic aneurysm, without rupture: Secondary | ICD-10-CM | POA: Diagnosis not present

## 2015-12-28 MED ORDER — IOPAMIDOL (ISOVUE-370) INJECTION 76%
75.0000 mL | Freq: Once | INTRAVENOUS | Status: AC | PRN
  Administered 2015-12-28: 75 mL via INTRAVENOUS

## 2015-12-28 NOTE — Progress Notes (Signed)
[address]       [place] [address]             [phone]         Sullivan Lone [facility] Medical Record #[id number] Date of Birth: 12/14/1955  Referring: Cassell Clement, MD Primary Care: Tana Conch, MD  Chief Complaint:    Chief Complaint  Patient presents with  . Thoracic Aortic Aneurysm    1 year f/u with CTA Chest    History of Present Illness:    Patient returns today with a followup CTA of the chest. In 1971 the patient had aortic valvuloplasty. Subsequently in 1994 I replaced his aortic valve with a #23 [employer] mechanical prosthesis. At the time of surgery   4/20 1994 He was noted to have a tri leaflet aortic valve.  In December of 2011 because of a chest x-ray suggestive of dilatation of his descending aorta he was seen again and has had CT scans of the chest in 2011, 2012,2013, 2014 , 2015  and today. He has no family history of connective tissue or Marfan's disorder. He remains asymptomatic. He notes he follows his blood pressure carefully and has not been hypertensive. He continues to follow endocarditis precautions especially with dental work. He had echocardiogram and nuclear stress test done in August 2017. He also had colonoscopy done at that time    Current Activity/ Functional Status: Patient remains active without any physical limitations    Past Medical History  Diagnosis Date  . Aortic stenosis as infant, with recurrent as and valve replacement   . Aortic valve prosthesis present   . Hypercholesterolemia     Past Surgical History  Procedure Date  . Cardiac valve replacement- mechanical aortic valve  1994     There is no family history of Marfan's or other connective tissue disorder or sudden death from dissection .  History   Social History  . Marital Status: Single    Spouse Name: N/A    Number of Children: N/A  . Years of Education: N/A   Occupational History  . Not on file.    Social History Main Topics  . Smoking status: Former Smoker -- 0.5 packs/day for 5 years    Quit date: 10/17/1977  . Smokeless tobacco: Never Used  . Alcohol Use: No  . Drug Use: No              Social History Narrative  . No narrative on file    History  Smoking Status  . Former Smoker  . Packs/day: 0.50  . Years: 5.00  . Quit date: 10/17/1977  Smokeless Tobacco  . Never Used    History  Alcohol Use No     Allergies  Allergen Reactions  . Penicillins Rash    Reaction unknown may have been a rash    Current Outpatient Prescriptions  Medication Sig Dispense Refill  . aspirin 325 MG tablet Take 325 mg by mouth as needed for mild pain or headache.     Marland Kitchen atorvastatin (LIPITOR) 20 MG tablet Take 1 tablet (20 mg total) by mouth daily. 90 tablet 3  . MULTIPLE VITAMIN PO Take 1 tablet by mouth daily.     . pantoprazole (PROTONIX) 20 MG tablet Take 20 mg by mouth daily as needed for heartburn or indigestion.    Marland Kitchen warfarin (COUMADIN)  5 MG tablet TAKE TABLETS BY MOUTH AS DIRECTED BY COUMADIN CLINIC 90 tablet 1   No current facility-administered medications for this visit.        Review of Systems:     Cardiac Review of Systems: Y or N  Chest Pain [  n  ]  Resting SOB [n   ] Exertional SOB  [ n ]  Orthopnea [  n]   Pedal Edema [   n]    Palpitations [ n ] Syncope  [ n ]   Presyncope [ n  ]  General Review of Systems: [Y] = yes [  ]=no Constitional: recent weight change [n  ]; anorexia [  ]; fatigue [  ]; nausea [  ]; night sweats [  ]; fever [  ]; or chills [  ];                                                                                                                                          Dental: poor dentition[n  ]; Last Dentist visit: yearly  Eye : blurred vision [  ]; diplopia [   ]; vision changes [  ];  Amaurosis fugax[  ]; Resp: cough [  ];  wheezing[  ];  hemoptysis[  ]; shortness of breath[  ]; paroxysmal nocturnal dyspnea[  ]; dyspnea on exertion[   ]; or orthopnea[  ];  GI:  gallstones[  ], vomiting[  ];  dysphagia[  ]; melena[ in past pylop removed ];  hematochezia [  ]; heartburn[  ];   Hx of  Colonoscopy[ y ]; GU: kidney stones [  ]; hematuria[  ];   dysuria [  ];  nocturia[  ];  history of     obstruction [  ];             Skin: rash, swelling[  ];, hair loss[  ];  peripheral edema[  ];  or itching[  ]; Musculosketetal: myalgias[  ];  joint swelling[  ];  joint erythema[  ];  joint pain[  ];  back pain[  ];  Heme/Lymph: bruising[on coumadin  ];  bleeding[  ];  anemia[  ];  Neuro: TIA[ n ];  headaches[  ];  stroke[  ];  vertigo[  ];  seizures[  ];   paresthesias[  ];  difficulty walking[  ];  Psych:depression[  ]; anxiety[  ];  Endocrine: diabetes[  ];  thyroid dysfunction[  ];  Immunizations: Flu [ y ]; Pneumococcal[n  ];  Other:  Physical Exam: BP 124/79   Pulse 82   Resp 20   Ht 5\' 9"  (1.753 m)   Wt 162 lb (73.5 kg)   SpO2 99% Comment: RA  BMI 23.92 kg/m   General appearance: alert, cooperative and appears stated age Neurologic: intact Heart: normal apical impulse and crisp mechanical valve sound no murmur of aortic insufficiency  Lungs: clear to auscultation bilaterally and normal percussion bilaterally Abdomen: soft, non-tender; bowel sounds normal; no masses,  no organomegaly Extremities: extremities normal, atraumatic, no cyanosis or edema and Homans sign is negative, no sign of DVT Wound: stable sternum  Diagnostic Studies & Laboratory data:     Recent Radiology Findings:  Ct Angio Chest Aorta W &/or Wo Contrast  Result Date: 12/28/2015 CLINICAL DATA:  Ascending aortic aneurysm.  Follow-up. EXAM: CT ANGIOGRAPHY CHEST WITH CONTRAST TECHNIQUE: Multidetector CT imaging of the chest was performed using the standard protocol during bolus administration of intravenous contrast. Multiplanar CT image reconstructions and MIPs were obtained to evaluate the vascular anatomy. CONTRAST:  75 mL Isovue 370 COMPARISON:   12/29/2014 FINDINGS: Cardiovascular: Unchanged ascending aortic aneurysm with maximal diameter of 4.8 cm. The aorta measures 4.6 cm at the sinuses of Valsalva and 3.2 cm at the sinotubular junction, the proximal transverse aorta measures 3.8 cm, distal transverse aorta measures 2.6 cm, proximal descending thoracic aorta measures 2.4 cm, and distal descending thoracic aorta measures 2.0 cm just above the hiatus, all not significantly changed when measured at a comparable location on the prior study. Prosthetic aortic valve. No central pulmonary embolus on this nondedicated study. LAD and left circumflex coronary artery calcification. No pericardial effusion. Mediastinum/Nodes: No enlarged axillary, mediastinal, or hilar lymph nodes. Unremarkable thyroid, trachea, and esophagus. Lungs/Pleura: No pleural effusion or pneumothorax. 5 mm medial left upper lobe nodule has not significantly changed in size allowing for differences in slice selection (series 5, image 58). No new nodules. Minimal right middle lobe scarring. Upper Abdomen: Unchanged 2.3 cm left upper pole renal cyst. Musculoskeletal: Bilateral gynecomastia. No acute osseous abnormality or suspicious osseous lesion. Review of the MIP images confirms the above findings. IMPRESSION: Unchanged 4.8 cm ascending aortic aneurysm. Electronically Signed   By: Sebastian AcheAllen  Grady M.D.   On: 12/28/2015 12:20   Ct Temporal Bones Wo Contrast  Result Date: 12/18/2015 CLINICAL DATA:  RIGHT greater than LEFT chronic tinnitus, worsening for 4 months. History of multiple treated ear infections. EXAM: CT TEMPORAL BONES WITHOUT CONTRAST TECHNIQUE: Axial and coronal plane CT imaging of the petrous temporal bones was performed with thin-collimation image reconstruction. No intravenous contrast was administered. Multiplanar CT image reconstructions were also generated. COMPARISON:  None. FINDINGS: RIGHT: External auditory canal is well formed and well aerated. Tympanic membrane is not  thickened or retracted. The scutum is sharp. Well aerated middle ear including Prussak's space. Ossicles are well formed and located. Patent aditus ad antrum. Well aerated mastoid air cells without coalescence. Intact tegmen tympani. Intact otic capsule with normal appearance of the inner ear structures. No internal auditory canal expansion. No definite cerebellar pontine angle masses. Mild RIGHT temporomandibular osteoarthrosis, pneumatized pedis apices with bony dehiscence RIGHT glenoid. LEFT: External auditory canal is well formed and aerated. Tympanic membrane is not thickened or retracted. The scutum is sharp. Well aerated middle ear including Prussak's space. Ossicles are well formed and located. Patent aditus ad antrum. Well aerated mastoid air cells without coalescence. Intact tegmen tympani. Intact otic capsule with normal appearance of the inner ear structures. No internal auditory canal expansion. No definite cerebellar pontine angle masses. Mild calcific atherosclerosis the carotid siphons. IMPRESSION: Bony dehiscence RIGHT temporomandibular joint/glenoid fossa. Otherwise negative bilateral temporal bone CT. Electronically Signed   By: Awilda Metroourtnay  Bloomer M.D.   On: 12/18/2015 16:57   Ct Angio Chest Aorta W/cm &/or Wo/cm  10/28/2013   CLINICAL DATA:  Ascending thoracic aortic aneurysm, followup, personal history of cardiac  valve replacement, cardiac surgery, quit smoking 30 years ago  EXAM: CT ANGIOGRAPHY CHEST WITH CONTRAST  TECHNIQUE: Multidetector CT imaging of the chest was performed using the standard protocol during bolus administration of intravenous contrast. Multiplanar CT image reconstructions and MIPs were obtained to evaluate the vascular anatomy.  CONTRAST:  80mL OMNIPAQUE IOHEXOL 350 MG/ML SOLN  COMPARISON:  10/08/2012, 09/12/2011  FINDINGS: Replacement aortic valve noted. Aortic root is dilated to 4.4 cm transverse diameter as it was previously. Mid ascending aorta is distended to 4.8  cm, unchanged. Proximal aortic arch is 3.8 cm, not significantly different from prior study. Distal aortic arch is 2.8 cm, stable. Proximal descending aorta is 2.6 cm, stable. Distal descending aorta is 2.1 cm, stable. No evidence of dissection or stenosis. The origins of the great vessels all appear normal. No significant calcified atherosclerotic plaque identified.  There is calcification of the left anterior descending artery. There is no significant pleural or pericardial effusion. Patient is status post previous median sternotomy. No significant hilar or mediastinal adenopathy.  4 mm pulmonary nodule image number 20 in the medial lingula is stable from 09/12/2011 and therefore considered benign. Stable cyst upper pole left kidney. No acute musculoskeletal findings.  Review of the MIP images confirms the above findings.  IMPRESSION: No change in ascending aortic aneurysm to a maximal diameter of 4.8 cm.   Electronically Signed   By: Esperanza Heir M.D.   On: 10/28/2013 09:30   Ct Angio Chest Aorta W/cm &/or Wo/cm  10/08/2012   CLINICAL DATA:  Thoracic aortic aneurysm, aortic stenosis, post mechanical prosthesis placement.  EXAM: CT ANGIOGRAPHY OF CHEST  TECHNIQUE: Multidetector CT imaging of the chest was performed during bolus injection of intravenous contrast. Multiplanar CT angiographic image reconstructions including MIPs were also generated to evaluate the vascular anatomy.  CONTRAST:  OMNIPAQUE IOHEXOL 350 MG/ML SOLN  COMPARISON:  09/12/2011  FINDINGS: Stable appearance of prosthetic aortic valve. Postop change of the aortic root which is ectatic measuring 4.4 cm maximum transverse diameter. Proximal ascending 4.8 cm, distal ascending 4.7 cm, proximal arch 3.7 cm, distal arch 2.8 cm, proximal descending 2.6 cm, and distal descending 2.1 cm just above the diaphragm. Negative for dissection or stenosis. No significant atheromatous irregularity. Classic 3 vessel brachiocephalic arterial origin  anatomy without proximal stenosis. There is fairly good contrast opacification of the pulmonary arterial tree; exam was not optimized for detection of pulmonary emboli.  Previous median sternotomy. No pleural or pericardial effusion. Patchy coronary calcifications. No hilar or mediastinal adenopathy. Lungs are clear. Stable cyst In the upper pole left kidney. Remainder visualized upper abdomen unremarkable. thoracic spine intact.  IMPRESSION: IMPRESSION  1. Stable 4.8 cm ascending aortic aneurysm without dissection or other complicating features   Electronically Signed   By: Oley Balm M.D.   On: 10/08/2012 14:37    Recent Lab Findings: Lab Results  Component Value Date   WBC 6.4 12/07/2015   HGB 15.7 12/07/2015   HCT 46.5 12/07/2015   PLT 219.0 12/07/2015   GLUCOSE 80 12/07/2015   CHOL 182 12/07/2015   TRIG 162.0 (H) 12/07/2015   HDL 48.60 12/07/2015   LDLCALC 101 (H) 12/07/2015   ALT 18 12/07/2015   AST 19 12/07/2015   NA 142 12/07/2015   K 4.1 12/07/2015   CL 105 12/07/2015   CREATININE 0.96 12/07/2015   BUN 12 12/07/2015   CO2 32 12/07/2015   TSH 1.57 12/07/2015   INR 3.3 12/08/2015   Aortic Size Index=  4.8     /Body surface area is 1.89 meters squared. = 2.5  < 2.75 cm/m2      4% risk per year 2.75 to 4.25          8% risk per year > 4.25 cm/m2    20% risk per year    Assessment / Plan:   No change in 4.8  since 2011 cm ascending aortic dilatation in patient with previous mechanic aortic valve replacement. There's been no appreciable change in the size of the aorta since his scan in 2011. I discussed the diagnosis with the patient and we have decided to continue to monitor the size of his aorta and will obtain a followup CT scan in one year.  I've reviewed with him the signs and symptoms of aortic dissection, instructed to avoid heavy lifting or competitive weightlifting.  Delight OvensEdward B Emmaleah Meroney MD  Beeper [phone] 12/28/2015 12:41 PM

## 2016-01-02 ENCOUNTER — Other Ambulatory Visit: Payer: Self-pay | Admitting: Cardiology

## 2016-01-19 ENCOUNTER — Ambulatory Visit (INDEPENDENT_AMBULATORY_CARE_PROVIDER_SITE_OTHER): Payer: BLUE CROSS/BLUE SHIELD | Admitting: *Deleted

## 2016-01-19 DIAGNOSIS — Z952 Presence of prosthetic heart valve: Secondary | ICD-10-CM | POA: Diagnosis not present

## 2016-01-19 DIAGNOSIS — Z5181 Encounter for therapeutic drug level monitoring: Secondary | ICD-10-CM | POA: Diagnosis not present

## 2016-01-19 DIAGNOSIS — Z7901 Long term (current) use of anticoagulants: Secondary | ICD-10-CM

## 2016-01-19 DIAGNOSIS — I359 Nonrheumatic aortic valve disorder, unspecified: Secondary | ICD-10-CM

## 2016-01-19 LAB — POCT INR: INR: 3

## 2016-02-29 ENCOUNTER — Ambulatory Visit (INDEPENDENT_AMBULATORY_CARE_PROVIDER_SITE_OTHER): Payer: BLUE CROSS/BLUE SHIELD

## 2016-02-29 ENCOUNTER — Encounter: Payer: Self-pay | Admitting: Cardiology

## 2016-02-29 ENCOUNTER — Ambulatory Visit (INDEPENDENT_AMBULATORY_CARE_PROVIDER_SITE_OTHER): Payer: BLUE CROSS/BLUE SHIELD | Admitting: Cardiology

## 2016-02-29 VITALS — BP 124/64 | HR 66 | Ht 69.0 in | Wt 163.0 lb

## 2016-02-29 DIAGNOSIS — Z7901 Long term (current) use of anticoagulants: Secondary | ICD-10-CM

## 2016-02-29 DIAGNOSIS — Z5181 Encounter for therapeutic drug level monitoring: Secondary | ICD-10-CM

## 2016-02-29 DIAGNOSIS — Z952 Presence of prosthetic heart valve: Secondary | ICD-10-CM

## 2016-02-29 DIAGNOSIS — I359 Nonrheumatic aortic valve disorder, unspecified: Secondary | ICD-10-CM | POA: Diagnosis not present

## 2016-02-29 DIAGNOSIS — E782 Mixed hyperlipidemia: Secondary | ICD-10-CM | POA: Diagnosis not present

## 2016-02-29 LAB — POCT INR: INR: 2.4

## 2016-02-29 NOTE — Progress Notes (Signed)
Cardiology Office Note    Date:  02/29/2016   ID:  Ryan Lamb, DOB [birth year], MRN [id number]  PCP:  Tana Conch, MD  Cardiologist:   Tobias Alexander, MD , previously Dr. Patty Sermons.  Chief complaint: 6 months follow-up.  History of Present Illness:  Ryan Lamb is a 61 y.o. male with prior medical history of aortic stenosis in a trileaflet valve, status post aVR with 23 mm St. to mechanical prosthesis in 1994 by Dr. Tyrone Sage. In December of 2011 because of a chest x-ray suggestive of dilatation of his descending aorta he was seen again and has had CT scans of the chest in 2011, 2012,2013, 2014 , 2015, 2016 followed by Dr. Tyrone Sage, he's last maximum diameter was 49 mm that was stable since 2011. He has no family history of connective tissue or Marfan's disorder. He is to be followed by Dr. Tyrone Sage in December 2017 with repeat CTA.  The patient is being followed in our Coumadin clinic. He has been feeling well continues to walk 3 times a week he does some pushups and overall feels well with mild worsening of his stamina during exercise in the last couple years. Denies any chest pain no shortness of breath other than on moderate exertion, no lower extremity edema, claudications, palpitations or syncope no orthopnea or proximal nocturnal dyspnea. He has been having problems with his chronic right ear infection for which he sees an ENT physician. His labs are being followed by his primary care physician.  02/29/2016 - this is a 6 months follow-up, the patient states that he feels great, he continues to exercise and has no chest pain or shortness of breath, he also denies any palpitations, claudication or lower extremity edema or dizziness. He has been compliant with Coumadin and has no signs of bleeding. He follows in our coumadin clinic, his today's INR was 2.4.  Past Medical History:  Diagnosis Date  . Aortic stenosis   . Aortic valve prosthesis present   .  Dissecting aortic aneurysm (any part), thoracic (HCC)   . Diverticulosis   . Gastric ulcer    due to nsaids in past  . Hypercholesterolemia   . Mechanical heart valve present     Past Surgical History:  Procedure Laterality Date  . CARDIAC VALVE REPLACEMENT    . EEC     with propofol   . ESOPHAGOGASTRODUODENOSCOPY  01/2010, 04/2010  . ESOPHAGOGASTRODUODENOSCOPY     cauterize after nsaid related ulcer  . EXPLORATION POST OPERATIVE OPEN HEART  1994, 1972    Current Medications: Outpatient Medications Prior to Visit  Medication Sig Dispense Refill  . aspirin 325 MG tablet Take 325 mg by mouth as needed for mild pain or headache.     Marland Kitchen atorvastatin (LIPITOR) 20 MG tablet Take 1 tablet (20 mg total) by mouth daily. 90 tablet 3  . MULTIPLE VITAMIN PO Take 1 tablet by mouth daily.     . pantoprazole (PROTONIX) 20 MG tablet Take 20 mg by mouth daily as needed for heartburn or indigestion.    Marland Kitchen warfarin (COUMADIN) 5 MG tablet TAKE TABLETS BY MOUTH AS DIRECTED BY COUMADIN CLINIC 90 tablet 1   No facility-administered medications prior to visit.      Allergies:   Penicillins   Social History   Social History  . Marital status: Single    Spouse name: N/A  . Number of children: N/A  . Years of education: N/A   Social History Main Topics  .  Smoking status: Former Smoker    Packs/day: 0.50    Years: 5.00    Quit date: 10/17/1977  . Smokeless tobacco: Never Used  . Alcohol use No  . Drug use: No  . Sexual activity: Not Asked   Other Topics Concern  . None   Social History Narrative   Single. Lives with parents right now.       Undergrad at [employer], Masters oral [employer]- divinity. Hard to get into ministry.    Works in Airline pilotsales- Surveyor, mineralsfurniture mattress      Hobbies: golf, walking, reading books, read and study the Bible     Family History:  The patient's family history includes Coronary artery disease in his father and mother; Healthy in his brother and sister; Schizophrenia  in his sister; Skin cancer in his father.   ROS:   Please see the history of present illness.    ROS All other systems reviewed and are negative. Heart: 3/6 diastolic murmur, mechanical valve click, no rubs, or gallops,no edema  Respiratory:  clear to auscultation bilaterally, normal work of breathing GI: soft, nontender, nondistended, + BS MS: no deformity or atrophy  Skin: warm and dry, no rash Neuro:  Alert and Oriented x 3, Strength and sensation are intact Psych: euthymic mood, full affect  Wt Readings from Last 3 Encounters:  02/29/16 163 lb (73.9 kg)  12/28/15 162 lb (73.5 kg)  12/07/15 162 lb 3.2 oz (73.6 kg)    Studies/Labs Reviewed:   EKG:  EKG is ordered today. It shows normal sinus rhythm normal EKG, PVCs no longer present.  Recent Labs: 12/07/2015: ALT 18; BUN 12; Creatinine, Ser 0.96; Hemoglobin 15.7; Platelets 219.0; Potassium 4.1; Sodium 142; TSH 1.57   Lipid Panel    Component Value Date/Time   CHOL 182 12/07/2015 1055   TRIG 162.0 (H) 12/07/2015 1055   HDL 48.60 12/07/2015 1055   CHOLHDL 4 12/07/2015 1055   VLDL 32.4 12/07/2015 1055   LDLCALC 101 (H) 12/07/2015 1055   Additional studies/ records that were reviewed today include:   CTA: 12/2014 FINDINGS: Ascending thoracic aortic aneurysm again noted measuring 4.9 cm in greatest diameter compared with 4.8 cm previously. Proximal aortic arch 4.6 cm. Distal aortic arch 2.7 cm. Proximal descending thoracic aorta 2.5 cm. Heart is borderline in size. Prior aortic valve replacement. Calcifications throughout the left anterior descending coronary artery. No mediastinal, hilar, or axillary adenopathy. Chest wall soft tissues are unremarkable. Imaging into the upper abdomen shows no acute findings. Stable 4 mm nodule in the medial lingula on image 29. Lungs are otherwise clear. No focal airspace opacities or suspicious nodules. No effusions. Imaging into the upper abdomen shows no acute findings. No acute  bony abnormality. Review of the MIP images confirms the above findings.  IMPRESSION: Stable ascending thoracic aortic aneurysm, 4.9 cm maximally compared to 4.8 cm previously   TTE: 08/2015 - Left ventricle: The cavity size was normal. Wall thickness was   normal. Systolic function was normal. The estimated ejection   fraction was in the range of 55% to 60%. Features are consistent   with a pseudonormal left ventricular filling pattern, with   concomitant abnormal relaxation and increased filling pressure   (grade 2 diastolic dysfunction). - Aortic valve: Mechanical aortic valve. No significant prosthetic   valve stenosis. There was no significant regurgitation. Mean   gradient (S): 12 mm Hg. - Aorta: Mildly dilated aortic root. Aortic root dimension: 37 mm   (ED). - Right ventricle: The cavity size  was normal. Systolic function   was normal. - Tricuspid valve: Peak RV-RA gradient (S): 20 mm Hg. - Pulmonary arteries: PA peak pressure: 23 mm Hg (S). - Inferior vena cava: The vessel was normal in size. The   respirophasic diameter changes were in the normal range (= 50%),   consistent with normal central venous pressure.  Impressions: - Normal LV size with EF 55-60%. Moderate diastolic dysfunction.   Normal RV size and systolic function. Mechanical aortic valve   appears to function normally.   ASSESSMENT:    1. Heart valve replaced   2. S/P AVR   3. Encounter for therapeutic drug monitoring   4. Mixed hyperlipidemia   5. Warfarin anticoagulation     PLAN:  In order of problems listed above:  1. Status post AVR, normal transaortic gradients on echocardiogram in August 2017. He is compliant with Coumadin and has no bleeding. INR normal today.  2. Ascending aortic aneurysm, followed by Dr. Tyrone Sage, CTA in December 2017 showed stable size of 4.8 cm.  3. Hypertension - well controlled on current regimen.  4. Hyperlipidemia - elevated triglycerides advised to cut out  sugar intake.  Medication Adjustments/Labs and Tests Ordered: Current medicines are reviewed at length with the patient today.  Concerns regarding medicines are outlined above.  Medication changes, Labs and Tests ordered today are listed in the Patient Instructions below. Patient Instructions  Medication Instructions:   Your physician recommends that you continue on your current medications as directed. Please refer to the Current Medication list given to you today.     Follow-Up:  Your physician wants you to follow-up in: ONE YEAR WITH DR Johnell Comings will receive a reminder letter in the mail two months in advance. If you don't receive a letter, please call our office to schedule the follow-up appointment.        If you need a refill on your cardiac medications before your next appointment, please call your pharmacy.      Signed, Tobias Alexander, MD  02/29/2016 9:08 AM    [facility] [address] [place], [place], [place]  [address] Phone: [phone]; Fax: [phone]

## 2016-02-29 NOTE — Patient Instructions (Signed)

## 2016-04-11 ENCOUNTER — Ambulatory Visit (INDEPENDENT_AMBULATORY_CARE_PROVIDER_SITE_OTHER): Payer: BLUE CROSS/BLUE SHIELD | Admitting: *Deleted

## 2016-04-11 DIAGNOSIS — Z7901 Long term (current) use of anticoagulants: Secondary | ICD-10-CM | POA: Diagnosis not present

## 2016-04-11 DIAGNOSIS — Z5181 Encounter for therapeutic drug level monitoring: Secondary | ICD-10-CM | POA: Diagnosis not present

## 2016-04-11 DIAGNOSIS — I359 Nonrheumatic aortic valve disorder, unspecified: Secondary | ICD-10-CM | POA: Diagnosis not present

## 2016-04-11 DIAGNOSIS — Z952 Presence of prosthetic heart valve: Secondary | ICD-10-CM | POA: Diagnosis not present

## 2016-04-11 LAB — POCT INR: INR: 2.5

## 2016-05-15 DIAGNOSIS — J301 Allergic rhinitis due to pollen: Secondary | ICD-10-CM | POA: Diagnosis not present

## 2016-05-15 DIAGNOSIS — H9311 Tinnitus, right ear: Secondary | ICD-10-CM | POA: Diagnosis not present

## 2016-05-15 DIAGNOSIS — H6521 Chronic serous otitis media, right ear: Secondary | ICD-10-CM | POA: Diagnosis not present

## 2016-05-23 ENCOUNTER — Ambulatory Visit (INDEPENDENT_AMBULATORY_CARE_PROVIDER_SITE_OTHER): Payer: BLUE CROSS/BLUE SHIELD | Admitting: *Deleted

## 2016-05-23 DIAGNOSIS — I359 Nonrheumatic aortic valve disorder, unspecified: Secondary | ICD-10-CM | POA: Diagnosis not present

## 2016-05-23 DIAGNOSIS — Z952 Presence of prosthetic heart valve: Secondary | ICD-10-CM

## 2016-05-23 DIAGNOSIS — Z5181 Encounter for therapeutic drug level monitoring: Secondary | ICD-10-CM

## 2016-05-23 DIAGNOSIS — Z7901 Long term (current) use of anticoagulants: Secondary | ICD-10-CM

## 2016-05-23 LAB — POCT INR: INR: 2.7

## 2016-06-17 ENCOUNTER — Other Ambulatory Visit: Payer: Self-pay | Admitting: Cardiology

## 2016-10-24 ENCOUNTER — Ambulatory Visit (INDEPENDENT_AMBULATORY_CARE_PROVIDER_SITE_OTHER): Payer: BLUE CROSS/BLUE SHIELD | Admitting: *Deleted

## 2016-10-24 DIAGNOSIS — Z7901 Long term (current) use of anticoagulants: Secondary | ICD-10-CM | POA: Diagnosis not present

## 2016-10-24 DIAGNOSIS — Z5181 Encounter for therapeutic drug level monitoring: Secondary | ICD-10-CM | POA: Diagnosis not present

## 2016-10-24 DIAGNOSIS — Z952 Presence of prosthetic heart valve: Secondary | ICD-10-CM | POA: Diagnosis not present

## 2016-10-24 DIAGNOSIS — I359 Nonrheumatic aortic valve disorder, unspecified: Secondary | ICD-10-CM | POA: Diagnosis not present

## 2016-10-24 LAB — POCT INR: INR: 3.9

## 2016-11-21 ENCOUNTER — Ambulatory Visit (INDEPENDENT_AMBULATORY_CARE_PROVIDER_SITE_OTHER): Payer: BLUE CROSS/BLUE SHIELD | Admitting: *Deleted

## 2016-11-21 DIAGNOSIS — Z952 Presence of prosthetic heart valve: Secondary | ICD-10-CM

## 2016-11-21 DIAGNOSIS — Z7901 Long term (current) use of anticoagulants: Secondary | ICD-10-CM

## 2016-11-21 DIAGNOSIS — Z5181 Encounter for therapeutic drug level monitoring: Secondary | ICD-10-CM | POA: Diagnosis not present

## 2016-11-21 DIAGNOSIS — I359 Nonrheumatic aortic valve disorder, unspecified: Secondary | ICD-10-CM | POA: Diagnosis not present

## 2016-11-21 LAB — POCT INR: INR: 4.1

## 2016-11-21 NOTE — Patient Instructions (Signed)
Do not take any Coumadin today Nov 15th then change coumadin dose to 1/2 tablet (2.5mg )  daily except 1 tablet (5mg ) on Mondays Wednesdays and Fridays  Recheck INR in 2 weeks.

## 2016-11-27 ENCOUNTER — Other Ambulatory Visit: Payer: Self-pay | Admitting: *Deleted

## 2016-11-27 DIAGNOSIS — I712 Thoracic aortic aneurysm, without rupture, unspecified: Secondary | ICD-10-CM

## 2016-12-05 ENCOUNTER — Ambulatory Visit (INDEPENDENT_AMBULATORY_CARE_PROVIDER_SITE_OTHER): Payer: BLUE CROSS/BLUE SHIELD | Admitting: Pharmacist

## 2016-12-05 ENCOUNTER — Other Ambulatory Visit: Payer: BLUE CROSS/BLUE SHIELD

## 2016-12-05 DIAGNOSIS — Z5181 Encounter for therapeutic drug level monitoring: Secondary | ICD-10-CM

## 2016-12-05 DIAGNOSIS — I359 Nonrheumatic aortic valve disorder, unspecified: Secondary | ICD-10-CM | POA: Diagnosis not present

## 2016-12-05 DIAGNOSIS — Z7901 Long term (current) use of anticoagulants: Secondary | ICD-10-CM | POA: Diagnosis not present

## 2016-12-05 DIAGNOSIS — Z952 Presence of prosthetic heart valve: Secondary | ICD-10-CM | POA: Diagnosis not present

## 2016-12-05 LAB — POCT INR: INR: 3

## 2016-12-05 NOTE — Patient Instructions (Signed)
Continue dose of 1/2 tablet (2.5mg ) daily except 1 tablet (5mg ) on Mondays, Wednesdays, and Fridays  Recheck INR in 3 weeks.

## 2016-12-09 ENCOUNTER — Encounter: Payer: BLUE CROSS/BLUE SHIELD | Admitting: Family Medicine

## 2016-12-12 ENCOUNTER — Encounter: Payer: Self-pay | Admitting: Family Medicine

## 2016-12-12 ENCOUNTER — Ambulatory Visit (INDEPENDENT_AMBULATORY_CARE_PROVIDER_SITE_OTHER): Payer: BLUE CROSS/BLUE SHIELD | Admitting: Family Medicine

## 2016-12-12 VITALS — BP 118/82 | HR 71 | Temp 97.8°F | Ht 69.0 in | Wt 158.6 lb

## 2016-12-12 DIAGNOSIS — Z114 Encounter for screening for human immunodeficiency virus [HIV]: Secondary | ICD-10-CM

## 2016-12-12 DIAGNOSIS — Z952 Presence of prosthetic heart valve: Secondary | ICD-10-CM

## 2016-12-12 DIAGNOSIS — I7121 Aneurysm of the ascending aorta, without rupture: Secondary | ICD-10-CM

## 2016-12-12 DIAGNOSIS — Z125 Encounter for screening for malignant neoplasm of prostate: Secondary | ICD-10-CM

## 2016-12-12 DIAGNOSIS — Z23 Encounter for immunization: Secondary | ICD-10-CM

## 2016-12-12 DIAGNOSIS — I712 Thoracic aortic aneurysm, without rupture: Secondary | ICD-10-CM | POA: Diagnosis not present

## 2016-12-12 DIAGNOSIS — Z0000 Encounter for general adult medical examination without abnormal findings: Secondary | ICD-10-CM

## 2016-12-12 DIAGNOSIS — E7800 Pure hypercholesterolemia, unspecified: Secondary | ICD-10-CM | POA: Diagnosis not present

## 2016-12-12 LAB — COMPREHENSIVE METABOLIC PANEL
ALBUMIN: 4.6 g/dL (ref 3.5–5.2)
ALK PHOS: 69 U/L (ref 39–117)
ALT: 21 U/L (ref 0–53)
AST: 21 U/L (ref 0–37)
BUN: 12 mg/dL (ref 6–23)
CO2: 30 mEq/L (ref 19–32)
CREATININE: 0.95 mg/dL (ref 0.40–1.50)
Calcium: 9.1 mg/dL (ref 8.4–10.5)
Chloride: 104 mEq/L (ref 96–112)
GFR: 85.89 mL/min (ref >60.00)
GLUCOSE: 89 mg/dL (ref 70–99)
Potassium: 4 mEq/L (ref 3.5–5.1)
SODIUM: 140 meq/L (ref 135–145)
TOTAL PROTEIN: 6.5 g/dL (ref 6.0–8.3)
Total Bilirubin: 0.9 mg/dL (ref 0.2–1.2)

## 2016-12-12 LAB — CBC
HCT: 46.9 % (ref 39.0–52.0)
Hemoglobin: 15.8 g/dL (ref 13.0–17.0)
MCHC: 33.7 g/dL (ref 30.0–36.0)
MCV: 91 fl (ref 78.0–100.0)
Platelets: 215 10*3/uL (ref 150.0–400.0)
RBC: 5.15 Mil/uL (ref 4.22–5.81)
RDW: 13.1 % (ref 11.5–15.5)
WBC: 5.5 10*3/uL (ref 4.0–10.5)

## 2016-12-12 LAB — LIPID PANEL
CHOLESTEROL: 155 mg/dL (ref 0–200)
HDL: 50.9 mg/dL (ref >39.00)
LDL Cholesterol: 82 mg/dL (ref 0–99)
NONHDL: 104
Total CHOL/HDL Ratio: 3
Triglycerides: 112 mg/dL (ref 0.0–149.0)
VLDL: 22.4 mg/dL (ref 0.0–40.0)

## 2016-12-12 LAB — PSA: PSA: 1.45 ng/mL (ref 0.10–4.00)

## 2016-12-12 LAB — TSH: TSH: 1.37 u[IU]/mL (ref 0.35–4.50)

## 2016-12-12 NOTE — Progress Notes (Signed)
Phone: [phone]  Subjective:  Patient presents today for their annual physical. Chief complaint-noted.   See problem oriented charting- ROS- full  review of systems was completed and negative except for: hearing loss, cough, inner ear issues- sees ENT  The following were reviewed and entered/updated in epic: Past Medical History:  Diagnosis Date  . Aortic stenosis   . Aortic valve prosthesis present   . Dissecting aortic aneurysm (any part), thoracic (HCC)   . Diverticulosis   . Gastric ulcer    due to nsaids in past  . Hypercholesterolemia   . Mechanical heart valve present    Patient Active Problem List   Diagnosis Date Noted  . Ascending aortic aneurysm (HCC) 10/18/2010    Priority: High  . Hx of mechanical aortic valve replacement     Priority: High  . GERD (gastroesophageal reflux disease) 04/22/2011    Priority: Medium  . Hypercholesterolemia     Priority: Medium  . Long term (current) use of anticoagulants 05/08/2010    Priority: Low  . History of adenomatous polyp of colon 12/11/2015  . Encounter for therapeutic drug monitoring 12/08/2015   Past Surgical History:  Procedure Laterality Date  . CARDIAC VALVE REPLACEMENT    . EEC     with propofol   . ESOPHAGOGASTRODUODENOSCOPY  01/2010, 04/2010  . ESOPHAGOGASTRODUODENOSCOPY     cauterize after nsaid related ulcer  . EXPLORATION POST OPERATIVE OPEN HEART  1994, 1972    Family History  Problem Relation Age of Onset  . Coronary artery disease Mother        CABG  . Coronary artery disease Father        aortic valve replacement  . Skin cancer Father   . Healthy Sister   . Healthy Brother        overweight  . Schizophrenia Sister   . GI problems Neg Hx     Medications- reviewed and updated Current Outpatient Medications  Medication Sig Dispense Refill  . aspirin 325 MG tablet Take 325 mg by mouth as needed for mild pain or headache.     Marland Kitchen. atorvastatin (LIPITOR) 20 MG tablet Take 1 tablet (20 mg  total) by mouth daily. 90 tablet 3  . MULTIPLE VITAMIN PO Take 1 tablet by mouth daily.     . pantoprazole (PROTONIX) 20 MG tablet Take 20 mg by mouth daily as needed for heartburn or indigestion.    Marland Kitchen. warfarin (COUMADIN) 5 MG tablet TAKE TABLETS BY MOUTH AS DIRECTED AT COUMADIN CLINIC 90 tablet 1   No current facility-administered medications for this visit.     Allergies-reviewed and updated Allergies  Allergen Reactions  . Penicillins Rash    Reaction unknown may have been a rash    Social History   Socioeconomic History  . Marital status: Single    Spouse name: None  . Number of children: None  . Years of education: None  . Highest education level: None  Social Needs  . Financial resource strain: None  . Food insecurity - worry: None  . Food insecurity - inability: None  . Transportation needs - medical: None  . Transportation needs - non-medical: None  Occupational History  . None  Tobacco Use  . Smoking status: Former Smoker    Packs/day: 0.50    Years: 5.00    Pack years: 2.50    Last attempt to quit: 10/17/1977    Years since quitting: 39.1  . Smokeless tobacco: Never Used  Substance and Sexual Activity  .  Alcohol use: No  . Drug use: No  . Sexual activity: None  Other Topics Concern  . None  Social History Narrative   Single. Lives with parents right now.       Undergrad at [employer], Masters oral [employer]- divinity. Hard to get into ministry.    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 Oropharynx normal Neck: no thyromegaly CV: RRR no murmurs rubs or gallops Lungs: CTAB no  crackles, wheeze, rhonchi Abdomen: soft/nontender/nondistended/normal bowel sounds. No rebound or guarding. Healthy weight Ext: no edema Skin: warm, dry Neuro: grossly normal, moves all extremities, PERRLA Rectal: normal tone, normal sized prostate, no masses or tenderness  Assessment/Plan:  61 y.o. male presenting for annual physical.  Health Maintenance counseling: 1. Anticipatory guidance: Patient counseled regarding regular dental exams q6 months, eye exams -sees next week Dr. Nile RiggsShapiro, wearing seatbelts.  2. Risk factor reduction:  Advised patient of need for regular exercise and diet rich and fruits and vegetables to reduce risk of heart attack and stroke. Exercise- 4-5 days a week 50-100 push ups, walking, abs. Diet-. Patient has lost the 5 lbs he discussed losing last year, wants to lose 5 more- he is pleased with this. attributes this to cutting down on portion sizes Wt Readings from Last 3 Encounters:  12/12/16 158 lb 9.6 oz (71.9 kg)  02/29/16 163 lb (73.9 kg)  12/28/15 162 lb (73.5 kg)  3. Immunizations/screenings/ancillary studies- discussed shingrix availability issues - has had shingles in the past. Discussed HIV screening- didn't get added last year as intended Immunization History  Administered Date(s) Administered  . Influenza,inj,Quad PF,6+ Mos 11/21/2014, 08/10/2015, 12/12/2016  . Tdap 11/21/2014  4. Prostate cancer screening- Update PSA and rectal exam Lab Results  Component Value Date   PSA 0.87 12/07/2015   5. Colon cancer screening - 08/2015 with 5 year repeat due to adenoma history  6. Skin cancer screening- no regular dermatology visits. advised regular sunscreen use. Denies worrisome, changing, or new skin lesions.   Status of chronic or acute concerns   Sees Dr. Brion Alimentrossly for ENT issues- hearing loss and inner ear issues  Intermittent Cough- seems to be from post nasal drip. Doesn't bother him enough to want to take anything  Ascending aortic aneurysm  [facility](HCC) Aortic aneurysm- follows with cardiothoracic surgery in January. Last CTA dec 2017 stable- likely to be repeated early next year at visit  Hx of mechanical aortic valve replacement S/p aortic valve replacement- he remains on coumadin for St. Jude mechanical valve. Follows with Dr. Delton SeeNelson   Hypercholesterolemia Hyperlipidemia- LDL above 100 last check so increased atorvastatin to 20mg  from 10mg  Mom with thyroid issues- check TSH also given HLD  Future Appointments  Date Time Provider Department Center  12/26/2016  8:00 AM [facility]  01/09/2017  2:00 PM [facility] 1 [facility]  01/09/2017  3:00 PM Delight OvensGerhardt, Edward B, MD [facility]   1 year CPE  Orders Placed This Encounter  Procedures  . Flu Vaccine QUAD 36+ mos IM  Return precautions advised.  Garret Reddish, MD

## 2016-12-12 NOTE — Assessment & Plan Note (Signed)
Hyperlipidemia- LDL above 100 last check so increased atorvastatin to 20mg  from 10mg  Mom with thyroid issues- check TSH also given HLD

## 2016-12-12 NOTE — Assessment & Plan Note (Signed)
Aortic aneurysm- follows with cardiothoracic surgery in January. Last CTA dec 2017 stable- likely to be repeated early next year at visit

## 2016-12-12 NOTE — Patient Instructions (Signed)
Please stop by lab before you go  No changes today. Glad you are doing well! Hopefully bad cholesterol under 100 on this check

## 2016-12-12 NOTE — Assessment & Plan Note (Signed)
S/p aortic valve replacement- he remains on coumadin for St. Jude mechanical valve. Follows with Dr. Delton SeeNelson

## 2016-12-13 LAB — HIV ANTIBODY (ROUTINE TESTING W REFLEX): HIV 1&2 Ab, 4th Generation: NONREACTIVE

## 2016-12-18 ENCOUNTER — Other Ambulatory Visit: Payer: Self-pay

## 2016-12-18 DIAGNOSIS — R972 Elevated prostate specific antigen [PSA]: Secondary | ICD-10-CM

## 2016-12-23 DIAGNOSIS — H2513 Age-related nuclear cataract, bilateral: Secondary | ICD-10-CM | POA: Diagnosis not present

## 2017-01-02 ENCOUNTER — Telehealth: Payer: Self-pay | Admitting: Family Medicine

## 2017-01-02 NOTE — Telephone Encounter (Signed)
Lab  Results   Given   Plan of  Care discussed .

## 2017-01-02 NOTE — Telephone Encounter (Signed)
See note

## 2017-01-03 ENCOUNTER — Other Ambulatory Visit: Payer: Self-pay

## 2017-01-03 ENCOUNTER — Ambulatory Visit (INDEPENDENT_AMBULATORY_CARE_PROVIDER_SITE_OTHER): Payer: BLUE CROSS/BLUE SHIELD | Admitting: *Deleted

## 2017-01-03 DIAGNOSIS — Z7901 Long term (current) use of anticoagulants: Secondary | ICD-10-CM

## 2017-01-03 DIAGNOSIS — Z952 Presence of prosthetic heart valve: Secondary | ICD-10-CM

## 2017-01-03 DIAGNOSIS — I359 Nonrheumatic aortic valve disorder, unspecified: Secondary | ICD-10-CM | POA: Diagnosis not present

## 2017-01-03 DIAGNOSIS — Z5181 Encounter for therapeutic drug level monitoring: Secondary | ICD-10-CM | POA: Diagnosis not present

## 2017-01-03 LAB — POCT INR: INR: 2.6

## 2017-01-03 MED ORDER — ATORVASTATIN CALCIUM 20 MG PO TABS: 20.0000 mg | ORAL_TABLET | Freq: Every day | ORAL | 3 refills | Status: DC

## 2017-01-03 NOTE — Patient Instructions (Addendum)
Description   Continue dose of 1/2 tablet (2.5mg ) daily except 1 tablet (5mg ) on Mondays, Wednesdays, and Fridays  Recheck INR in 4 weeks. Call us with any medications Changes or concerns # [phone]

## 2017-01-09 ENCOUNTER — Other Ambulatory Visit: Payer: BLUE CROSS/BLUE SHIELD

## 2017-01-09 ENCOUNTER — Encounter: Payer: BLUE CROSS/BLUE SHIELD | Admitting: Cardiothoracic Surgery

## 2017-01-31 ENCOUNTER — Ambulatory Visit (INDEPENDENT_AMBULATORY_CARE_PROVIDER_SITE_OTHER): Payer: BLUE CROSS/BLUE SHIELD

## 2017-01-31 DIAGNOSIS — Z952 Presence of prosthetic heart valve: Secondary | ICD-10-CM | POA: Diagnosis not present

## 2017-01-31 DIAGNOSIS — Z7901 Long term (current) use of anticoagulants: Secondary | ICD-10-CM

## 2017-01-31 DIAGNOSIS — Z5181 Encounter for therapeutic drug level monitoring: Secondary | ICD-10-CM | POA: Diagnosis not present

## 2017-01-31 DIAGNOSIS — I359 Nonrheumatic aortic valve disorder, unspecified: Secondary | ICD-10-CM | POA: Diagnosis not present

## 2017-01-31 LAB — POCT INR: INR: 2.7

## 2017-01-31 NOTE — Patient Instructions (Signed)
Description   Continue on same dosage 1/2 tablet (2.5mg) daily except 1 tablet (5mg) on Mondays, Wednesdays, and Fridays  Recheck INR in 6 weeks. Call us with any medications Changes or concerns # [phone]     

## 2017-02-17 ENCOUNTER — Encounter: Payer: Self-pay | Admitting: Family Medicine

## 2017-02-17 ENCOUNTER — Other Ambulatory Visit (INDEPENDENT_AMBULATORY_CARE_PROVIDER_SITE_OTHER): Payer: BLUE CROSS/BLUE SHIELD

## 2017-02-17 DIAGNOSIS — R972 Elevated prostate specific antigen [PSA]: Secondary | ICD-10-CM | POA: Diagnosis not present

## 2017-02-17 LAB — PSA: PSA: 1.26 ng/mL (ref 0.10–4.00)

## 2017-02-18 ENCOUNTER — Encounter: Payer: Self-pay | Admitting: Family Medicine

## 2017-03-06 ENCOUNTER — Ambulatory Visit: Payer: BLUE CROSS/BLUE SHIELD | Admitting: Cardiothoracic Surgery

## 2017-03-06 ENCOUNTER — Ambulatory Visit
Admission: RE | Admit: 2017-03-06 | Discharge: 2017-03-06 | Disposition: A | Discharge status: Home / Self Care | Payer: BLUE CROSS/BLUE SHIELD | Source: Ambulatory Visit | Attending: Cardiothoracic Surgery | Admitting: Cardiothoracic Surgery

## 2017-03-06 VITALS — BP 108/74 | HR 87 | Resp 20 | Ht 69.0 in | Wt 160.0 lb

## 2017-03-06 DIAGNOSIS — I712 Thoracic aortic aneurysm, without rupture, unspecified: Secondary | ICD-10-CM

## 2017-03-06 DIAGNOSIS — I7121 Aneurysm of the ascending aorta, without rupture: Secondary | ICD-10-CM

## 2017-03-06 MED ORDER — IOPAMIDOL (ISOVUE-370) INJECTION 76%
75.0000 mL | Freq: Once | INTRAVENOUS | Status: AC | PRN
  Administered 2017-03-06: 75 mL via INTRAVENOUS

## 2017-03-06 NOTE — Patient Instructions (Signed)

## 2017-03-06 NOTE — Progress Notes (Signed)
[address]       [place] [address]             [phone]         [facility] Medical Record #[id number] Date of Birth: 1955/05/15  Referring: Cassell Clement, MD Primary Care: Shelva Majestic, MD Primary cardiologist: Dr Delton See  Chief Complaint:    Chief Complaint  Patient presents with  . Thoracic Aortic Aneurysm    1 Year f/u with CTA Chest    History of Present Illness:    Patient returns today with a followup CTA of the chest. In 1971 the patient had aortic valvuloplasty. Subsequently in 1994 I replaced his aortic valve with a #23 [employer] mechanical prosthesis. At the time of surgery   4/20 1994 He was noted to have a tri leaflet aortic valve.  In December of 2011 because of a chest x-ray suggestive of dilatation of his descending aorta he was seen again and has had CT scans of the chest in 2011, 2012,2013, 2014 , 2015,2017   and today. He has no family history of connective tissue or Marfan's disorder. He remains asymptomatic. He notes he follows his blood pressure carefully and has not been hypertensive. He continues to follow endocarditis precautions especially with dental work. He had echocardiogram and nuclear stress test done in August 2017.  He has been he has been diligent about Coumadin follow-up and had very little problems with long-term Coumadin use.  Current Activity/ Functional Status: Patient remains active without any physical limitations    Past Medical History  Diagnosis Date  . Aortic stenosis as infant, with recurrent as and valve replacement   . Aortic valve prosthesis present   . Hypercholesterolemia     Past Surgical History  Procedure Date  . Cardiac valve replacement- mechanical aortic valve  1994     There is no family history of Marfan's or other connective tissue disorder or sudden death from dissection .  History   Social History  . Marital Status: Single   Spouse Name: N/A    Number of Children: N/A  . Years of Education: N/A   Occupational History  . Not on file.   Social History Main Topics  . Smoking status: Former Smoker -- 0.5 packs/day for 5 years    Quit date: 10/17/1977  . Smokeless tobacco: Never Used  . Alcohol Use: No  . Drug Use: No              Social History Narrative  . No narrative on file    Social History   Tobacco Use  Smoking Status Former Smoker  . Packs/day: 0.50  . Years: 5.00  . Pack years: 2.50  . Last attempt to quit: 10/17/1977  . Years since quitting: 39.4  Smokeless Tobacco Never Used    Social History   Substance and Sexual Activity  Alcohol Use No     Allergies  Allergen Reactions  . Penicillins Rash    Reaction unknown may have been a rash    Current Outpatient Medications  Medication Sig Dispense Refill  . aspirin 325 MG tablet Take 325 mg by mouth as needed for mild pain or headache.     Marland Kitchen atorvastatin (LIPITOR) 20 MG tablet Take 1 tablet (20 mg total) by mouth daily. 90 tablet 3  . MULTIPLE VITAMIN  PO Take 1 tablet by mouth daily.     . pantoprazole (PROTONIX) 20 MG tablet Take 20 mg by mouth daily as needed for heartburn or indigestion.    Marland Kitchen warfarin (COUMADIN) 5 MG tablet TAKE TABLETS BY MOUTH AS DIRECTED AT COUMADIN CLINIC 90 tablet 1   No current facility-administered medications for this visit.        Review of Systems:  Review of Systems  Constitutional: Negative.   HENT: Negative.   Eyes: Negative.   Respiratory: Negative.   Cardiovascular: Negative.   Gastrointestinal: Negative.   Skin: Negative.   Neurological: Negative.   Endo/Heme/Allergies: Negative for environmental allergies and polydipsia. Bruises/bleeds easily.  Psychiatric/Behavioral: Negative.    Physical Exam: BP 108/74   Pulse 87   Resp 20   Ht 5\' 9"  (1.753 m)   Wt 160 lb (72.6 kg)   SpO2 95% Comment: RA  BMI 23.63 kg/m  General appearance: alert, cooperative, appears stated age and  no distress Head: Normocephalic, without obvious abnormality, atraumatic Neck: no adenopathy, no carotid bruit, no JVD, supple, symmetrical, trachea midline and thyroid not enlarged, symmetric, no tenderness/mass/nodules Lymph nodes: Cervical, supraclavicular, and axillary nodes normal. Resp: clear to auscultation bilaterally Back: symmetric, no curvature. ROM normal. No CVA tenderness. Cardio: No murmur of aortic insufficiency crisp valve sounds of mechanical valve GI: soft, non-tender; bowel sounds normal; no masses,  no organomegaly Extremities: extremities normal, atraumatic, no cyanosis or edema and Homans sign is negative, no sign of DVT Neurologic: Grossly normal  Diagnostic Studies & Laboratory data:     Recent Radiology Findings:  Ct Angio Chest Aorta W/cm &/or Wo/cm  Result Date: 03/06/2017 CLINICAL DATA:  History of aortic valve replacement and dilatation of the ascending thoracic aorta. EXAM: CT ANGIOGRAPHY CHEST WITH CONTRAST TECHNIQUE: Multidetector CT imaging of the chest was performed using the standard protocol during bolus administration of intravenous contrast. Multiplanar CT image reconstructions and MIPs were obtained to evaluate the vascular anatomy. CONTRAST:  75mL ISOVUE-370 IOPAMIDOL (ISOVUE-370) INJECTION 76% Creatinine was obtained on site at [facility] at [address] Results: BUN 12, creatinine 0.9 mg/dL and estimated GFR 93 mL/minute COMPARISON:  12/28/2015 as well as additional annual CTA studies dating back to 2011. FINDINGS: Cardiovascular: Stable dilatation of the ascending thoracic aorta which measures approximately 4.8 cm in maximum diameter. The aortic root at the level of the sinuses of Valsalva measures approximately 3.7 cm. The proximal arch measures 3.7 cm. The distal arch measures 2.8 cm. The descending thoracic aorta measures 2.3 cm. Proximal great vessels show stable and normal patency. The heart size is stable. There is a stable small  aneurysm at the left ventricular apex measuring roughly 8 x 9 mm. No pericardial fluid identified. Stable coronary atherosclerosis with calcified plaque in the distribution of the LAD. CT appearance of the prosthetic aortic valve is unremarkable. Central pulmonary arteries are normal in caliber. Mediastinum/Nodes: No enlarged mediastinal, hilar, or axillary lymph nodes. Thyroid gland, trachea, and esophagus demonstrate no significant findings. Lungs/Pleura: Well-circumscribed noncalcified pulmonary nodule in the anterior and medial aspect of the lingula again noted which currently measures approximately 5 x 6 mm. This nodule has very slowly enlarged over time, measuring approximately 3 mm in 2011 and 2012, 4 mm in 2013, 5 mm in 2015 and 5 mm in 2017. No other pulmonary nodules are identified. There is no evidence of pulmonary edema, consolidation, pneumothorax or pleural fluid. Upper Abdomen: No acute abnormality. Musculoskeletal: No chest wall abnormality. No acute or significant  osseous findings. Review of the MIP images confirms the above findings. IMPRESSION: 1. Stable dilatation of the ascending thoracic aorta which measures approximately 4.8 cm in maximum diameter. 2. Very slowly enlarging solitary lingular lung nodule which measures 6 mm in greatest diameter currently and measured 3 mm in 2011 and 2012. This continues to warrant follow-up. The nodule is likely still too small to be accurately assessed by PET scan. 3. 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 4. Stable coronary atherosclerosis with calcified plaque in the distribution of the LAD. Aortic aneurysm NOS (ICD10-I71.9). Electronically Signed   By: Irish LackGlenn  Yamagata M.D.   On: 03/06/2017 11:46     Ct Angio Chest Aorta W &/or Wo Contrast  Result Date: 12/28/2015 CLINICAL DATA:  Ascending aortic aneurysm.  Follow-up. EXAM: CT ANGIOGRAPHY CHEST WITH CONTRAST TECHNIQUE: Multidetector CT imaging  of the chest was performed using the standard protocol during bolus administration of intravenous contrast. Multiplanar CT image reconstructions and MIPs were obtained to evaluate the vascular anatomy. CONTRAST:  75 mL Isovue 370 COMPARISON:  12/29/2014 FINDINGS: Cardiovascular: Unchanged ascending aortic aneurysm with maximal diameter of 4.8 cm. The aorta measures 4.6 cm at the sinuses of Valsalva and 3.2 cm at the sinotubular junction, the proximal transverse aorta measures 3.8 cm, distal transverse aorta measures 2.6 cm, proximal descending thoracic aorta measures 2.4 cm, and distal descending thoracic aorta measures 2.0 cm just above the hiatus, all not significantly changed when measured at a comparable location on the prior study. Prosthetic aortic valve. No central pulmonary embolus on this nondedicated study. LAD and left circumflex coronary artery calcification. No pericardial effusion. Mediastinum/Nodes: No enlarged axillary, mediastinal, or hilar lymph nodes. Unremarkable thyroid, trachea, and esophagus. Lungs/Pleura: No pleural effusion or pneumothorax. 5 mm medial left upper lobe nodule has not significantly changed in size allowing for differences in slice selection (series 5, image 58). No new nodules. Minimal right middle lobe scarring. Upper Abdomen: Unchanged 2.3 cm left upper pole renal cyst. Musculoskeletal: Bilateral gynecomastia. No acute osseous abnormality or suspicious osseous lesion. Review of the MIP images confirms the above findings. IMPRESSION: Unchanged 4.8 cm ascending aortic aneurysm. Electronically Signed   By: Sebastian AcheAllen  Grady M.D.   On: 12/28/2015 12:20   Ct Temporal Bones Wo Contrast  Result Date: 12/18/2015 CLINICAL DATA:  RIGHT greater than LEFT chronic tinnitus, worsening for 4 months. History of multiple treated ear infections. EXAM: CT TEMPORAL BONES WITHOUT CONTRAST TECHNIQUE: Axial and coronal plane CT imaging of the petrous temporal bones was performed with  thin-collimation image reconstruction. No intravenous contrast was administered. Multiplanar CT image reconstructions were also generated. COMPARISON:  None. FINDINGS: RIGHT: External auditory canal is well formed and well aerated. Tympanic membrane is not thickened or retracted. The scutum is sharp. Well aerated middle ear including Prussak's space. Ossicles are well formed and located. Patent aditus ad antrum. Well aerated mastoid air cells without coalescence. Intact tegmen tympani. Intact otic capsule with normal appearance of the inner ear structures. No internal auditory canal expansion. No definite cerebellar pontine angle masses. Mild RIGHT temporomandibular osteoarthrosis, pneumatized pedis apices with bony dehiscence RIGHT glenoid. LEFT: External auditory canal is well formed and aerated. Tympanic membrane is not thickened or retracted. The scutum is sharp. Well aerated middle ear including Prussak's space. Ossicles are well formed and located. Patent aditus ad antrum. Well aerated mastoid air cells without coalescence. Intact tegmen tympani. Intact otic capsule with normal appearance of the inner ear structures. No internal auditory  canal expansion. No definite cerebellar pontine angle masses. Mild calcific atherosclerosis the carotid siphons. IMPRESSION: Bony dehiscence RIGHT temporomandibular joint/glenoid fossa. Otherwise negative bilateral temporal bone CT. Electronically Signed   By: Awilda Metro M.D.   On: 12/18/2015 16:57   Ct Angio Chest Aorta W/cm &/or Wo/cm  10/28/2013   CLINICAL DATA:  Ascending thoracic aortic aneurysm, followup, personal history of cardiac valve replacement, cardiac surgery, quit smoking 30 years ago  EXAM: CT ANGIOGRAPHY CHEST WITH CONTRAST  TECHNIQUE: Multidetector CT imaging of the chest was performed using the standard protocol during bolus administration of intravenous contrast. Multiplanar CT image reconstructions and MIPs were obtained to evaluate the vascular  anatomy.  CONTRAST:  80mL OMNIPAQUE IOHEXOL 350 MG/ML SOLN  COMPARISON:  10/08/2012, 09/12/2011  FINDINGS: Replacement aortic valve noted. Aortic root is dilated to 4.4 cm transverse diameter as it was previously. Mid ascending aorta is distended to 4.8 cm, unchanged. Proximal aortic arch is 3.8 cm, not significantly different from prior study. Distal aortic arch is 2.8 cm, stable. Proximal descending aorta is 2.6 cm, stable. Distal descending aorta is 2.1 cm, stable. No evidence of dissection or stenosis. The origins of the great vessels all appear normal. No significant calcified atherosclerotic plaque identified.  There is calcification of the left anterior descending artery. There is no significant pleural or pericardial effusion. Patient is status post previous median sternotomy. No significant hilar or mediastinal adenopathy.  4 mm pulmonary nodule image number 20 in the medial lingula is stable from 09/12/2011 and therefore considered benign. Stable cyst upper pole left kidney. No acute musculoskeletal findings.  Review of the MIP images confirms the above findings.  IMPRESSION: No change in ascending aortic aneurysm to a maximal diameter of 4.8 cm.   Electronically Signed   By: Esperanza Heir M.D.   On: 10/28/2013 09:30   Ct Angio Chest Aorta W/cm &/or Wo/cm  10/08/2012   CLINICAL DATA:  Thoracic aortic aneurysm, aortic stenosis, post mechanical prosthesis placement.  EXAM: CT ANGIOGRAPHY OF CHEST  TECHNIQUE: Multidetector CT imaging of the chest was performed during bolus injection of intravenous contrast. Multiplanar CT angiographic image reconstructions including MIPs were also generated to evaluate the vascular anatomy.  CONTRAST:  OMNIPAQUE IOHEXOL 350 MG/ML SOLN  COMPARISON:  09/12/2011  FINDINGS: Stable appearance of prosthetic aortic valve. Postop change of the aortic root which is ectatic measuring 4.4 cm maximum transverse diameter. Proximal ascending 4.8 cm, distal ascending 4.7 cm,  proximal arch 3.7 cm, distal arch 2.8 cm, proximal descending 2.6 cm, and distal descending 2.1 cm just above the diaphragm. Negative for dissection or stenosis. No significant atheromatous irregularity. 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 There is fairly good contrast opacification of the pulmonary arterial tree; exam was not optimized for detection of pulmonary emboli.  Previous median sternotomy. No pleural or pericardial effusion. Patchy coronary calcifications. No hilar or mediastinal adenopathy. Lungs are clear. Stable cyst In the upper pole left kidney. Remainder visualized upper abdomen unremarkable. thoracic spine intact.  IMPRESSION: IMPRESSION  1. Stable 4.8 cm ascending aortic aneurysm without dissection or other complicating features   Electronically Signed   By: Oley Balm M.D.   On: 10/08/2012 14:37    Recent Lab Findings: Lab Results  Component Value Date   WBC 5.5 12/12/2016   HGB 15.8 12/12/2016   HCT 46.9 12/12/2016   PLT 215.0 12/12/2016   GLUCOSE 89 12/12/2016   CHOL 155 12/12/2016   TRIG 112.0 12/12/2016   HDL  50.90 12/12/2016   LDLCALC 82 12/12/2016   ALT 21 12/12/2016   AST 21 12/12/2016   NA 140 12/12/2016   K 4.0 12/12/2016   CL 104 12/12/2016   CREATININE 0.95 12/12/2016   BUN 12 12/12/2016   CO2 30 12/12/2016   TSH 1.37 12/12/2016   INR 2.7 01/31/2017   Aortic Size Index=     4.8  /Body surface area is 1.88 meters squared. =2.56  < 2.75 cm/m2      4% risk per year 2.75 to 4.25          8% risk per year > 4.25 cm/m2    20% risk per year  Aortic Cross section area/ Height ratio= 10.3  Assessment / Plan:   1/ No change in 4.8  since 2011 cm ascending aortic dilatation in patient with previous mechanic aortic valve replacement. There's been no appreciable change in the size of the aorta since his scan in 2011. I discussed the diagnosis with the patient and we have decided to continue to monitor the size  of his aorta and will obtain a followup CT scan in one year.   2/ Very slowly enlarging solitary lingular lung nodule which measures 6 mm in greatest diameter currently and measured 3 mm in 2011 and 2012.   Patient was warned about not using Cipro and similar antibiotics. Recent studies have raised concern that fluoroquinolone antibiotics could be associated with an increased risk of aortic aneurysm Fluoroquinolones have non-antimicrobial properties that might jeopardise the integrity of the extracellular matrix of the vascular wall In a  propensity score matched cohort study in [place], there was a 66% increased rate of aortic aneurysm or dissection associated with oral fluoroquinolone use, compared with amoxicillin use, within a 60 day risk period from start of treatment   Delight Ovens MD  Beeper [phone] Office [phone] 03/06/2017 12:39 PM

## 2017-03-14 ENCOUNTER — Ambulatory Visit (INDEPENDENT_AMBULATORY_CARE_PROVIDER_SITE_OTHER): Payer: BLUE CROSS/BLUE SHIELD | Admitting: *Deleted

## 2017-03-14 DIAGNOSIS — Z7901 Long term (current) use of anticoagulants: Secondary | ICD-10-CM | POA: Diagnosis not present

## 2017-03-14 DIAGNOSIS — Z952 Presence of prosthetic heart valve: Secondary | ICD-10-CM

## 2017-03-14 DIAGNOSIS — I359 Nonrheumatic aortic valve disorder, unspecified: Secondary | ICD-10-CM | POA: Diagnosis not present

## 2017-03-14 DIAGNOSIS — Z5181 Encounter for therapeutic drug level monitoring: Secondary | ICD-10-CM | POA: Diagnosis not present

## 2017-03-14 LAB — POCT INR: INR: 2.2

## 2017-03-14 NOTE — Patient Instructions (Signed)
Description   Today take 1.5 tablets then  continue on same dosage 1/2 tablet (2.5mg ) daily except 1 tablet (5mg ) on Mondays, Wednesdays, and Fridays  Recheck INR in 5 weeks. Call us with any medications Changes or concerns # [phone]

## 2017-03-31 DIAGNOSIS — L308 Other specified dermatitis: Secondary | ICD-10-CM | POA: Diagnosis not present

## 2017-04-18 ENCOUNTER — Ambulatory Visit (INDEPENDENT_AMBULATORY_CARE_PROVIDER_SITE_OTHER): Payer: BLUE CROSS/BLUE SHIELD | Admitting: Pharmacist

## 2017-04-18 DIAGNOSIS — Z952 Presence of prosthetic heart valve: Secondary | ICD-10-CM

## 2017-04-18 DIAGNOSIS — Z5181 Encounter for therapeutic drug level monitoring: Secondary | ICD-10-CM | POA: Diagnosis not present

## 2017-04-18 DIAGNOSIS — I359 Nonrheumatic aortic valve disorder, unspecified: Secondary | ICD-10-CM | POA: Diagnosis not present

## 2017-04-18 DIAGNOSIS — Z7901 Long term (current) use of anticoagulants: Secondary | ICD-10-CM

## 2017-04-18 LAB — POCT INR: INR: 2.5

## 2017-04-18 NOTE — Patient Instructions (Signed)
Description   Continue on same dosage 1/2 tablet (2.5mg) daily except 1 tablet (5mg) on Mondays, Wednesdays, and Fridays  Recheck INR in 6 weeks. Call us with any medications Changes or concerns # [phone]     

## 2017-05-30 ENCOUNTER — Ambulatory Visit (INDEPENDENT_AMBULATORY_CARE_PROVIDER_SITE_OTHER): Payer: BLUE CROSS/BLUE SHIELD | Admitting: *Deleted

## 2017-05-30 DIAGNOSIS — Z5181 Encounter for therapeutic drug level monitoring: Secondary | ICD-10-CM

## 2017-05-30 DIAGNOSIS — I359 Nonrheumatic aortic valve disorder, unspecified: Secondary | ICD-10-CM | POA: Diagnosis not present

## 2017-05-30 DIAGNOSIS — Z7901 Long term (current) use of anticoagulants: Secondary | ICD-10-CM | POA: Diagnosis not present

## 2017-05-30 DIAGNOSIS — Z952 Presence of prosthetic heart valve: Secondary | ICD-10-CM | POA: Diagnosis not present

## 2017-05-30 LAB — POCT INR: INR: 2.9 (ref 2.0–3.0)

## 2017-05-30 NOTE — Patient Instructions (Signed)
Description   Continue on same dosage 1/2 tablet (2.5mg) daily except 1 tablet (5mg) on Mondays, Wednesdays, and Fridays  Recheck INR in 6 weeks. Call us with any medications Changes or concerns # [phone]     

## 2017-06-19 ENCOUNTER — Telehealth: Payer: Self-pay | Admitting: Cardiology

## 2017-06-19 ENCOUNTER — Other Ambulatory Visit: Payer: Self-pay | Admitting: *Deleted

## 2017-06-19 MED ORDER — ATORVASTATIN CALCIUM 20 MG PO TABS: 20.0000 mg | ORAL_TABLET | Freq: Every day | ORAL | 0 refills | Status: DC

## 2017-06-19 NOTE — Telephone Encounter (Signed)
New message     *STAT* If patient is at the pharmacy, call can be transferred to refill team.   1. Which medications need to be refilled? (please list name of each medication and dose if known) atorvastatin (LIPITOR) 20 MG tablet and warfarin (COUMADIN) 5 MG tablet  2. Which pharmacy/location (including street and city if local pharmacy) is medication to be sent to?[id number] [place] - [address] [facility] [address]  3. Do they need a 30 day or 90 day supply?90

## 2017-06-19 NOTE — Telephone Encounter (Signed)
Rx has been sent in for Atorvastatin to [facility]. I will route to [facility] to fill Coumadin.

## 2017-06-20 MED ORDER — WARFARIN SODIUM 5 MG PO TABS: ORAL_TABLET | ORAL | 1 refills | Status: DC

## 2017-06-20 NOTE — Addendum Note (Signed)
Addended by: SUPPLE, MEGAN E on: 06/20/2017 08:39 AM   Modules accepted: Orders

## 2017-07-11 ENCOUNTER — Ambulatory Visit (INDEPENDENT_AMBULATORY_CARE_PROVIDER_SITE_OTHER): Payer: BLUE CROSS/BLUE SHIELD | Admitting: *Deleted

## 2017-07-11 DIAGNOSIS — Z7901 Long term (current) use of anticoagulants: Secondary | ICD-10-CM

## 2017-07-11 DIAGNOSIS — Z5181 Encounter for therapeutic drug level monitoring: Secondary | ICD-10-CM

## 2017-07-11 DIAGNOSIS — I359 Nonrheumatic aortic valve disorder, unspecified: Secondary | ICD-10-CM

## 2017-07-11 DIAGNOSIS — Z952 Presence of prosthetic heart valve: Secondary | ICD-10-CM | POA: Diagnosis not present

## 2017-07-11 LAB — POCT INR: INR: 2.2 (ref 2.0–3.0)

## 2017-07-11 NOTE — Patient Instructions (Signed)
Description   Today take 1.5 tablets then continue on same dosage 1/2 tablet (2.5mg ) daily except 1 tablet (5mg ) on Mondays, Wednesdays, and Fridays  Recheck INR in 4 weeks. Call us with any medications Changes or concerns # [phone]

## 2017-08-08 ENCOUNTER — Ambulatory Visit (INDEPENDENT_AMBULATORY_CARE_PROVIDER_SITE_OTHER): Payer: BLUE CROSS/BLUE SHIELD | Admitting: *Deleted

## 2017-08-08 DIAGNOSIS — Z7901 Long term (current) use of anticoagulants: Secondary | ICD-10-CM

## 2017-08-08 DIAGNOSIS — Z952 Presence of prosthetic heart valve: Secondary | ICD-10-CM

## 2017-08-08 DIAGNOSIS — I359 Nonrheumatic aortic valve disorder, unspecified: Secondary | ICD-10-CM | POA: Diagnosis not present

## 2017-08-08 DIAGNOSIS — Z5181 Encounter for therapeutic drug level monitoring: Secondary | ICD-10-CM

## 2017-08-08 LAB — POCT INR: INR: 2.8 (ref 2.0–3.0)

## 2017-08-08 NOTE — Patient Instructions (Signed)
Description   Continue on same dosage 1/2 tablet (2.5mg ) daily except 1 tablet (5mg ) on Mondays, Wednesdays, and Fridays  Recheck INR in 4 weeks. Call us with any medications Changes or concerns # [phone]

## 2017-08-26 ENCOUNTER — Encounter: Payer: Self-pay | Admitting: Cardiology

## 2017-09-05 ENCOUNTER — Ambulatory Visit (INDEPENDENT_AMBULATORY_CARE_PROVIDER_SITE_OTHER): Payer: BLUE CROSS/BLUE SHIELD | Admitting: Pharmacist

## 2017-09-05 DIAGNOSIS — Z952 Presence of prosthetic heart valve: Secondary | ICD-10-CM | POA: Diagnosis not present

## 2017-09-05 DIAGNOSIS — I359 Nonrheumatic aortic valve disorder, unspecified: Secondary | ICD-10-CM

## 2017-09-05 DIAGNOSIS — Z5181 Encounter for therapeutic drug level monitoring: Secondary | ICD-10-CM | POA: Diagnosis not present

## 2017-09-05 DIAGNOSIS — Z7901 Long term (current) use of anticoagulants: Secondary | ICD-10-CM | POA: Diagnosis not present

## 2017-09-05 LAB — POCT INR: INR: 3.3 — AB (ref 2.0–3.0)

## 2017-09-05 NOTE — Patient Instructions (Signed)
Description   Continue on same dosage 1/2 tablet (2.5mg ) daily except 1 tablet (5mg ) on Mondays, Wednesdays, and Fridays  Recheck INR in 5 weeks. Call us with any medications Changes or concerns # [phone]

## 2017-09-09 IMAGING — NM NM MISC PROCEDURE
6 series · 36 of 36 positions shown · non-contrast
Comparison: none

[Series 1: stress-sum-em · 6.40mm/px · 6 of 64 frames shown]
[frame 6/64]
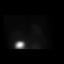
[frame 16/64]
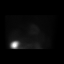
[frame 27/64]
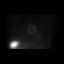
[frame 38/64]
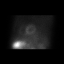
[frame 48/64]
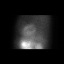
[frame 59/64]
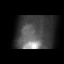

[Series 1: wbr_r-proj_st rest · 6.40mm/px · 6 of 64 frames shown]
[frame 6/64]
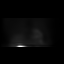
[frame 16/64]
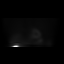
[frame 27/64]
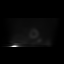
[frame 38/64]
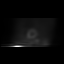
[frame 48/64]
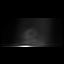
[frame 59/64]
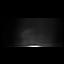

[Series 1: wbr_s-proj_st stress-gsp · 6.40mm/px · 6 of 512 frames shown]
[frame 43/512]
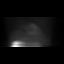
[frame 128/512]
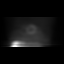
[frame 214/512]
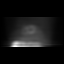
[frame 299/512]
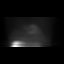
[frame 384/512]
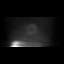
[frame 470/512]
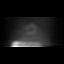

[Series 1: rest · 6.40mm/px · 6 of 64 frames shown]
[frame 6/64]
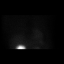
[frame 16/64]
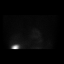
[frame 27/64]
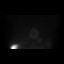
[frame 38/64]
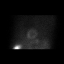
[frame 48/64]
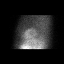
[frame 59/64]
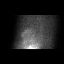

[Series 1: wbr_s-proj_st stress-sum-em · 6.40mm/px · 6 of 64 frames shown]
[frame 6/64]
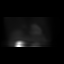
[frame 16/64]
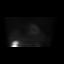
[frame 27/64]
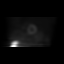
[frame 38/64]
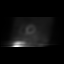
[frame 48/64]
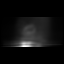
[frame 59/64]
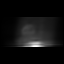

[Series 1: stress-gsp · 6.40mm/px · 6 of 512 frames shown]
[frame 43/512]
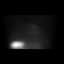
[frame 128/512]
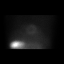
[frame 214/512]
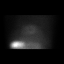
[frame 299/512]
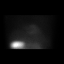
[frame 384/512]
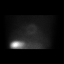
[frame 470/512]
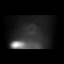

[36 of 36 positions shown; findings below may reference images not displayed]

Canned report from images found in remote index.

Refer to host system for actual result text.

## 2017-09-15 ENCOUNTER — Other Ambulatory Visit: Payer: Self-pay | Admitting: Cardiology

## 2017-09-30 ENCOUNTER — Other Ambulatory Visit: Payer: Self-pay | Admitting: Cardiology

## 2017-10-03 ENCOUNTER — Ambulatory Visit: Payer: BLUE CROSS/BLUE SHIELD | Admitting: Cardiology

## 2017-10-06 ENCOUNTER — Ambulatory Visit: Payer: BLUE CROSS/BLUE SHIELD | Admitting: Cardiology

## 2017-10-06 ENCOUNTER — Encounter: Payer: Self-pay | Admitting: Cardiology

## 2017-10-06 VITALS — BP 110/76 | HR 63 | Ht 69.0 in | Wt 157.8 lb

## 2017-10-06 DIAGNOSIS — Z952 Presence of prosthetic heart valve: Secondary | ICD-10-CM | POA: Diagnosis not present

## 2017-10-06 DIAGNOSIS — I2510 Atherosclerotic heart disease of native coronary artery without angina pectoris: Secondary | ICD-10-CM | POA: Diagnosis not present

## 2017-10-06 DIAGNOSIS — I7121 Aneurysm of the ascending aorta, without rupture: Secondary | ICD-10-CM

## 2017-10-06 DIAGNOSIS — I712 Thoracic aortic aneurysm, without rupture: Secondary | ICD-10-CM | POA: Diagnosis not present

## 2017-10-06 DIAGNOSIS — E782 Mixed hyperlipidemia: Secondary | ICD-10-CM | POA: Diagnosis not present

## 2017-10-06 NOTE — Patient Instructions (Addendum)
Medication Instructions:  Your physician recommends that you continue on your current medications as directed. Please refer to the Current Medication list given to you today.   Labwork: None ordered  Testing/Procedures: None ordered  Follow-Up: Your physician wants you to follow-up in: 1 Year with Dr. Johnell Comings will receive a reminder letter in the mail two months in advance. If you don't receive a letter, please call our office to schedule the follow-up appointment.    Any Other Special Instructions Will Be Listed Below (If Applicable).   High Cholesterol High cholesterol is a condition in which the blood has high levels of a white, waxy, fat-like substance (cholesterol). The human body needs small amounts of cholesterol. The liver makes all the cholesterol that the body needs. Extra (excess) cholesterol comes from the food that we eat. Cholesterol is carried from the liver by the blood through the blood vessels. If you have high cholesterol, deposits (plaques) may build up on the walls of your blood vessels (arteries). Plaques make the arteries narrower and stiffer. Cholesterol plaques increase your risk for heart attack and stroke. Work with your health care provider to keep your cholesterol levels in a healthy range. What increases the risk? This condition is more likely to develop in people who:  Eat foods that are high in animal fat (saturated fat) or cholesterol.  Are overweight.  Are not getting enough exercise.  Have a family history of high cholesterol.  What are the signs or symptoms? There are no symptoms of this condition. How is this diagnosed? This condition may be diagnosed from the results of a blood test.  If you are older than age 13, your health care provider may check your cholesterol every 4-6 years.  You may be checked more often if you already have high cholesterol or other risk factors for heart disease.  The blood test for cholesterol  measures:  "Bad" cholesterol (LDL cholesterol). This is the main type of cholesterol that causes heart disease. The desired level for LDL is less than 70  "Good" cholesterol (HDL cholesterol). This type helps to protect against heart disease by cleaning the arteries and carrying the LDL away. The desired level for HDL is 60 or higher.  Triglycerides. These are fats that the body can store or burn for energy. The desired number for triglycerides is lower than 150.  Total cholesterol. This is a measure of the total amount of cholesterol in your blood, including LDL cholesterol, HDL cholesterol, and triglycerides. A healthy number is less than 200.  How is this treated? This condition is treated with diet changes, lifestyle changes, and medicines. Diet changes  This may include eating more whole grains, fruits, vegetables, nuts, and fish.  This may also include cutting back on red meat and foods that have a lot of added sugar. Lifestyle changes  Changes may include getting at least 40 minutes of aerobic exercise 3 times a week. Aerobic exercises include walking, biking, and swimming. Aerobic exercise along with a healthy diet can help you maintain a healthy weight.  Changes may also include quitting smoking. Medicines  Medicines are usually given if diet and lifestyle changes have failed to reduce your cholesterol to healthy levels.  Your health care provider may prescribe a statin medicine. Statin medicines have been shown to reduce cholesterol, which can reduce the risk of heart disease. Follow these instructions at home: Eating and drinking  If told by your health care provider:  Eat chicken (without skin), fish,  veal, shellfish, ground [place] breast, and round or loin cuts of red meat.  Do not eat fried foods or fatty meats, such as hot dogs and salami.  Eat plenty of fruits, such as apples.  Eat plenty of vegetables, such as broccoli, potatoes, and carrots.  Eat beans, peas,  and lentils.  Eat grains such as barley, rice, couscous, and bulgur wheat.  Eat pasta without cream sauces.  Use skim or nonfat milk, and eat low-fat or nonfat yogurt and cheeses.  Do not eat or drink whole milk, cream, ice cream, egg yolks, or hard cheeses.  Do not eat stick margarine or tub margarines that contain trans fats (also called partially hydrogenated oils).  Do not eat saturated tropical oils, such as coconut oil and palm oil.  Do not eat cakes, cookies, crackers, or other baked goods that contain trans fats.  General instructions  Exercise as directed by your health care provider. Increase your activity level with activities such as gardening, walking, and taking the stairs.  Take over-the-counter and prescription medicines only as told by your health care provider.  Do not use any products that contain nicotine or tobacco, such as cigarettes and e-cigarettes. If you need help quitting, ask your health care provider.  Keep all follow-up visits as told by your health care provider. This is important. Contact a health care provider if:  You are struggling to maintain a healthy diet or weight.  You need help to start on an exercise program.  You need help to stop smoking. Get help right away if:  You have chest pain.  You have trouble breathing. This information is not intended to replace advice given to you by your health care provider. Make sure you discuss any questions you have with your health care provider. Document Released: 12/24/2004 Document Revised: 07/22/2015 Document Reviewed: 06/24/2015 Elsevier Interactive Patient Education  [employer].  If you need a refill on your cardiac medications before your next appointment, please call your pharmacy.

## 2017-10-06 NOTE — Progress Notes (Signed)
10/06/2017 Ryan Lamb   [birth year]  [id number]  Primary Physician Durene Cal Aldine Contes, MD Primary Cardiologist: Dr. Delton See   Reason for Visit/CC: 1 Year f/u for Aortic Valve Disease and Aortic Root Dilatation.   HPI:  Ryan Lamb is a 62 y.o. male with prior medical history of aortic stenosis in a trileaflet valve, status post aVR with 23 mm St. to mechanical prosthesis in 1994 by Dr. Tyrone Sage. In December of 2011 because of a chest x-ray suggestive of dilatation of his descending aorta, he was referred back to Dr. Tyrone Sage. Since 2011, he has been under surveillance with yearly CT scans to monitor for enlargement. This is followed by Dr. Tyrone Sage. His last scan  02/2017 showed stable dilatation of the ascending thoracic aorta which measuresapproximately 4.8 cm in maximum diameter.  There was also noted to be stable coronary atherosclerosis with calcified plaque in the distribution of the LAD, however he denies any anginal symptomatology. He is also on chronic anticoagulation w/ coumadin given mechanical AV. His INRs are followed in our coumadin clinic.   He reports to clinic today for his yearly f/u.  He reports that he is doing well.  He denies any cardiac symptoms including no chest pain, dyspnea, palpitations, orthopnea, PND, lower extremity edema, dizziness, syncope/near syncope.  He is very active and exercises regularly without any exertional symptoms.  His blood pressure is well controlled at 110/76 without any medications.  EKG shows sinus rhythm with PACs with heart rate of 63 bpm. No ischemic abnormalities.  He is on Lipitor for hyperlipidemia which is followed by his PCP Dr. Tana Conch.  His last lipid panel December 12, 2016 showed slightly elevated LDL at 82 mg/dL.  He has his yearly follow-up with Dr. Durene Cal on December 9 and plans to get repeat lipid panel.  He denies any personal history of diabetes and no use of tobacco products.    Current Meds    Medication Sig  . aspirin 325 MG tablet Take 325 mg by mouth as needed for mild pain or headache.   Marland Kitchen atorvastatin (LIPITOR) 20 MG tablet Take 1 tablet (20 mg total) by mouth daily at 6 PM. Please make overdue appt with Dr. Delton See before anymore refills. 3rd and Final Attempt  . MULTIPLE VITAMIN PO Take 1 tablet by mouth daily.   . pantoprazole (PROTONIX) 20 MG tablet Take 20 mg by mouth daily as needed for heartburn or indigestion.  Marland Kitchen warfarin (COUMADIN) 5 MG tablet Take 1/2 to 1 tablet daily as directed by Coumadin clinic   Allergies  Allergen Reactions  . Penicillins Rash    Reaction unknown may have been a rash   Past Medical History:  Diagnosis Date  . Aortic stenosis   . Aortic valve prosthesis present   . Dissecting aortic aneurysm (any part), thoracic (HCC)   . Diverticulosis   . Gastric ulcer    due to nsaids in past  . Hypercholesterolemia   . Mechanical heart valve present    Family History  Problem Relation Age of Onset  . Coronary artery disease Mother        CABG  . Coronary artery disease Father        aortic valve replacement  . Skin cancer Father   . Healthy Sister   . Healthy Brother        overweight  . Schizophrenia Sister   . GI problems Neg Hx    Past Surgical History:  Procedure Laterality Date  .  CARDIAC VALVE REPLACEMENT    . EEC     with propofol   . ESOPHAGOGASTRODUODENOSCOPY  01/2010, 04/2010  . ESOPHAGOGASTRODUODENOSCOPY     cauterize after nsaid related ulcer  . EXPLORATION POST OPERATIVE OPEN HEART  1994, 1972   Social History   Socioeconomic History  . Marital status: Single    Spouse name: Not on file  . Number of children: Not on file  . Years of education: Not on file  . Highest education level: Not on file  Occupational History  . Not on file  Social Needs  . Financial resource strain: Not on file  . Food insecurity:    Worry: Not on file    Inability: Not on file  . Transportation needs:    Medical: Not on file     Non-medical: Not on file  Tobacco Use  . Smoking status: Former Smoker    Packs/day: 0.50    Years: 5.00    Pack years: 2.50    Last attempt to quit: 10/17/1977    Years since quitting: 39.9  . Smokeless tobacco: Never Used  Substance and Sexual Activity  . Alcohol use: No  . Drug use: No  . Sexual activity: Not on file  Lifestyle  . Physical activity:    Days per week: Not on file    Minutes per session: Not on file  . Stress: Not on file  Relationships  . Social connections:    Talks on phone: Not on file    Gets together: Not on file    Attends religious service: Not on file    Active member of club or organization: Not on file    Attends meetings of clubs or organizations: Not on file    Relationship status: Not on file  . Intimate partner violence:    Fear of current or ex partner: Not on file    Emotionally abused: Not on file    Physically abused: Not on file    Forced sexual activity: Not on file  Other Topics Concern  . Not on file  Social History Narrative   Single. Lives with parents right now.       Undergrad at [employer], Masters oral roberts- divinity. Hard to get into ministry.    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  Cardiovascular: negative for chest pain, dyspnea on exertion, edema, orthopnea, palpitations, paroxysmal nocturnal dyspnea or shortness of breath Dermatological: negative for rash Respiratory: negative for cough or wheezing Urologic: negative for hematuria Abdominal: negative for nausea, vomiting, diarrhea, bright red blood per rectum, melena, or hematemesis Neurologic: negative for visual changes, syncope, or dizziness All other systems reviewed and are otherwise negative except as noted above.   Physical Exam:  Blood pressure 110/76, pulse 63,  height 5\' 9"  (1.753 m), weight 157 lb 12.8 oz (71.6 kg).  General appearance: alert, cooperative and no distress Neck: no carotid bruit and no JVD Lungs: clear to auscultation bilaterally Heart: regular rate and rhythm, S1, S2 normal, no murmur, click, rub or gallop Extremities: extremities normal, atraumatic, no cyanosis or edema Pulses: 2+ and symmetric Skin: Skin color, texture, turgor normal. No rashes or lesions Neurologic: Grossly normal  EKG sinus rhythm with PACs, 63 bpm-- personally reviewed   ASSESSMENT AND PLAN:   1.  Mechanical aortic valve: status post mechanical aortic valve replacement in 1994 per Dr. Tyrone Sage for aortic stenosis.  He is on chronic anticoagulation with Coumadin and denies any chest pain or dyspnea.  Crisp mechanical valve sounds noted on auscultation.  2.  Chronic anticoagulation: on chronic Coumadin for anticoagulation given mechanical aortic valve.  His INRs are followed in our Coumadin clinic.  He reports full compliance.  His INRs have been well regulated.  He denies any abnormal bleeding and no falls.  3.  Dilated aortic root: Followed by Dr. Tyrone Sage. His last scan 02/2017 showed stable dilatation of the ascending thoracic aorta which measuresapproximately 4.8 cm in maximum diameter.  Blood pressure and heart rate are both well controlled.  4.  CAD: There was also noted to be stable coronary atherosclerosis with calcified plaque in the distribution of the LAD on recent chest CT, however he denies any anginal symptomatology.  He is very active and denies any exertional chest pain or dyspnea.  Blood pressure and heart rate are both well controlled.  EKG shows sinus rhythm without any ischemic abnormalities.  Continue aspirin and statin therapy with Lipitor with goal LDL less than 70  5.  Hyperlipidemia:  on Lipitor 20 mg and lipid panel was followed by his PCP Dr. Tana Conch.  Last lipid panel December 2018 showed his LDL to be slightly above goal at 82  mg/dL.  We discussed findings of coronary plaque on latest chest CT and I recommended his LDL goal be less than 70 mg/dL.  He has follow-up with Dr. Durene Cal December 15, 2017, and plans to have repeat lipid panel done.  If LDL is not less than 70, I recommend further increasing atorvastatin to 40 mg daily. He was encouraged to continue regular physical activity.    Follow-Up with Dr. Delton See in 1 year  Ketzia Guzek Delmer Islam, MHS [facility] 10/06/2017 8:55 AM

## 2017-10-10 ENCOUNTER — Ambulatory Visit (INDEPENDENT_AMBULATORY_CARE_PROVIDER_SITE_OTHER): Payer: BLUE CROSS/BLUE SHIELD | Admitting: *Deleted

## 2017-10-10 DIAGNOSIS — I359 Nonrheumatic aortic valve disorder, unspecified: Secondary | ICD-10-CM | POA: Diagnosis not present

## 2017-10-10 DIAGNOSIS — Z5181 Encounter for therapeutic drug level monitoring: Secondary | ICD-10-CM

## 2017-10-10 DIAGNOSIS — Z7901 Long term (current) use of anticoagulants: Secondary | ICD-10-CM | POA: Diagnosis not present

## 2017-10-10 DIAGNOSIS — Z952 Presence of prosthetic heart valve: Secondary | ICD-10-CM | POA: Diagnosis not present

## 2017-10-10 LAB — POCT INR: INR: 3.2 — AB (ref 2.0–3.0)

## 2017-10-10 NOTE — Patient Instructions (Signed)
Description   Continue on same dosage 1/2 tablet (2.5mg) daily except 1 tablet (5mg) on Mondays, Wednesdays, and Fridays  Recheck INR in 5 weeks. Call us with any medications Changes or concerns # [phone]     

## 2017-10-28 ENCOUNTER — Other Ambulatory Visit: Payer: Self-pay | Admitting: Cardiology

## 2017-11-14 ENCOUNTER — Ambulatory Visit (INDEPENDENT_AMBULATORY_CARE_PROVIDER_SITE_OTHER): Payer: BLUE CROSS/BLUE SHIELD | Admitting: Pharmacist

## 2017-11-14 DIAGNOSIS — Z5181 Encounter for therapeutic drug level monitoring: Secondary | ICD-10-CM | POA: Diagnosis not present

## 2017-11-14 DIAGNOSIS — Z7901 Long term (current) use of anticoagulants: Secondary | ICD-10-CM | POA: Diagnosis not present

## 2017-11-14 DIAGNOSIS — Z952 Presence of prosthetic heart valve: Secondary | ICD-10-CM | POA: Diagnosis not present

## 2017-11-14 DIAGNOSIS — I359 Nonrheumatic aortic valve disorder, unspecified: Secondary | ICD-10-CM | POA: Diagnosis not present

## 2017-11-14 LAB — POCT INR: INR: 2.6 (ref 2.0–3.0)

## 2017-11-14 NOTE — Patient Instructions (Signed)
Description   Continue on same dosage 1/2 tablet (2.5mg) daily except 1 tablet (5mg) on Mondays, Wednesdays, and Fridays  Recheck INR in 6 weeks. Call us with any medications Changes or concerns # [phone]     

## 2017-12-12 ENCOUNTER — Other Ambulatory Visit: Payer: Self-pay | Admitting: Cardiology

## 2017-12-15 ENCOUNTER — Encounter: Payer: Self-pay | Admitting: Family Medicine

## 2017-12-15 ENCOUNTER — Ambulatory Visit (INDEPENDENT_AMBULATORY_CARE_PROVIDER_SITE_OTHER): Payer: BLUE CROSS/BLUE SHIELD | Admitting: Family Medicine

## 2017-12-15 VITALS — BP 122/72 | HR 83 | Temp 98.4°F | Ht 69.0 in | Wt 157.2 lb

## 2017-12-15 DIAGNOSIS — Z125 Encounter for screening for malignant neoplasm of prostate: Secondary | ICD-10-CM

## 2017-12-15 DIAGNOSIS — Z8349 Family history of other endocrine, nutritional and metabolic diseases: Secondary | ICD-10-CM

## 2017-12-15 DIAGNOSIS — E7800 Pure hypercholesterolemia, unspecified: Secondary | ICD-10-CM

## 2017-12-15 DIAGNOSIS — Z23 Encounter for immunization: Secondary | ICD-10-CM | POA: Diagnosis not present

## 2017-12-15 DIAGNOSIS — I7121 Aneurysm of the ascending aorta, without rupture: Secondary | ICD-10-CM

## 2017-12-15 DIAGNOSIS — I712 Thoracic aortic aneurysm, without rupture: Secondary | ICD-10-CM | POA: Diagnosis not present

## 2017-12-15 DIAGNOSIS — Z0000 Encounter for general adult medical examination without abnormal findings: Secondary | ICD-10-CM

## 2017-12-15 DIAGNOSIS — Z952 Presence of prosthetic heart valve: Secondary | ICD-10-CM

## 2017-12-15 LAB — TSH: TSH: 1.53 u[IU]/mL (ref 0.35–4.50)

## 2017-12-15 LAB — LIPID PANEL
Cholesterol: 146 mg/dL (ref 0–200)
HDL: 43.1 mg/dL (ref >39.00)
LDL Cholesterol: 86 mg/dL (ref 0–99)
NonHDL: 102.89
Total CHOL/HDL Ratio: 3
Triglycerides: 82 mg/dL (ref 0.0–149.0)
VLDL: 16.4 mg/dL (ref 0.0–40.0)

## 2017-12-15 LAB — COMPREHENSIVE METABOLIC PANEL
ALT: 11 U/L (ref 0–53)
AST: 16 U/L (ref 0–37)
Albumin: 4.1 g/dL (ref 3.5–5.2)
Alkaline Phosphatase: 66 U/L (ref 39–117)
BUN: 10 mg/dL (ref 6–23)
CO2: 30 meq/L (ref 19–32)
Calcium: 9.2 mg/dL (ref 8.4–10.5)
Chloride: 102 mEq/L (ref 96–112)
Creatinine, Ser: 0.87 mg/dL (ref 0.40–1.50)
GFR: 94.75 mL/min (ref >60.00)
Glucose, Bld: 94 mg/dL (ref 70–99)
Potassium: 3.6 mEq/L (ref 3.5–5.1)
Sodium: 140 mEq/L (ref 135–145)
Total Bilirubin: 1 mg/dL (ref 0.2–1.2)
Total Protein: 6.4 g/dL (ref 6.0–8.3)

## 2017-12-15 LAB — CBC
HCT: 42.7 % (ref 39.0–52.0)
HEMOGLOBIN: 14.5 g/dL (ref 13.0–17.0)
MCHC: 33.8 g/dL (ref 30.0–36.0)
MCV: 89.9 fl (ref 78.0–100.0)
Platelets: 282 10*3/uL (ref 150.0–400.0)
RBC: 4.75 Mil/uL (ref 4.22–5.81)
RDW: 12.8 % (ref 11.5–15.5)
WBC: 13.1 10*3/uL — ABNORMAL HIGH (ref 4.0–10.5)

## 2017-12-15 LAB — PSA: PSA: 1.85 ng/mL (ref 0.10–4.00)

## 2017-12-15 NOTE — Patient Instructions (Addendum)
Health Maintenance Due  Topic Date Due  . INFLUENZA VACCINE-thanks for doing this today already 08/07/2017   Shingrix #1 today. Repeat injection in 2-5 months. Schedule this nurse visit at check out desk before you leave  Please stop by lab before you go

## 2017-12-15 NOTE — Progress Notes (Signed)
Phone: [phone]  Subjective:  Patient presents today for their annual physical. Chief complaint-noted.   See problem oriented charting- ROS- full  review of systems was completed and negative except for: post nasal drip, voice change  The following were reviewed and entered/updated in epic: Past Medical History:  Diagnosis Date  . Aortic stenosis   . Aortic valve prosthesis present   . Dissecting aortic aneurysm (any part), thoracic (HCC)   . Diverticulosis   . Gastric ulcer    due to nsaids in past  . Hypercholesterolemia   . Mechanical heart valve present    Patient Active Problem List   Diagnosis Date Noted  . Ascending aortic aneurysm (HCC) 10/18/2010    Priority: High  . Hx of mechanical aortic valve replacement     Priority: High  . GERD (gastroesophageal reflux disease) 04/22/2011    Priority: Medium  . Hypercholesterolemia     Priority: Medium  . Long term (current) use of anticoagulants 05/08/2010    Priority: Low  . History of adenomatous polyp of colon 12/11/2015  . Encounter for therapeutic drug monitoring 12/08/2015   Past Surgical History:  Procedure Laterality Date  . CARDIAC VALVE REPLACEMENT    . EEC     with propofol   . ESOPHAGOGASTRODUODENOSCOPY  01/2010, 04/2010  . ESOPHAGOGASTRODUODENOSCOPY     cauterize after nsaid related ulcer  . EXPLORATION POST OPERATIVE OPEN HEART  1994, 1972    Family History  Problem Relation Age of Onset  . Coronary artery disease Mother        CABG. 81 in 2019.   Marland Kitchen. Coronary artery disease Father        aortic valve replacement  . Skin cancer Father         683 in 2019.   Marland Kitchen. Healthy Sister   . Healthy Brother        overweight  . Schizophrenia Sister   . GI problems Neg Hx     Medications- reviewed and updated Current Outpatient Medications  Medication Sig Dispense Refill  . aspirin 325 MG tablet Take 325 mg by mouth as needed for mild pain or headache.     Marland Kitchen. atorvastatin (LIPITOR) 20 MG tablet TAKE 1  TABLET BY MOUTH AT 6 PM. PLEASE MAKE APPOINTMENT FOR MORE REFILLS 90 tablet 3  . MULTIPLE VITAMIN PO Take 1 tablet by mouth daily.     . pantoprazole (PROTONIX) 20 MG tablet Take 20 mg by mouth daily as needed for heartburn or indigestion.    Marland Kitchen. warfarin (COUMADIN) 5 MG tablet TAKE 1/2 TO 1 TABLET BY MOUTH DAILY AS DIRECTED BY COUMADIN CLINIC 90 tablet 1   No current facility-administered medications for this visit.     Allergies-reviewed and updated Allergies  Allergen Reactions  . Penicillins Rash    Reaction unknown may have been a rash    Social History   Social History Narrative   Single. Lives with parents right now.       Undergrad at [employer], Masters oral roberts- divinity. Hard to get into ministry.    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 Oropharynx normal Neck: no thyromegaly CV: RRR no murmurs rubs or gallops Lungs: CTAB no crackles, wheeze, rhonchi Abdomen: soft/nontender/nondistended/normal bowel sounds. No rebound or guarding.  Ext: no edema Skin: warm, dry Neuro: grossly normal, moves all extremities, PERRLA  Rectal deferred unless concerning PSA trend  Assessment/Plan:  62 y.o. male presenting for annual physical.  Health Maintenance counseling: 1. Anticipatory guidance: Patient counseled regarding regular dental exams -q6 months, eye exams -yearly with Dr. Nile Riggs,  avoiding smoking and second hand smoke , limiting alcohol to 2 beverages per day - doesn't drink.   2. Risk factor reduction:  Advised patient of need for regular exercise and diet rich and fruits and vegetables to  reduce risk of heart attack and stroke. Exercise-last year was doing 4 to 5 days a week with walking, abs, 50-100 push-ups-this year has kept this up for the most part. Diet-he set a weight goal of 153 last year but honestly his weight is fine up to 160-reasonable diet.  Wt Readings from Last 3 Encounters:  12/15/17 157 lb 3.2 oz (71.3 kg)  10/06/17 157 lb 12.8 oz (71.6 kg)  03/06/17 160 lb (72.6 kg)  3. Immunizations/screenings/ancillary studies-flu shot- already had.  and Shingrix immunizations started today.   Immunization History  Administered Date(s) Administered  . Influenza,inj,Quad PF,6+ Mos 11/21/2014, 08/10/2015, 12/12/2016, 10/04/2017  . Tdap 11/21/2014  4. Prostate cancer screening- low risk prior PSA trend-defer rectal exam unless concerning trend. 3x a night- taking prostagenics supplement and has found this helpful. Likely BPH underlying.  Lab Results  Component Value Date   PSA 1.26 02/17/2017   PSA 1.45 12/12/2016   PSA 0.87 12/07/2015   5. Colon cancer screening - 08/16/2015 with five-year follow-up due to adenomatous colon polyp history 6. Skin cancer screening-saw dermatology for check up this year- for dermatitis- no full skin  Advised regular sunscreen use. Denies worrisome, changing, or new skin lesions. Waist up skin exam without any obvious cancerous or precancerous lesions 7.  Former smoker-quit in 1979 though with 2.5 pack years-will be candidate for AAA screening at 18 - known aortic aneurysm ascending  Status of chronic or acute concerns   PRN- Sees Dr. Haroldine Laws for ENT issues related to hearing loss and inner ear issues. Not wearing hearing aids.   Patient with known aortic aneurysm-follows with cardiothoracic surgery. most recent scan February 2019-stable exam at 4.8 cm maximum.   Status post mechanical aortic valve replacement- he remains on Coumadin for [facility] mechanical valve and follows with Dr. Delton See.  Hyperlipidemia- LDL was at goal last year once  we increase atorvastatin to 20 mg.    Mom with thyroid issues and he wants thyroid checked again this year-normal last year  No problem-specific Assessment & Plan notes found for this encounter.  Future Appointments  Date Time Provider Department Center  12/26/2017  8:30 AM [facility]   No follow-ups on file.  Lab/Order associations: Fasting  Preventative health care - Plan: TSH, CBC, Lipid panel, Comprehensive metabolic panel, PSA  Ascending aortic aneurysm (HCC)  Hypercholesterolemia - Plan: TSH, CBC, Lipid panel, Comprehensive metabolic panel  Screening for prostate cancer - Plan: PSA  Family history of hypothyroidism - Plan: TSH  Hx of mechanical aortic valve replacement  Return precautions advised.  Tana Conch, MD

## 2017-12-15 NOTE — Addendum Note (Signed)
Addended by: Vicente MalesSOUTHERN HIZER, JAMIE M on: 12/15/2017 09:28 AM   Modules accepted: Orders

## 2017-12-16 DIAGNOSIS — H9041 Sensorineural hearing loss, unilateral, right ear, with unrestricted hearing on the contralateral side: Secondary | ICD-10-CM | POA: Diagnosis not present

## 2017-12-16 DIAGNOSIS — H9311 Tinnitus, right ear: Secondary | ICD-10-CM | POA: Diagnosis not present

## 2017-12-16 DIAGNOSIS — H8111 Benign paroxysmal vertigo, right ear: Secondary | ICD-10-CM | POA: Diagnosis not present

## 2017-12-16 DIAGNOSIS — H8101 Meniere's disease, right ear: Secondary | ICD-10-CM | POA: Diagnosis not present

## 2017-12-17 ENCOUNTER — Other Ambulatory Visit: Payer: Self-pay

## 2017-12-17 DIAGNOSIS — D72829 Elevated white blood cell count, unspecified: Secondary | ICD-10-CM

## 2017-12-17 DIAGNOSIS — R972 Elevated prostate specific antigen [PSA]: Secondary | ICD-10-CM

## 2017-12-25 DIAGNOSIS — H2513 Age-related nuclear cataract, bilateral: Secondary | ICD-10-CM | POA: Diagnosis not present

## 2017-12-26 DIAGNOSIS — H9041 Sensorineural hearing loss, unilateral, right ear, with unrestricted hearing on the contralateral side: Secondary | ICD-10-CM | POA: Diagnosis not present

## 2017-12-26 DIAGNOSIS — H8101 Meniere's disease, right ear: Secondary | ICD-10-CM | POA: Diagnosis not present

## 2017-12-26 DIAGNOSIS — J320 Chronic maxillary sinusitis: Secondary | ICD-10-CM | POA: Diagnosis not present

## 2017-12-26 DIAGNOSIS — J322 Chronic ethmoidal sinusitis: Secondary | ICD-10-CM | POA: Diagnosis not present

## 2018-01-01 ENCOUNTER — Ambulatory Visit (INDEPENDENT_AMBULATORY_CARE_PROVIDER_SITE_OTHER): Payer: BLUE CROSS/BLUE SHIELD | Admitting: *Deleted

## 2018-01-01 DIAGNOSIS — Z952 Presence of prosthetic heart valve: Secondary | ICD-10-CM | POA: Diagnosis not present

## 2018-01-01 DIAGNOSIS — Z5181 Encounter for therapeutic drug level monitoring: Secondary | ICD-10-CM | POA: Diagnosis not present

## 2018-01-01 DIAGNOSIS — Z7901 Long term (current) use of anticoagulants: Secondary | ICD-10-CM | POA: Diagnosis not present

## 2018-01-01 DIAGNOSIS — I359 Nonrheumatic aortic valve disorder, unspecified: Secondary | ICD-10-CM | POA: Diagnosis not present

## 2018-01-01 LAB — POCT INR: INR: 3 (ref 2.0–3.0)

## 2018-01-01 NOTE — Patient Instructions (Signed)
Description   Continue on same dosage 1/2 tablet (2.5mg ) daily except 1 tablet (5mg ) on Mondays, Wednesdays, and Fridays  Recheck INR in 6 weeks. Call us with any medications Changes or concerns # [phone]

## 2018-01-12 ENCOUNTER — Other Ambulatory Visit: Payer: Self-pay | Admitting: Cardiothoracic Surgery

## 2018-01-12 DIAGNOSIS — I712 Thoracic aortic aneurysm, without rupture, unspecified: Secondary | ICD-10-CM

## 2018-02-13 ENCOUNTER — Ambulatory Visit (INDEPENDENT_AMBULATORY_CARE_PROVIDER_SITE_OTHER): Payer: BLUE CROSS/BLUE SHIELD | Admitting: Pharmacist

## 2018-02-13 DIAGNOSIS — I359 Nonrheumatic aortic valve disorder, unspecified: Secondary | ICD-10-CM

## 2018-02-13 DIAGNOSIS — Z952 Presence of prosthetic heart valve: Secondary | ICD-10-CM | POA: Diagnosis not present

## 2018-02-13 DIAGNOSIS — Z7901 Long term (current) use of anticoagulants: Secondary | ICD-10-CM | POA: Diagnosis not present

## 2018-02-13 DIAGNOSIS — Z5181 Encounter for therapeutic drug level monitoring: Secondary | ICD-10-CM

## 2018-02-13 LAB — POCT INR: INR: 2.4 (ref 2.0–3.0)

## 2018-02-13 NOTE — Patient Instructions (Signed)
Take 1.5 tablets tonight, then continue on same dosage 1/2 tablet (2.5mg ) daily except 1 tablet (5mg ) on Mondays, Wednesdays, and Fridays  Recheck INR in 6 weeks. Call us with any medications Changes or concerns # [phone]

## 2018-02-26 ENCOUNTER — Ambulatory Visit
Admission: RE | Admit: 2018-02-26 | Discharge: 2018-02-26 | Disposition: A | Discharge status: Home / Self Care | Payer: BLUE CROSS/BLUE SHIELD | Source: Ambulatory Visit | Attending: Cardiothoracic Surgery | Admitting: Cardiothoracic Surgery

## 2018-02-26 ENCOUNTER — Ambulatory Visit: Payer: BLUE CROSS/BLUE SHIELD | Admitting: Cardiothoracic Surgery

## 2018-02-26 ENCOUNTER — Other Ambulatory Visit: Payer: Self-pay

## 2018-02-26 ENCOUNTER — Encounter: Payer: Self-pay | Admitting: Radiology

## 2018-02-26 VITALS — BP 124/75 | HR 79 | Resp 18 | Ht 69.0 in | Wt 158.4 lb

## 2018-02-26 DIAGNOSIS — I712 Thoracic aortic aneurysm, without rupture, unspecified: Secondary | ICD-10-CM

## 2018-02-26 DIAGNOSIS — I7121 Aneurysm of the ascending aorta, without rupture: Secondary | ICD-10-CM

## 2018-02-26 MED ORDER — IOPAMIDOL (ISOVUE-370) INJECTION 76%
75.0000 mL | Freq: Once | INTRAVENOUS | Status: AC | PRN
  Administered 2018-02-26: 75 mL via INTRAVENOUS

## 2018-02-26 NOTE — Progress Notes (Signed)
[address]       [place] [address]             [phone]         [facility] Medical Record #[id number] Date of Birth: 10/13/1955  Referring: Shelva Majestic, MD Primary Care: Shelva Majestic, MD Primary cardiologist: Dr Delton See  Chief Complaint:    Chief Complaint  Patient presents with  . Thoracic Aortic Aneurysm    1 year f/u with CTA Chest    History of Present Illness:     Patient returns today with a yearly follow-up CTA of the chest.  In 1971 the patient had aortic valvuloplasty. Subsequently in 1994 I replaced his aortic valve with a #23 [employer] mechanical prosthesis. At the time of surgery   4/20 1994 He was noted to have a tri leaflet aortic valve.  In December of 2011 because of a chest x-ray suggestive of dilatation of his descending aorta he was seen again and has had CT scans of the chest in 2011, 2012,2013, 2014 , 2015,2017   and today. He has no family history of connective tissue or Marfan's disorder. He remains asymptomatic. He notes he follows his blood pressure carefully and has not been hypertensive. He continues to follow endocarditis precautions especially with dental work. He had echocardiogram and nuclear stress test done in August 2017.  He has been he has been diligent about Coumadin follow-up and had very little problems with long-term Coumadin use.  Current Activity/ Functional Status: Patient remains active without physical limitations   Past Medical History  Diagnosis Date  . Aortic stenosis as infant, with recurrent as and valve replacement   . Aortic valve prosthesis present   . Hypercholesterolemia     Past Surgical History  Procedure Date  . Cardiac valve replacement- mechanical aortic valve  1994     There is no family history of Marfan's or other connective tissue disorder or sudden death from dissection .  History   Social History  . Marital Status: Single     Spouse Name: N/A    Number of Children: N/A  . Years of Education: N/A   Occupational History  . Not on file.   Social History Main Topics  . Smoking status: Former Smoker -- 0.5 packs/day for 5 years    Quit date: 10/17/1977  . Smokeless tobacco: Never Used  . Alcohol Use: No  . Drug Use: No              Social History Narrative  . No narrative on file    Social History   Tobacco Use  Smoking Status Former Smoker  . Packs/day: 0.50  . Years: 5.00  . Pack years: 2.50  . Last attempt to quit: 10/17/1977  . Years since quitting: 40.3  Smokeless Tobacco Never Used    Social History   Substance and Sexual Activity  Alcohol Use No     Allergies  Allergen Reactions  . Penicillins Rash    Reaction unknown may have been a rash    Current Outpatient Medications  Medication Sig Dispense Refill  . aspirin 325 MG tablet Take 325 mg by mouth as needed for mild pain or headache.     Marland Kitchen atorvastatin (LIPITOR) 20 MG tablet TAKE 1 TABLET BY MOUTH AT 6 PM. PLEASE MAKE APPOINTMENT FOR  MORE REFILLS 90 tablet 3  . MULTIPLE VITAMIN PO Take 1 tablet by mouth daily.     . pantoprazole (PROTONIX) 20 MG tablet Take 20 mg by mouth daily as needed for heartburn or indigestion.    Marland Kitchen warfarin (COUMADIN) 5 MG tablet TAKE 1/2 TO 1 TABLET BY MOUTH DAILY AS DIRECTED BY COUMADIN CLINIC 90 tablet 1   No current facility-administered medications for this visit.        Review of Systems:  Review of Systems  Constitutional: Negative.   HENT: Negative.   Eyes: Negative.   Respiratory: Negative.   Cardiovascular: Negative.   Gastrointestinal: Negative.   Genitourinary: Negative.   Musculoskeletal: Negative.   Skin: Negative.   Neurological: Negative.   Endo/Heme/Allergies: Negative.   Psychiatric/Behavioral: Negative.       Physical Exam: BP 124/75 (BP Location: Right Arm, Patient Position: Sitting, Cuff Size: Large)   Pulse 79   Resp 18   Ht 5\' 9"  (1.753 m)   Wt 158 lb  6.4 oz (71.8 kg)   SpO2 98% Comment: RA  BMI 23.39 kg/m  General appearance: alert and cooperative Head: Normocephalic, without obvious abnormality, atraumatic Neck: no adenopathy, no carotid bruit, no JVD, supple, symmetrical, trachea midline and thyroid not enlarged, symmetric, no tenderness/mass/nodules Lymph nodes: Cervical, supraclavicular, and axillary nodes normal. Resp: clear to auscultation bilaterally Cardio: mechanical heart tones, no m of ai GI: soft, non-tender; bowel sounds normal; no masses,  no organomegaly Extremities: extremities normal, atraumatic, no cyanosis or edema and Homans sign is negative, no sign of DVT Neurologic: Grossly normal   Diagnostic Studies & Laboratory data:     Recent Radiology Findings: Ct Angio Chest Aorta W/cm &/or Wo/cm  Result Date: 02/26/2018 CLINICAL DATA:  63 year old male with a history of thoracic aortic aneurysm EXAM: CT ANGIOGRAPHY CHEST WITH CONTRAST TECHNIQUE: Multidetector CT imaging of the chest was performed using the standard protocol during bolus administration of intravenous contrast. Multiplanar CT image reconstructions and MIPs were obtained to evaluate the vascular anatomy. CONTRAST:  75mL ISOVUE-370 IOPAMIDOL (ISOVUE-370) INJECTION 76% COMPARISON:  Most remote 12/11/2009 with multiple prior most recent 03/06/2017 FINDINGS: Cardiovascular: Heart: Heart size unchanged with no cardiomegaly. No pericardial fluid/thickening. Surgical changes of prior aortic valve repair. 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 Calcifications of the LAD. Aorta: My content per any is measurement of the aorta on the CT 12/11/2009 measures 4.7 cm in the greatest axial dimension of the ascending aorta. My measurement made today of the most recent CT comparison of 03/06/2017 is 4.8 cm. No periaortic fluid or inflammatory changes. No dissection. Minimal atherosclerosis of the aortic arch. Three vessel arch. Descending  thoracic aorta unremarkable with no aneurysm or significant atherosclerosis. Pulmonary arteries: Contrast bolus timing is not optimized for the pulmonary arteries. Unremarkable caliber of the proximal pulmonary arteries. Mediastinum/Nodes: No mediastinal adenopathy. Unremarkable thoracic esophagus. Unremarkable thoracic inlet.  Surgical changes of median sternotomy. Lungs/Pleura: Central airways are clear. No pleural effusion. No confluent airspace disease. No pneumothorax. Redemonstration of 6 mm nodule of the left upper lobe on image 70 of series 4. This is unchanged from the most recent comparison CT. This nodule was present on the most remote CT of 12/11/2009, measuring only 2 mm or 3 mm at that time. Upper Abdomen: No acute. Musculoskeletal: No acute displaced fracture. Degenerative changes of the spine. Review of the MIP images confirms the above findings. IMPRESSION: Redemonstration of ascending aortic aneurysm which measures 4.8 cm on today's CT. The diameter was  4.7 cm on the most remote CT of 12/11/2009. Aortic aneurysm NOS (ICD10-I71.9). Redemonstration of surgical changes of median sternotomy and aortic valve replacement. Redemonstration of 6 mm nodule of the left upper lobe. This is unchanged from the most recent comparison CT. This nodule appears to have been present in 2011, with slow interval growth. Attention on follow-up CT scans recommended. If/when the nodule approaches 8 mm-9 mm, a PET-CT may be useful. Aortic Atherosclerosis (ICD10-I70.0). Associated coronary artery disease. 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 Signed, Yvone Neu. Reyne Dumas, RPVI [facility] Radiology Electronically Signed   By: Gilmer Mor D.O.   On: 02/26/2018 13:55    I have independently reviewed the above radiology studies  and reviewed the findings with the patient.       Ct Angio Chest Aorta W/cm &/or Wo/cm  Result Date:  03/06/2017 CLINICAL DATA:  History of aortic valve replacement and dilatation of the ascending thoracic aorta. EXAM: CT ANGIOGRAPHY CHEST WITH CONTRAST TECHNIQUE: Multidetector CT imaging of the chest was performed using the standard protocol during bolus administration of intravenous contrast. Multiplanar CT image reconstructions and MIPs were obtained to evaluate the vascular anatomy. CONTRAST:  3mL ISOVUE-370 IOPAMIDOL (ISOVUE-370) INJECTION 76% Creatinine was obtained on site at [facility] at [address] Results: BUN 12, creatinine 0.9 mg/dL and estimated GFR 93 mL/minute COMPARISON:  12/28/2015 as well as additional annual CTA studies dating back to 2011. FINDINGS: Cardiovascular: Stable dilatation of the ascending thoracic aorta which measures approximately 4.8 cm in maximum diameter. The aortic root at the level of the sinuses of Valsalva measures approximately 3.7 cm. The proximal arch measures 3.7 cm. The distal arch measures 2.8 cm. The descending thoracic aorta measures 2.3 cm. Proximal great vessels show stable and normal patency. The heart size is stable. There is a stable small aneurysm at the left ventricular apex measuring roughly 8 x 9 mm. No pericardial fluid identified. Stable coronary atherosclerosis with calcified plaque in the distribution of the LAD. CT appearance of the prosthetic aortic valve is unremarkable. Central pulmonary arteries are normal in caliber. Mediastinum/Nodes: No enlarged mediastinal, hilar, or axillary lymph nodes. Thyroid gland, trachea, and esophagus demonstrate no significant findings. Lungs/Pleura: Well-circumscribed noncalcified pulmonary nodule in the anterior and medial aspect of the lingula again noted which currently measures approximately 5 x 6 mm. This nodule has very slowly enlarged over time, measuring approximately 3 mm in 2011 and 2012, 4 mm in 2013, 5 mm in 2015 and 5 mm in 2017. No other pulmonary nodules are identified. There is no  evidence of pulmonary edema, consolidation, pneumothorax or pleural fluid. Upper Abdomen: No acute abnormality. Musculoskeletal: No chest wall abnormality. No acute or significant osseous findings. Review of the MIP images confirms the above findings. IMPRESSION: 1. Stable dilatation of the ascending thoracic aorta which measures approximately 4.8 cm in maximum diameter. 2. Very slowly enlarging solitary lingular lung nodule which measures 6 mm in greatest diameter currently and measured 3 mm in 2011 and 2012. This continues to warrant follow-up. The nodule is likely still too small to be accurately assessed by PET scan. 3. 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 4. Stable coronary atherosclerosis with calcified plaque in the distribution of the LAD. Aortic aneurysm NOS (ICD10-I71.9). Electronically Signed   By: Irish Lack M.D.   On: 03/06/2017 11:46     Ct Angio Chest Aorta W &/or Wo Contrast  Result  Date: 12/28/2015 CLINICAL DATA:  Ascending aortic aneurysm.  Follow-up. EXAM: CT ANGIOGRAPHY CHEST WITH CONTRAST TECHNIQUE: Multidetector CT imaging of the chest was performed using the standard protocol during bolus administration of intravenous contrast. Multiplanar CT image reconstructions and MIPs were obtained to evaluate the vascular anatomy. CONTRAST:  75 mL Isovue 370 COMPARISON:  12/29/2014 FINDINGS: Cardiovascular: Unchanged ascending aortic aneurysm with maximal diameter of 4.8 cm. The aorta measures 4.6 cm at the sinuses of Valsalva and 3.2 cm at the sinotubular junction, the proximal transverse aorta measures 3.8 cm, distal transverse aorta measures 2.6 cm, proximal descending thoracic aorta measures 2.4 cm, and distal descending thoracic aorta measures 2.0 cm just above the hiatus, all not significantly changed when measured at a comparable location on the prior study. Prosthetic aortic valve. No central pulmonary embolus on this  nondedicated study. LAD and left circumflex coronary artery calcification. No pericardial effusion. Mediastinum/Nodes: No enlarged axillary, mediastinal, or hilar lymph nodes. Unremarkable thyroid, trachea, and esophagus. Lungs/Pleura: No pleural effusion or pneumothorax. 5 mm medial left upper lobe nodule has not significantly changed in size allowing for differences in slice selection (series 5, image 58). No new nodules. Minimal right middle lobe scarring. Upper Abdomen: Unchanged 2.3 cm left upper pole renal cyst. Musculoskeletal: Bilateral gynecomastia. No acute osseous abnormality or suspicious osseous lesion. Review of the MIP images confirms the above findings. IMPRESSION: Unchanged 4.8 cm ascending aortic aneurysm. Electronically Signed   By: Sebastian Ache M.D.   On: 12/28/2015 12:20   Ct Temporal Bones Wo Contrast  Result Date: 12/18/2015 CLINICAL DATA:  RIGHT greater than LEFT chronic tinnitus, worsening for 4 months. History of multiple treated ear infections. EXAM: CT TEMPORAL BONES WITHOUT CONTRAST TECHNIQUE: Axial and coronal plane CT imaging of the petrous temporal bones was performed with thin-collimation image reconstruction. No intravenous contrast was administered. Multiplanar CT image reconstructions were also generated. COMPARISON:  None. FINDINGS: RIGHT: External auditory canal is well formed and well aerated. Tympanic membrane is not thickened or retracted. The scutum is sharp. Well aerated middle ear including Prussak's space. Ossicles are well formed and located. Patent aditus ad antrum. Well aerated mastoid air cells without coalescence. Intact tegmen tympani. Intact otic capsule with normal appearance of the inner ear structures. No internal auditory canal expansion. No definite cerebellar pontine angle masses. Mild RIGHT temporomandibular osteoarthrosis, pneumatized pedis apices with bony dehiscence RIGHT glenoid. LEFT: External auditory canal is well formed and aerated. Tympanic  membrane is not thickened or retracted. The scutum is sharp. Well aerated middle ear including Prussak's space. Ossicles are well formed and located. Patent aditus ad antrum. Well aerated mastoid air cells without coalescence. Intact tegmen tympani. Intact otic capsule with normal appearance of the inner ear structures. No internal auditory canal expansion. No definite cerebellar pontine angle masses. Mild calcific atherosclerosis the carotid siphons. IMPRESSION: Bony dehiscence RIGHT temporomandibular joint/glenoid fossa. Otherwise negative bilateral temporal bone CT. Electronically Signed   By: Awilda Metro M.D.   On: 12/18/2015 16:57   Ct Angio Chest Aorta W/cm &/or Wo/cm  10/28/2013   CLINICAL DATA:  Ascending thoracic aortic aneurysm, followup, personal history of cardiac valve replacement, cardiac surgery, quit smoking 30 years ago  EXAM: CT ANGIOGRAPHY CHEST WITH CONTRAST  TECHNIQUE: Multidetector CT imaging of the chest was performed using the standard protocol during bolus administration of intravenous contrast. Multiplanar CT image reconstructions and MIPs were obtained to evaluate the vascular anatomy.  CONTRAST:  16mL OMNIPAQUE IOHEXOL 350 MG/ML SOLN  COMPARISON:  10/08/2012, 09/12/2011  FINDINGS: Replacement aortic valve noted. Aortic root is dilated to 4.4 cm transverse diameter as it was previously. Mid ascending aorta is distended to 4.8 cm, unchanged. Proximal aortic arch is 3.8 cm, not significantly different from prior study. Distal aortic arch is 2.8 cm, stable. Proximal descending aorta is 2.6 cm, stable. Distal descending aorta is 2.1 cm, stable. No evidence of dissection or stenosis. The origins of the great vessels all appear normal. No significant calcified atherosclerotic plaque identified.  There is calcification of the left anterior descending artery. There is no significant pleural or pericardial effusion. Patient is status post previous median sternotomy. No significant hilar  or mediastinal adenopathy.  4 mm pulmonary nodule image number 20 in the medial lingula is stable from 09/12/2011 and therefore considered benign. Stable cyst upper pole left kidney. No acute musculoskeletal findings.  Review of the MIP images confirms the above findings.  IMPRESSION: No change in ascending aortic aneurysm to a maximal diameter of 4.8 cm.   Electronically Signed   By: Esperanza Heiraymond  Rubner M.D.   On: 10/28/2013 09:30   Ct Angio Chest Aorta W/cm &/or Wo/cm  10/08/2012   CLINICAL DATA:  Thoracic aortic aneurysm, aortic stenosis, post mechanical prosthesis placement.  EXAM: CT ANGIOGRAPHY OF CHEST  TECHNIQUE: Multidetector CT imaging of the chest was performed during bolus injection of intravenous contrast. Multiplanar CT angiographic image reconstructions including MIPs were also generated to evaluate the vascular anatomy.  CONTRAST:  125mL OMNIPAQUE IOHEXOL 350 MG/ML SOLN  COMPARISON:  09/12/2011  FINDINGS: Stable appearance of prosthetic aortic valve. Postop change of the aortic root which is ectatic measuring 4.4 cm maximum transverse diameter. Proximal ascending 4.8 cm, distal ascending 4.7 cm, proximal arch 3.7 cm, distal arch 2.8 cm, proximal descending 2.6 cm, and distal descending 2.1 cm just above the diaphragm. Negative for dissection or stenosis. No significant atheromatous irregularity. 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 There is fairly good contrast opacification of the pulmonary arterial tree; exam was not optimized for detection of pulmonary emboli.  Previous median sternotomy. No pleural or pericardial effusion. Patchy coronary calcifications. No hilar or mediastinal adenopathy. Lungs are clear. Stable cyst In the upper pole left kidney. Remainder visualized upper abdomen unremarkable. thoracic spine intact.  IMPRESSION: IMPRESSION  1. Stable 4.8 cm ascending aortic aneurysm without dissection or other complicating features   Electronically  Signed   By: Oley Balmaniel  Hassell M.D.   On: 10/08/2012 14:37    Recent Lab Findings: Lab Results  Component Value Date   WBC 13.1 (H) 12/15/2017   HGB 14.5 12/15/2017   HCT 42.7 12/15/2017   PLT 282.0 12/15/2017   GLUCOSE 94 12/15/2017   CHOL 146 12/15/2017   TRIG 82.0 12/15/2017   HDL 43.10 12/15/2017   LDLCALC 86 12/15/2017   ALT 11 12/15/2017   AST 16 12/15/2017   NA 140 12/15/2017   K 3.6 12/15/2017   CL 102 12/15/2017   CREATININE 0.87 12/15/2017   BUN 10 12/15/2017   CO2 30 12/15/2017   TSH 1.53 12/15/2017   INR 2.4 02/13/2018   Aortic Size Index=     4.8  /Body surface area is 1.87 meters squared. =2.56  < 2.75 cm/m2      4% risk per year 2.75 to 4.25          8% risk per year > 4.25 cm/m2    20% risk per year    Assessment / Plan:   1/ No change in 4.8  since 2011 cm ascending aortic dilatation in patient with previous mechanic aortic valve replacement. There's been no appreciable change in the size of the aorta since his scan in 2011. I discussed the diagnosis with the patient and we have decided to continue to monitor the size of his aorta and will obtain a followup CT scan in one year.   2/ unchanged  6 mm in greatest diameter lingular nodule  lung ,  currently and unchanged from last year ,  measured 3 mm in 2011 and 2012.   Patient was warned about not using Cipro and similar antibiotics. Recent studies have raised concern that fluoroquinolone antibiotics could be associated with an increased risk of aortic aneurysm Fluoroquinolones have non-antimicrobial properties that might jeopardise the integrity of the extracellular matrix of the vascular wall In a  propensity score matched cohort study in [place], there was a 66% increased rate of aortic aneurysm or dissection associated with oral fluoroquinolone use, compared with amoxicillin use, within a 60 day risk period from start of treatment  Plan follow-up CTA of the chest 1 year  Delight Ovensdward B Yannet Rincon MD  Beeper  [phone] Office [phone] 02/26/2018 2:24 PM

## 2018-03-04 DIAGNOSIS — M722 Plantar fascial fibromatosis: Secondary | ICD-10-CM | POA: Diagnosis not present

## 2018-03-04 DIAGNOSIS — M7731 Calcaneal spur, right foot: Secondary | ICD-10-CM | POA: Diagnosis not present

## 2018-03-04 DIAGNOSIS — M71571 Other bursitis, not elsewhere classified, right ankle and foot: Secondary | ICD-10-CM | POA: Diagnosis not present

## 2018-03-11 DIAGNOSIS — M71571 Other bursitis, not elsewhere classified, right ankle and foot: Secondary | ICD-10-CM | POA: Diagnosis not present

## 2018-03-11 DIAGNOSIS — M722 Plantar fascial fibromatosis: Secondary | ICD-10-CM | POA: Diagnosis not present

## 2018-03-18 ENCOUNTER — Ambulatory Visit (INDEPENDENT_AMBULATORY_CARE_PROVIDER_SITE_OTHER): Payer: BLUE CROSS/BLUE SHIELD | Admitting: *Deleted

## 2018-03-18 ENCOUNTER — Other Ambulatory Visit: Payer: BLUE CROSS/BLUE SHIELD

## 2018-03-18 ENCOUNTER — Encounter: Payer: Self-pay | Admitting: *Deleted

## 2018-03-18 ENCOUNTER — Other Ambulatory Visit: Payer: Self-pay

## 2018-03-18 DIAGNOSIS — Z23 Encounter for immunization: Secondary | ICD-10-CM

## 2018-03-18 NOTE — Progress Notes (Signed)
I have reviewed and agree with note, evaluation, plan.   Stephen Hunter, MD  

## 2018-03-18 NOTE — Progress Notes (Signed)
Per orders of Dr. Durene Cal, injection of Shingrix 0.5 ml IM given by Corky Mull, LPN in Right deltoid. Patient tolerated injection well. Patient has completed series.

## 2018-03-26 ENCOUNTER — Telehealth: Payer: Self-pay

## 2018-03-26 NOTE — Telephone Encounter (Signed)
Attempted to call the pt but no answer/busy for covid prescreen

## 2018-03-27 ENCOUNTER — Other Ambulatory Visit: Payer: Self-pay

## 2018-03-27 ENCOUNTER — Ambulatory Visit (INDEPENDENT_AMBULATORY_CARE_PROVIDER_SITE_OTHER): Payer: BLUE CROSS/BLUE SHIELD | Admitting: *Deleted

## 2018-03-27 DIAGNOSIS — Z7901 Long term (current) use of anticoagulants: Secondary | ICD-10-CM | POA: Diagnosis not present

## 2018-03-27 DIAGNOSIS — Z952 Presence of prosthetic heart valve: Secondary | ICD-10-CM

## 2018-03-27 DIAGNOSIS — Z5181 Encounter for therapeutic drug level monitoring: Secondary | ICD-10-CM | POA: Diagnosis not present

## 2018-03-27 DIAGNOSIS — I359 Nonrheumatic aortic valve disorder, unspecified: Secondary | ICD-10-CM

## 2018-03-27 LAB — POCT INR: INR: 2.5 (ref 2.0–3.0)

## 2018-03-27 NOTE — Patient Instructions (Signed)
Description   Continue on same dosage 1/2 tablet (2.5mg ) daily except 1 tablet (5mg ) on Mondays, Wednesdays, and Fridays  Recheck INR in 6-8 weeks. Call us with any medications Changes or concerns # [phone]

## 2018-05-21 ENCOUNTER — Telehealth: Payer: Self-pay

## 2018-05-21 NOTE — Telephone Encounter (Signed)

## 2018-05-21 NOTE — Telephone Encounter (Signed)
lmom for prescreen/drive thru aware 

## 2018-05-22 ENCOUNTER — Other Ambulatory Visit: Payer: Self-pay

## 2018-05-22 ENCOUNTER — Ambulatory Visit (INDEPENDENT_AMBULATORY_CARE_PROVIDER_SITE_OTHER): Payer: BLUE CROSS/BLUE SHIELD | Admitting: *Deleted

## 2018-05-22 DIAGNOSIS — Z952 Presence of prosthetic heart valve: Secondary | ICD-10-CM

## 2018-05-22 DIAGNOSIS — Z5181 Encounter for therapeutic drug level monitoring: Secondary | ICD-10-CM

## 2018-05-22 DIAGNOSIS — Z7901 Long term (current) use of anticoagulants: Secondary | ICD-10-CM

## 2018-05-22 DIAGNOSIS — I359 Nonrheumatic aortic valve disorder, unspecified: Secondary | ICD-10-CM

## 2018-05-22 LAB — POCT INR: INR: 2.3 (ref 2.0–3.0)

## 2018-05-22 NOTE — Patient Instructions (Signed)
Description   Spoke with pt and instructed pt to take 1.5 tablets today then continue on same dosage 1/2 tablet (2.5mg ) daily except 1 tablet (5mg ) on Mondays, Wednesdays, and Fridays  Recheck INR in 6 weeks. Call us with any medications Changes or concerns # [phone]

## 2018-06-10 ENCOUNTER — Other Ambulatory Visit: Payer: Self-pay | Admitting: Cardiology

## 2018-06-29 ENCOUNTER — Telehealth: Payer: Self-pay

## 2018-06-29 NOTE — Telephone Encounter (Signed)

## 2018-07-03 ENCOUNTER — Ambulatory Visit (INDEPENDENT_AMBULATORY_CARE_PROVIDER_SITE_OTHER): Payer: BC Managed Care – PPO | Admitting: Pharmacist

## 2018-07-03 ENCOUNTER — Other Ambulatory Visit: Payer: Self-pay

## 2018-07-03 DIAGNOSIS — Z952 Presence of prosthetic heart valve: Secondary | ICD-10-CM

## 2018-07-03 DIAGNOSIS — Z5181 Encounter for therapeutic drug level monitoring: Secondary | ICD-10-CM

## 2018-07-03 DIAGNOSIS — I359 Nonrheumatic aortic valve disorder, unspecified: Secondary | ICD-10-CM | POA: Diagnosis not present

## 2018-07-03 DIAGNOSIS — Z7901 Long term (current) use of anticoagulants: Secondary | ICD-10-CM | POA: Diagnosis not present

## 2018-07-03 LAB — POCT INR: INR: 2.4 (ref 2.0–3.0)

## 2018-07-03 NOTE — Patient Instructions (Signed)
Spoke with pt and instructed pt to take 1.5 tablets today then start taking 1/2 tablet (2.5mg ) daily except 1 tablet (5mg ) on Sunday, Mondays, Wednesdays, and Fridays  Recheck INR in 3 weeks. Call us with any medications Changes or concerns # [phone]

## 2018-07-27 ENCOUNTER — Telehealth: Payer: Self-pay

## 2018-07-27 NOTE — Telephone Encounter (Signed)

## 2018-07-27 NOTE — Telephone Encounter (Signed)
lmom for prescreen  

## 2018-07-29 ENCOUNTER — Other Ambulatory Visit: Payer: Self-pay

## 2018-07-29 ENCOUNTER — Ambulatory Visit (INDEPENDENT_AMBULATORY_CARE_PROVIDER_SITE_OTHER): Payer: BC Managed Care – PPO | Admitting: *Deleted

## 2018-07-29 DIAGNOSIS — Z5181 Encounter for therapeutic drug level monitoring: Secondary | ICD-10-CM

## 2018-07-29 DIAGNOSIS — Z952 Presence of prosthetic heart valve: Secondary | ICD-10-CM | POA: Diagnosis not present

## 2018-07-29 DIAGNOSIS — I359 Nonrheumatic aortic valve disorder, unspecified: Secondary | ICD-10-CM | POA: Diagnosis not present

## 2018-07-29 DIAGNOSIS — Z7901 Long term (current) use of anticoagulants: Secondary | ICD-10-CM

## 2018-07-29 LAB — POCT INR: INR: 3.6 — AB (ref 2.0–3.0)

## 2018-07-29 NOTE — Patient Instructions (Addendum)
Description   Do not take any Coumadin tomorrow then continue taking 1 tablet daily except 1/2 tablet (2.5mg ) Tuesday, Thursday, and Saturday.  Recheck INR in 3 weeks. Call us with any medications Changes or concerns # [phone]

## 2018-08-19 ENCOUNTER — Ambulatory Visit (INDEPENDENT_AMBULATORY_CARE_PROVIDER_SITE_OTHER): Payer: BC Managed Care – PPO

## 2018-08-19 ENCOUNTER — Other Ambulatory Visit: Payer: Self-pay

## 2018-08-19 DIAGNOSIS — Z952 Presence of prosthetic heart valve: Secondary | ICD-10-CM | POA: Diagnosis not present

## 2018-08-19 DIAGNOSIS — Z5181 Encounter for therapeutic drug level monitoring: Secondary | ICD-10-CM

## 2018-08-19 DIAGNOSIS — I359 Nonrheumatic aortic valve disorder, unspecified: Secondary | ICD-10-CM

## 2018-08-19 DIAGNOSIS — Z7901 Long term (current) use of anticoagulants: Secondary | ICD-10-CM | POA: Diagnosis not present

## 2018-08-19 LAB — POCT INR: INR: 4 — AB (ref 2.0–3.0)

## 2018-08-19 NOTE — Patient Instructions (Signed)
Description   Skip today's dosage of Coumadin, then start taking 1/2 tablet daily except 1 tablet on Mondays, Wednesdays and Fridays.  Recheck INR in 3 weeks. Call us with any medications Changes or concerns # [phone]

## 2018-09-09 ENCOUNTER — Other Ambulatory Visit: Payer: Self-pay

## 2018-09-09 ENCOUNTER — Ambulatory Visit (INDEPENDENT_AMBULATORY_CARE_PROVIDER_SITE_OTHER): Payer: BC Managed Care – PPO | Admitting: Pharmacist

## 2018-09-09 DIAGNOSIS — Z5181 Encounter for therapeutic drug level monitoring: Secondary | ICD-10-CM

## 2018-09-09 DIAGNOSIS — Z952 Presence of prosthetic heart valve: Secondary | ICD-10-CM

## 2018-09-09 DIAGNOSIS — I359 Nonrheumatic aortic valve disorder, unspecified: Secondary | ICD-10-CM

## 2018-09-09 DIAGNOSIS — Z7901 Long term (current) use of anticoagulants: Secondary | ICD-10-CM

## 2018-09-09 LAB — POCT INR: INR: 2.9 (ref 2.0–3.0)

## 2018-09-09 NOTE — Patient Instructions (Addendum)
Description   Continue taking 1/2 tablet daily except 1 tablet on Mondays, Wednesdays and Fridays.  Recheck INR in 4 weeks. Call us with any medications Changes or concerns # [phone]

## 2018-10-05 NOTE — Progress Notes (Signed)
Cardiology Office Note    Date:  10/06/2018   ID:  Ryan Lamb, DOB Sep 06, 1955, MRN [id number]  PCP:  Marin Olp, MD  Cardiologist: Ena Dawley, MD  EPS: None  Chief Complaint  Patient presents with  . Follow-up    History of Present Illness:  Ryan Lamb is a 63 y.o. male with history of aortic valvuloplasty 1971, [employer] AVR 1994 on Coumadin, 4.8 cm ascending aortic aneurysm 02/2018 on CT, Also noted 6 mm LUL nodule, stable plaque in LAD/ Also has HLD. lexiscan 2017 borderline but a lot of artifact and asymptomatic so no further testing.  Last f/u with Lyda Jester, PA 09/2017 doing well.  Patient comes in for yearly f/u. Denies chest pain, dyspnea, dyspnea on exertion, dizziness or presyncope. Walking 1-2 miles 5-6 days a week.   Past Medical History:  Diagnosis Date  . Aortic stenosis   . Aortic valve prosthesis present   . Dissecting aortic aneurysm (any part), thoracic ([place])   . Diverticulosis   . Gastric ulcer    due to nsaids in past  . Hypercholesterolemia   . Mechanical heart valve present     Past Surgical History:  Procedure Laterality Date  . CARDIAC VALVE REPLACEMENT    . EEC     with propofol   . ESOPHAGOGASTRODUODENOSCOPY  01/2010, 04/2010  . ESOPHAGOGASTRODUODENOSCOPY     cauterize after nsaid related ulcer  . EXPLORATION POST OPERATIVE OPEN HEART  1994, 1972    Current Medications: Current Meds  Medication Sig  . aspirin EC 81 MG tablet Take 81 mg by mouth daily as needed.  Marland Kitchen atorvastatin (LIPITOR) 20 MG tablet TAKE 1 TABLET BY MOUTH AT 6 PM.  . MULTIPLE VITAMIN PO Take 1 tablet by mouth daily.   . pantoprazole (PROTONIX) 20 MG tablet Take 20 mg by mouth daily as needed for heartburn or indigestion.  Marland Kitchen warfarin (COUMADIN) 5 MG tablet TAKE 1/2 TO 1 TABLET BY MOUTH DAILY AS DIRECTED BY COUMADIN CLINIC  . [DISCONTINUED] atorvastatin (LIPITOR) 20 MG tablet TAKE 1 TABLET BY MOUTH AT 6 PM. PLEASE MAKE  APPOINTMENT FOR MORE REFILLS     Allergies:   Quinolones and Penicillins   Social History   Socioeconomic History  . Marital status: Single    Spouse name: Not on file  . Number of children: Not on file  . Years of education: Not on file  . Highest education level: Not on file  Occupational History  . Not on file  Social Needs  . Financial resource strain: Not on file  . Food insecurity    Worry: Not on file    Inability: Not on file  . Transportation needs    Medical: Not on file    Non-medical: Not on file  Tobacco Use  . Smoking status: Former Smoker    Packs/day: 0.50    Years: 5.00    Pack years: 2.50    Quit date: 10/17/1977    Years since quitting: 40.9  . Smokeless tobacco: Never Used  Substance and Sexual Activity  . Alcohol use: No  . Drug use: No  . Sexual activity: Not on file  Lifestyle  . Physical activity    Days per week: Not on file    Minutes per session: Not on file  . Stress: Not on file  Relationships  . Social Herbalist on phone: Not on file    Gets together: Not on file  Attends religious service: Not on file    Active member of club or organization: Not on file    Attends meetings of clubs or organizations: Not on file    Relationship status: Not on file  Other Topics Concern  . Not on file  Social History Narrative   Single. Lives with parents right now.       Undergrad at [employer], Masters oral roberts- divinity. Hard to get into ministry.    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   ROS:   Please see the history of present illness.    ROS All other systems reviewed and are negative.   PHYSICAL EXAM:   VS:  BP 112/78    Pulse 78   Ht 5\' 9"  (1.753 m)   Wt 162 lb (73.5 kg)   SpO2 97%   BMI 23.92 kg/m   Physical Exam  GEN: Well nourished, well developed, in no acute distress  Neck: no JVD, carotid bruits, or masses Cardiac:RRR; crisp valvular clicks 1- 2/6 systolic murmur LSB Respiratory:  clear to auscultation bilaterally, normal work of breathing GI: soft, nontender, nondistended, + BS Ext: without cyanosis, clubbing, or edema, Good distal pulses bilaterally Neuro:  Alert and Oriented x 3, Psych: euthymic mood, full affect  Wt Readings from Last 3 Encounters:  10/06/18 162 lb (73.5 kg)  02/26/18 158 lb 6.4 oz (71.8 kg)  12/15/17 157 lb 3.2 oz (71.3 kg)      Studies/Labs Reviewed:   EKG:  EKG is  ordered today.  The ekg ordered today demonstrates NSR normal EKG   Recent Labs: 12/15/2017: ALT 11; BUN 10; Creatinine, Ser 0.87; Hemoglobin 14.5; Platelets 282.0; Potassium 3.6; Sodium 140; TSH 1.53   Lipid Panel    Component Value Date/Time   CHOL 146 12/15/2017 0915   TRIG 82.0 12/15/2017 0915   HDL 43.10 12/15/2017 0915   CHOLHDL 3 12/15/2017 0915   VLDL 16.4 12/15/2017 0915   LDLCALC 86 12/15/2017 0915    Additional studies/ records that were reviewed today include:  Echo 2017Study Conclusions   - Left ventricle: The cavity size was normal. Wall thickness was   normal. Systolic function was normal. The estimated ejection   fraction was in the range of 55% to 60%. Features are consistent   with a pseudonormal left ventricular filling pattern, with   concomitant abnormal relaxation and increased filling pressure   (grade 2 diastolic dysfunction). - Aortic valve: Mechanical aortic valve. No significant prosthetic   valve stenosis. There was no significant regurgitation. Mean   gradient (S): 12 mm Hg. - Aorta: Mildly dilated aortic root. Aortic root dimension: 37 mm   (ED). - Right ventricle: The cavity size was normal. Systolic function   was normal. - Tricuspid valve: Peak RV-RA  gradient (S): 20 mm Hg. - Pulmonary arteries: PA peak pressure: 23 mm Hg (S). - Inferior vena cava: The vessel was normal in size. The   respirophasic diameter changes were in the normal range (= 50%),   consistent with normal central venous pressure.   Impressions:   - Normal LV size with EF 55-60%. Moderate diastolic dysfunction.   Normal RV size and systolic  function. Mechanical aortic valve   appears to function normally.  Lexiscan 2017   The left ventricular ejection fraction is normal (55-65%).  Nuclear stress EF: 63%.  No T wave inversion was noted during stress.  There was no ST segment deviation noted during stress.  Defect 1: There is a medium defect of moderate severity.   Moderate size and intensity, reversible inferolateral perfusion defect. There is underlying bowel attenuation, which could cause a degree of artifact, however, the SDS is 4 and significant ischemia cannot be excluded. LVEF 63% with normal wall motion. Clinical correlation is advised.      ASSESSMENT:    1. Hx of mechanical aortic valve replacement   2. Ascending aortic aneurysm (HCC)   3. Hypercholesterolemia      PLAN:  In order of problems listed above:  Mechanical AVR 1994 on Coumadin last echo stable valve 2017-no new symptoms. Doing well. F/U with Dr. Delton See 1 yr.  Ascending Aortic aneurysm 4.8 cm on CTA 02/2018 followed by Dr.Gerhardt  HLD-LDL 86 12/2017 followed by Dr. Nolberto Hanlon recheck in December.     Medication Adjustments/Labs and Tests Ordered: Current medicines are reviewed at length with the patient today.  Concerns regarding medicines are outlined above.  Medication changes, Labs and Tests ordered today are listed in the Patient Instructions below. There are no Patient Instructions on file for this visit.   Elson Clan, PA-C  10/06/2018 10:53 AM    [facility] [address] [place], [place], [place]  [address] Phone: [phone]; Fax: [phone]

## 2018-10-06 ENCOUNTER — Encounter: Payer: Self-pay | Admitting: Physician Assistant

## 2018-10-06 ENCOUNTER — Ambulatory Visit: Payer: BC Managed Care – PPO | Admitting: Physician Assistant

## 2018-10-06 ENCOUNTER — Ambulatory Visit (INDEPENDENT_AMBULATORY_CARE_PROVIDER_SITE_OTHER): Payer: BC Managed Care – PPO | Admitting: *Deleted

## 2018-10-06 ENCOUNTER — Other Ambulatory Visit: Payer: Self-pay

## 2018-10-06 VITALS — BP 112/78 | HR 78 | Ht 69.0 in | Wt 162.0 lb

## 2018-10-06 DIAGNOSIS — I359 Nonrheumatic aortic valve disorder, unspecified: Secondary | ICD-10-CM

## 2018-10-06 DIAGNOSIS — Z7901 Long term (current) use of anticoagulants: Secondary | ICD-10-CM | POA: Diagnosis not present

## 2018-10-06 DIAGNOSIS — E7800 Pure hypercholesterolemia, unspecified: Secondary | ICD-10-CM

## 2018-10-06 DIAGNOSIS — I7121 Aneurysm of the ascending aorta, without rupture: Secondary | ICD-10-CM

## 2018-10-06 DIAGNOSIS — Z952 Presence of prosthetic heart valve: Secondary | ICD-10-CM | POA: Diagnosis not present

## 2018-10-06 DIAGNOSIS — I712 Thoracic aortic aneurysm, without rupture: Secondary | ICD-10-CM

## 2018-10-06 LAB — POCT INR: INR: 3 (ref 2.0–3.0)

## 2018-10-06 MED ORDER — ATORVASTATIN CALCIUM 20 MG PO TABS: ORAL_TABLET | ORAL | 3 refills | Status: DC

## 2018-10-06 NOTE — Patient Instructions (Signed)
Description   Continue taking 1/2 tablet daily except 1 tablet on Mondays, Wednesdays and Fridays.  Recheck INR in 5 weeks. Call us with any medications Changes or concerns # [phone]

## 2018-10-06 NOTE — Patient Instructions (Signed)
Medication Instructions:  Your physician recommends that you continue on your current medications as directed. Please refer to the Current Medication list given to you today.  If you need a refill on your cardiac medications before your next appointment, please call your pharmacy.   Lab work: None Ordered  If you have labs (blood work) drawn today and your tests are completely normal, you will receive your results only by: . MyChart Message (if you have MyChart) OR . A paper copy in the mail If you have any lab test that is abnormal or we need to change your treatment, we will call you to review the results.  Testing/Procedures: None ordered  Follow-Up: At [facility], you and your health needs are our priority.  As part of our continuing mission to provide you with exceptional heart care, we have created designated Provider Care Teams.  These Care Teams include your primary Cardiologist (physician) and Advanced Practice Providers (APPs -  Physician Assistants and Nurse Practitioners) who all work together to provide you with the care you need, when you need it. . You will need a follow up appointment in 1 year.  Please call our office 2 months in advance to schedule this appointment.  You may see Katarina Nelson, MD or one of the following Advanced Practice Providers on your designated Care Team:   . Brittainy Simmons, PA-C . Dayna Dunn, PA-C . Michele Lenze, PA-C  Any Other Special Instructions Will Be Listed Below (If Applicable).    

## 2018-10-24 ENCOUNTER — Other Ambulatory Visit: Payer: Self-pay | Admitting: Cardiology

## 2018-11-10 ENCOUNTER — Ambulatory Visit (INDEPENDENT_AMBULATORY_CARE_PROVIDER_SITE_OTHER): Payer: BC Managed Care – PPO | Admitting: *Deleted

## 2018-11-10 ENCOUNTER — Other Ambulatory Visit: Payer: Self-pay

## 2018-11-10 DIAGNOSIS — I359 Nonrheumatic aortic valve disorder, unspecified: Secondary | ICD-10-CM | POA: Diagnosis not present

## 2018-11-10 DIAGNOSIS — Z7901 Long term (current) use of anticoagulants: Secondary | ICD-10-CM | POA: Diagnosis not present

## 2018-11-10 DIAGNOSIS — Z952 Presence of prosthetic heart valve: Secondary | ICD-10-CM | POA: Diagnosis not present

## 2018-11-10 LAB — POCT INR: INR: 2.7 (ref 2.0–3.0)

## 2018-11-10 NOTE — Patient Instructions (Signed)
Description   Continue taking 1/2 tablet daily except 1 tablet on Mondays, Wednesdays and Fridays.  Recheck INR in 6 weeks. Call us with any medications Changes or concerns # [phone]

## 2018-12-07 ENCOUNTER — Other Ambulatory Visit: Payer: Self-pay | Admitting: Cardiology

## 2018-12-16 ENCOUNTER — Other Ambulatory Visit: Payer: Self-pay

## 2018-12-17 ENCOUNTER — Ambulatory Visit (INDEPENDENT_AMBULATORY_CARE_PROVIDER_SITE_OTHER): Payer: BC Managed Care – PPO | Admitting: Family Medicine

## 2018-12-17 ENCOUNTER — Encounter: Payer: Self-pay | Admitting: Family Medicine

## 2018-12-17 VITALS — BP 118/82 | HR 83 | Temp 97.8°F | Ht 69.5 in | Wt 161.0 lb

## 2018-12-17 DIAGNOSIS — E7800 Pure hypercholesterolemia, unspecified: Secondary | ICD-10-CM | POA: Diagnosis not present

## 2018-12-17 DIAGNOSIS — K219 Gastro-esophageal reflux disease without esophagitis: Secondary | ICD-10-CM

## 2018-12-17 DIAGNOSIS — Z952 Presence of prosthetic heart valve: Secondary | ICD-10-CM

## 2018-12-17 DIAGNOSIS — Z125 Encounter for screening for malignant neoplasm of prostate: Secondary | ICD-10-CM | POA: Diagnosis not present

## 2018-12-17 DIAGNOSIS — I712 Thoracic aortic aneurysm, without rupture: Secondary | ICD-10-CM

## 2018-12-17 DIAGNOSIS — Z860101 Personal history of adenomatous and serrated colon polyps: Secondary | ICD-10-CM

## 2018-12-17 DIAGNOSIS — Z86010 Personal history of colonic polyps: Secondary | ICD-10-CM

## 2018-12-17 DIAGNOSIS — I7121 Aneurysm of the ascending aorta, without rupture: Secondary | ICD-10-CM

## 2018-12-17 DIAGNOSIS — Z87891 Personal history of nicotine dependence: Secondary | ICD-10-CM

## 2018-12-17 DIAGNOSIS — Z0000 Encounter for general adult medical examination without abnormal findings: Secondary | ICD-10-CM

## 2018-12-17 LAB — POC URINALSYSI DIPSTICK (AUTOMATED)
Bilirubin, UA: NEGATIVE
Blood, UA: NEGATIVE
Glucose, UA: NEGATIVE
Ketones, UA: NEGATIVE
Leukocytes, UA: NEGATIVE
Nitrite, UA: NEGATIVE
Protein, UA: NEGATIVE
Spec Grav, UA: 1.025 (ref 1.010–1.025)
Urobilinogen, UA: 0.2 E.U./dL
pH, UA: 5.5 (ref 5.0–8.0)

## 2018-12-17 LAB — COMPREHENSIVE METABOLIC PANEL
ALT: 25 U/L (ref 0–53)
AST: 25 U/L (ref 0–37)
Albumin: 4.4 g/dL (ref 3.5–5.2)
Alkaline Phosphatase: 61 U/L (ref 39–117)
BUN: 12 mg/dL (ref 6–23)
CO2: 28 mEq/L (ref 19–32)
Calcium: 9.2 mg/dL (ref 8.4–10.5)
Chloride: 105 mEq/L (ref 96–112)
Creatinine, Ser: 0.94 mg/dL (ref 0.40–1.50)
GFR: 81.26 mL/min (ref >60.00)
Glucose, Bld: 83 mg/dL (ref 70–99)
Potassium: 3.9 mEq/L (ref 3.5–5.1)
Sodium: 141 mEq/L (ref 135–145)
Total Bilirubin: 0.8 mg/dL (ref 0.2–1.2)
Total Protein: 6.6 g/dL (ref 6.0–8.3)

## 2018-12-17 LAB — CBC WITH DIFFERENTIAL/PLATELET
Basophils Absolute: 0.1 10*3/uL (ref 0.0–0.1)
Basophils Relative: 0.9 % (ref 0.0–3.0)
Eosinophils Absolute: 0.2 10*3/uL (ref 0.0–0.7)
Eosinophils Relative: 3.5 % (ref 0.0–5.0)
HCT: 44.9 % (ref 39.0–52.0)
Hemoglobin: 15.4 g/dL (ref 13.0–17.0)
Lymphocytes Relative: 24.4 % (ref 12.0–46.0)
Lymphs Abs: 1.6 10*3/uL (ref 0.7–4.0)
MCHC: 34.4 g/dL (ref 30.0–36.0)
MCV: 90.3 fl (ref 78.0–100.0)
Monocytes Absolute: 0.4 10*3/uL (ref 0.1–1.0)
Monocytes Relative: 6.3 % (ref 3.0–12.0)
Neutro Abs: 4.2 10*3/uL (ref 1.4–7.7)
Neutrophils Relative %: 64.9 % (ref 43.0–77.0)
Platelets: 234 10*3/uL (ref 150.0–400.0)
RBC: 4.97 Mil/uL (ref 4.22–5.81)
RDW: 13.4 % (ref 11.5–15.5)
WBC: 6.4 10*3/uL (ref 4.0–10.5)

## 2018-12-17 LAB — PSA: PSA: 1.27 ng/mL (ref 0.10–4.00)

## 2018-12-17 LAB — LIPID PANEL
Cholesterol: 174 mg/dL (ref 0–200)
HDL: 55.2 mg/dL (ref >39.00)
LDL Cholesterol: 101 mg/dL — ABNORMAL HIGH (ref 0–99)
NonHDL: 118.96
Total CHOL/HDL Ratio: 3
Triglycerides: 88 mg/dL (ref 0.0–149.0)
VLDL: 17.6 mg/dL (ref 0.0–40.0)

## 2018-12-17 MED ORDER — PANTOPRAZOLE SODIUM 20 MG PO TBEC: 20.0000 mg | DELAYED_RELEASE_TABLET | Freq: Every day | ORAL | 1 refills | Status: DC | PRN

## 2018-12-17 NOTE — Patient Instructions (Addendum)
Please stop by lab before you go If you do not have mychart- we will call you about results within 5 business days of [place] receiving them.  If you have mychart- we will send your results within 3 business days of [place] receiving them.  If abnormal or we want to clarify a result, we will call or mychart you to make sure you receive the message.  If you have questions or concerns or don't hear within 5-7 days, please send [place] a message or call us.   Great to see you today and hope you have a Merry Christmas!   Recommended follow up: Return in about 1 year (around 12/17/2019) for physical or sooner if needed.

## 2018-12-17 NOTE — Addendum Note (Signed)
Addended by: Tomi Likens on: 12/17/2018 09:16 AM   Modules accepted: Orders

## 2018-12-17 NOTE — Progress Notes (Addendum)
Phone: [phone]   Subjective:  Patient presents today for their annual physical. Chief complaint-noted.   See problem oriented charting- Review of Systems  Constitutional: Negative.   HENT: Negative.   Eyes: Negative.   Respiratory: Negative.   Cardiovascular: Negative.   Gastrointestinal: Negative.   Genitourinary: Negative.   Musculoskeletal: Negative.   Skin: Negative.   Neurological: Negative.   Endo/Heme/Allergies: Bruises/bleeds easily (on coumadin).  Psychiatric/Behavioral: Negative.    The following were reviewed and entered/updated in epic: Past Medical History:  Diagnosis Date  . Aortic stenosis   . Aortic valve prosthesis present   . Dissecting aortic aneurysm (any part), thoracic (HCC)   . Diverticulosis   . Gastric ulcer    due to nsaids in past  . Hypercholesterolemia   . Mechanical heart valve present    Patient Active Problem List   Diagnosis Date Noted  . Ascending aortic aneurysm (HCC) 10/18/2010    Priority: High  . Hx of mechanical aortic valve replacement     Priority: High  . History of adenomatous polyp of colon 12/11/2015    Priority: Medium  . GERD (gastroesophageal reflux disease) 04/22/2011    Priority: Medium  . Hypercholesterolemia     Priority: Medium  . Encounter for therapeutic drug monitoring 12/08/2015    Priority: Low  . Long term (current) use of anticoagulants 05/08/2010    Priority: Low   Past Surgical History:  Procedure Laterality Date  . CARDIAC VALVE REPLACEMENT    . EEC     with propofol   . ESOPHAGOGASTRODUODENOSCOPY  01/2010, 04/2010  . ESOPHAGOGASTRODUODENOSCOPY     cauterize after nsaid related ulcer  . EXPLORATION POST OPERATIVE OPEN HEART  1994, 1972    Family History  Problem Relation Age of Onset  . Coronary artery disease Mother        CABG. 81 in 2019.   Marland Kitchen. Coronary artery disease Father        aortic valve replacement  . Skin cancer Father         [id number] in 2019.   Marland Kitchen. Healthy Sister   . Healthy  Brother        overweight  . Schizophrenia Sister   . GI problems Neg Hx     Medications- reviewed and updated Current Outpatient Medications  Medication Sig Dispense Refill  . aspirin EC 81 MG tablet Take 81 mg by mouth daily as needed.    Marland Kitchen. atorvastatin (LIPITOR) 20 MG tablet TAKE 1 TABLET BY MOUTH AT 6 PM.PLEASE MAKE APPOINTMENT FOR MORE REFILLS 90 tablet 3  . MULTIPLE VITAMIN PO Take 1 tablet by mouth daily.     . pantoprazole (PROTONIX) 20 MG tablet Take 1 tablet (20 mg total) by mouth daily as needed for heartburn or indigestion. 30 tablet 1  . warfarin (COUMADIN) 5 MG tablet TAKE 1/2 TO 1 TABLET BY MOUTH DAILY OR AS DIRECTED BY COUMADIN CLINIC 70 tablet 0   No current facility-administered medications for this visit.    Allergies-reviewed and updated Allergies  Allergen Reactions  . Quinolones     Patient was warned about not using Cipro and similar antibiotics. Recent studies have raised concern that fluoroquinolone antibiotics could be associated with an increased risk of aortic aneurysm Fluoroquinolones have non-antimicrobial properties that might jeopardise the integrity of the extracellular matrix of the vascular wall In a  propensity score matched cohort study in [place], there was a 66% increased rate of aortic aneurysm or dissection associated with oral fluoroquinolone use, compared  wit  . Penicillins Rash    Reaction unknown may have been a rash    Social History   Social History Narrative   Single. Lives with parents right now.       Undergrad at [employer], Masters oral roberts- divinity. Hard to get into ministry.    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 TM normal. Bridge of nose normal. Eyelids normal.  Neck: no thyromegaly or cervical lymphadenopathy  CV: RRR stable mechanical murmur Lungs: CTAB no crackles, wheeze, rhonchi Abdomen: soft/nontender/nondistended/normal bowel sounds. No rebound or guarding.  Ext: no edema Skin: warm, dry Neuro: grossly normal, moves all extremities, PERRLA Rectal: normal tone, normal sized prostate, no masses or tenderness    Assessment and Plan  63 y.o. male presenting for annual physical.  Health Maintenance counseling: 1. Anticipatory guidance: Patient counseled regarding regular dental exams q6 months, eye exams yearly,  avoiding smoking and second hand smoke , limiting alcohol to 2 beverages per day.  Does not drink at all 2. Risk factor reduction:  Advised patient of need for regular exercise and diet rich and fruits and vegetables to reduce risk of heart attack and stroke. Exercise- cardio about 3 times times per week. Diet- cutting back on carbs and advised to eat healthier options.  Wt Readings from Last 3 Encounters:  12/17/18 161 lb (73 kg)  10/06/18 162 lb (73.5 kg)  02/26/18 158 lb 6.4 oz (71.8 kg)  3. Immunizations/screenings/ancillary studies-fully up-to-date  Immunization History  Administered Date(s) Administered  . Influenza Inj Mdck Quad Pf 09/17/2018  . Influenza,inj,Quad PF,6+ Mos 11/21/2014, 08/10/2015, 12/12/2016, 10/04/2017  . Tdap 11/21/2014  . Zoster Recombinat (Shingrix) 12/15/2017, 03/18/2018   4. Prostate cancer screening-last PSA was trending up.  We recommended a follow-up for 3 months to recheck PSA but unfortunately that was at the beginning of COVID-19 pandemic and did not get completed.  We will update PSA today.  Also update rectal exam today- reassuring  Lab Results  Component Value Date   PSA 1.85 12/15/2017   PSA 1.26 02/17/2017   PSA 1.45 12/12/2016   5. Colon cancer  screening - Due on 08/15/2020.History of adenomatous polyp of colon  6. Skin cancer screening- saw dermatology 3 years ago for eczema on hands. advised regular sunscreen use. Denies worrisome, changing, or new skin lesions.  7. Former smoker- 2.5 pack years and quit in 1970s. At age 33 we will get AAA screening. Already follows for aortic ascending aneurysm with Dr. Servando Snare  Status of chronic or acute concerns   Ascending aortic aneurysm (HCC)-followed by Dr. Servando Snare with last CTA February 2020 at 4.8 cm.   Hx of mechanical aortic valve replacement-follows closely with cardiology.  Mechanical aortic valve replacement in 1994.  Remains on Coumadin long-term.Primary cardiologist Dr. Meda Coffee  Hypercholesterolemia-on atorvastatin 20 mg.  We will update lipid panel today.  We discussed newer guidelines pushing for LDL under 70 Lab Results  Component Value Date   CHOL 146 12/15/2017   HDL 43.10 12/15/2017  LDLCALC 86 12/15/2017   TRIG 82.0 12/15/2017   CHOLHDL 3 12/15/2017   Gastroesophageal reflux disease-patient is on pantoprazole - refilled today for sparing use  Leukocytosis last year-recommended 33-month repeat but got delayed due to COVID-19   Did get some injections in his right foot with cortisone and found this helpful-   No recent ear issues- has not had to see Dr. 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 Future Appointments  Date Time Provider Department Center  12/22/2018  8:15 AM [id number]   Lab/Order associations: fasting   ICD-10-CM   1. Preventative health care  Z00.00 CBC with Differential/Platelet    Comprehensive metabolic panel    Lipid panel    PSA    POCT Urinalysis Dipstick (Automated)  2. Ascending aortic aneurysm (HCC)  I71.2   3. Hx of mechanical aortic valve replacement  Z95.2   4.  Hypercholesterolemia  E78.00 CBC with Differential/Platelet    Comprehensive metabolic panel    Lipid panel  5. Gastroesophageal reflux disease, unspecified whether esophagitis present  K21.9   6. History of adenomatous polyp of colon  Z86.010   7. Screening for prostate cancer  Z12.5 PSA  8. Former smoker  Z87.891 POCT Urinalysis Dipstick (Automated)    Meds ordered this encounter  Medications  . pantoprazole (PROTONIX) 20 MG tablet    Sig: Take 1 tablet (20 mg total) by mouth daily as needed for heartburn or indigestion.    Dispense:  30 tablet    Refill:  1    Return precautions advised.  Tana Conch, MD

## 2018-12-22 ENCOUNTER — Ambulatory Visit (INDEPENDENT_AMBULATORY_CARE_PROVIDER_SITE_OTHER): Payer: BC Managed Care – PPO | Admitting: *Deleted

## 2018-12-22 ENCOUNTER — Other Ambulatory Visit: Payer: Self-pay

## 2018-12-22 DIAGNOSIS — Z7901 Long term (current) use of anticoagulants: Secondary | ICD-10-CM

## 2018-12-22 DIAGNOSIS — Z952 Presence of prosthetic heart valve: Secondary | ICD-10-CM

## 2018-12-22 DIAGNOSIS — I359 Nonrheumatic aortic valve disorder, unspecified: Secondary | ICD-10-CM

## 2018-12-22 LAB — POCT INR: INR: 2.6 (ref 2.0–3.0)

## 2018-12-22 NOTE — Patient Instructions (Signed)
Description   Continue taking 1/2 tablet daily except 1 tablet on Mondays, Wednesdays and Fridays. Recheck INR in 8 weeks. Call us with any medications Changes or concerns # [phone]     

## 2018-12-29 DIAGNOSIS — H2513 Age-related nuclear cataract, bilateral: Secondary | ICD-10-CM | POA: Diagnosis not present

## 2019-01-19 ENCOUNTER — Other Ambulatory Visit: Payer: Self-pay | Admitting: Cardiothoracic Surgery

## 2019-01-19 DIAGNOSIS — I712 Thoracic aortic aneurysm, without rupture, unspecified: Secondary | ICD-10-CM

## 2019-02-02 ENCOUNTER — Other Ambulatory Visit: Payer: Self-pay | Admitting: Cardiothoracic Surgery

## 2019-02-16 ENCOUNTER — Ambulatory Visit (INDEPENDENT_AMBULATORY_CARE_PROVIDER_SITE_OTHER): Payer: BC Managed Care – PPO | Admitting: *Deleted

## 2019-02-16 DIAGNOSIS — I359 Nonrheumatic aortic valve disorder, unspecified: Secondary | ICD-10-CM | POA: Diagnosis not present

## 2019-02-16 DIAGNOSIS — Z952 Presence of prosthetic heart valve: Secondary | ICD-10-CM | POA: Diagnosis not present

## 2019-02-16 DIAGNOSIS — Z5181 Encounter for therapeutic drug level monitoring: Secondary | ICD-10-CM | POA: Diagnosis not present

## 2019-02-16 DIAGNOSIS — Z7901 Long term (current) use of anticoagulants: Secondary | ICD-10-CM | POA: Diagnosis not present

## 2019-02-16 LAB — POCT INR: INR: 3.1 — AB (ref 2.0–3.0)

## 2019-02-16 NOTE — Patient Instructions (Signed)
Description   Continue taking 1/2 tablet daily except 1 tablet on Mondays, Wednesdays and Fridays. Recheck INR in 8 weeks. Call us with any medications Changes or concerns # [phone]     

## 2019-03-04 ENCOUNTER — Other Ambulatory Visit: Payer: Self-pay

## 2019-03-04 ENCOUNTER — Encounter: Payer: Self-pay | Admitting: Cardiothoracic Surgery

## 2019-03-04 ENCOUNTER — Ambulatory Visit
Admission: RE | Admit: 2019-03-04 | Discharge: 2019-03-04 | Disposition: A | Discharge status: Home / Self Care | Payer: BC Managed Care – PPO | Source: Ambulatory Visit | Attending: Cardiothoracic Surgery | Admitting: Cardiothoracic Surgery

## 2019-03-04 ENCOUNTER — Ambulatory Visit: Payer: BC Managed Care – PPO | Admitting: Cardiothoracic Surgery

## 2019-03-04 VITALS — BP 140/80 | HR 78 | Temp 97.8°F | Resp 20 | Ht 69.0 in | Wt 162.0 lb

## 2019-03-04 DIAGNOSIS — I712 Thoracic aortic aneurysm, without rupture, unspecified: Secondary | ICD-10-CM

## 2019-03-04 DIAGNOSIS — I7121 Aneurysm of the ascending aorta, without rupture: Secondary | ICD-10-CM

## 2019-03-04 MED ORDER — IOPAMIDOL (ISOVUE-370) INJECTION 76%
75.0000 mL | Freq: Once | INTRAVENOUS | Status: AC | PRN
  Administered 2019-03-04: 75 mL via INTRAVENOUS

## 2019-03-04 NOTE — Progress Notes (Signed)
[address]       [place] [address]             [id number]         Ryan Lamb Page Medical Record #[id number] Date of Birth: 21-Aug-1955  Referring: Marin Olp, MD Primary Care: Marin Olp, MD Primary cardiologist: Dr Meda Coffee  Chief Complaint:    Chief Complaint  Patient presents with  . Thoracic Aortic Aneurysm    1 year f/u with CTA Chest    History of Present Illness:     Patient returns today with a yearly follow-up CTA of the chest.  In 1971 the patient had aortic valvuloplasty. Subsequently in 1994 I replaced his aortic valve with a #23 [employer] mechanical prosthesis. At the time of surgery   4/20 1994 He was noted to have a tri leaflet aortic valve.  In December of 2011 because of a chest x-ray suggestive of dilatation of his descending aorta he was seen again and has had CT scans of the chest in yearly since 2011.   He has no family history of connective tissue or Marfan's disorder.   He  follows his blood pressure carefully and has not been hypertensive. He continues to follow endocarditis precautions including with dental work. with dental work. He had echocardiogram and nuclear stress test done in August 2017.  He has been he has been diligent about Coumadin follow-up and had very little problems with long-term Coumadin use.  Current Activity/ Functional Status: Patient remains active without physical limitations   Past Medical History  Diagnosis Date  . Aortic stenosis as infant, with recurrent as and valve replacement   . Aortic valve prosthesis present   . Hypercholesterolemia     Past Surgical History  Procedure Date  . Cardiac valve replacement- mechanical aortic valve  1994     There is no family history of Marfan's or other connective tissue disorder or sudden death from dissection .  History   Social History  . Marital Status: Single    Spouse Name: N/A    Number of  Children: N/A  . Years of Education: N/A   Occupational History  . Not on file.   Social History Main Topics  . Smoking status: Former Smoker -- 0.5 packs/day for 5 years    Quit date: 10/17/1977  . Smokeless tobacco: Never Used  . Alcohol Use: No  . Drug Use: No              Social History Narrative  . No narrative on file    Social History   Tobacco Use  Smoking Status Former Smoker  . Packs/day: 0.50  . Years: 5.00  . Pack years: 2.50  . Quit date: 10/17/1977  . Years since quitting: 41.4  Smokeless Tobacco Never Used    Social History   Substance and Sexual Activity  Alcohol Use No     Allergies  Allergen Reactions  . Quinolones     Patient was warned about not using Cipro and similar antibiotics. Recent studies have raised concern that fluoroquinolone antibiotics could be associated with an increased risk of aortic aneurysm Fluoroquinolones have non-antimicrobial properties that might jeopardise the integrity of the extracellular matrix of the vascular wall In a  propensity score matched cohort study in [place], there was a 66% increased rate of aortic  aneurysm or dissection associated with oral fluoroquinolone use, compared wit  . Penicillins Rash    Reaction unknown may have been a rash    Current Outpatient Medications  Medication Sig Dispense Refill  . aspirin EC 81 MG tablet Take 81 mg by mouth daily as needed.    Marland Kitchen atorvastatin (LIPITOR) 20 MG tablet TAKE 1 TABLET BY MOUTH AT 6 PM.PLEASE MAKE APPOINTMENT FOR MORE REFILLS 90 tablet 3  . MULTIPLE VITAMIN PO Take 1 tablet by mouth daily.     . pantoprazole (PROTONIX) 20 MG tablet Take 1 tablet (20 mg total) by mouth daily as needed for heartburn or indigestion. 30 tablet 1  . warfarin (COUMADIN) 5 MG tablet TAKE 1/2 TO 1 TABLET BY MOUTH DAILY OR AS DIRECTED BY COUMADIN CLINIC 70 tablet 0   No current facility-administered medications for this visit.       Review of Systems:  Review of Systems   All other systems reviewed and are negative.     Physical Exam: BP 140/80   Pulse 78   Temp 97.8 F (36.6 C) (Skin)   Resp 20   Ht 5\' 9"  (1.753 m)   Wt 162 lb (73.5 kg)   SpO2 98% Comment: RA  BMI 23.92 kg/m  General appearance: alert, cooperative and no distress Head: Normocephalic, without obvious abnormality, atraumatic Neck: no adenopathy, no carotid bruit, no JVD, supple, symmetrical, trachea midline and thyroid not enlarged, symmetric, no tenderness/mass/nodules Lymph nodes: Cervical, supraclavicular, and axillary nodes normal. Resp: clear to auscultation bilaterally Cardio: Mechanical valve sounds of aortic valve clear without murmur of aortic insufficiency GI: soft, non-tender; bowel sounds normal; no masses,  no organomegaly Extremities: extremities normal, atraumatic, no cyanosis or edema and Homans sign is negative, no sign of DVT Neurologic: Grossly normal  Diagnostic Studies & Laboratory data:     Recent Radiology Findings:   CT ANGIO CHEST AORTA W/CM & OR WO/CM  Result Date: 03/04/2019 CLINICAL DATA:  Follow-up ascending aortic aneurysm. EXAM: CT ANGIOGRAPHY CHEST WITH CONTRAST TECHNIQUE: Multidetector CT imaging of the chest was performed using the standard protocol during bolus administration of intravenous contrast. Multiplanar CT image reconstructions and MIPs were obtained to evaluate the vascular anatomy. CONTRAST:  35mL ISOVUE-370 IOPAMIDOL (ISOVUE-370) INJECTION 76% COMPARISON:  02/26/2018 FINDINGS: Cardiovascular: Unchanged ascending aortic aneurysm with maximal diameter 4.7 cm. No aortic dissection. Mild aortic atherosclerosis. Coronary atherosclerosis. Previous aortic valve replacement. Normal heart size. 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 No pleural effusion. No central pulmonary emboli. Mediastinum/Nodes: No enlarged axillary, mediastinal, or hilar lymph nodes. Unremarkable esophagus and thyroid. Lungs/Pleura: No pleural  effusion or pneumothorax. 6 mm nodule in the medial left upper lobe (series 7, image 64), unchanged from 2020 though with slow growth since 2011 as described previously. No new nodule. Mild right middle lobe scarring. Upper Abdomen: Unchanged 3 cm left upper pole renal cyst. Musculoskeletal: Bilateral gynecomastia. Previous sternotomy. No acute osseous abnormality or suspicious osseous lesion. Review of the MIP images confirms the above findings. IMPRESSION: 1. Unchanged 4.7 cm ascending aortic aneurysm. 2. 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 3. Unchanged 6 mm left upper lobe nodule. Aortic Atherosclerosis (ICD10-I70.0). Aortic aneurysm NOS (ICD10-I71.9). Electronically Signed   By: 2012 M.D.   On: 03/04/2019 15:54   I have independently reviewed the above radiology studies  and reviewed the findings with the patient.    Ct Angio Chest Aorta W/cm &/or Wo/cm  Result Date: 02/26/2018 CLINICAL DATA:  64 year old male with  a history of thoracic aortic aneurysm EXAM: CT ANGIOGRAPHY CHEST WITH CONTRAST TECHNIQUE: Multidetector CT imaging of the chest was performed using the standard protocol during bolus administration of intravenous contrast. Multiplanar CT image reconstructions and MIPs were obtained to evaluate the vascular anatomy. CONTRAST:  2mL ISOVUE-370 IOPAMIDOL (ISOVUE-370) INJECTION 76% COMPARISON:  Most remote 12/11/2009 with multiple prior most recent 03/06/2017 FINDINGS: Cardiovascular: Heart: Heart size unchanged with no cardiomegaly. No pericardial fluid/thickening. Surgical changes of prior aortic valve repair. 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 Calcifications of the LAD. Aorta: My content per any is measurement of the aorta on the CT 12/11/2009 measures 4.7 cm in the greatest axial dimension of the ascending aorta. My measurement made today of the most recent CT comparison of 03/06/2017 is 4.8 cm. No periaortic  fluid or inflammatory changes. No dissection. Minimal atherosclerosis of the aortic arch. Three vessel arch. Descending thoracic aorta unremarkable with no aneurysm or significant atherosclerosis. Pulmonary arteries: Contrast bolus timing is not optimized for the pulmonary arteries. Unremarkable caliber of the proximal pulmonary arteries. Mediastinum/Nodes: No mediastinal adenopathy. Unremarkable thoracic esophagus. Unremarkable thoracic inlet.  Surgical changes of median sternotomy. Lungs/Pleura: Central airways are clear. No pleural effusion. No confluent airspace disease. No pneumothorax. Redemonstration of 6 mm nodule of the left upper lobe on image 70 of series 4. This is unchanged from the most recent comparison CT. This nodule was present on the most remote CT of 12/11/2009, measuring only 2 mm or 3 mm at that time. Upper Abdomen: No acute. Musculoskeletal: No acute displaced fracture. Degenerative changes of the spine. Review of the MIP images confirms the above findings. IMPRESSION: Redemonstration of ascending aortic aneurysm which measures 4.8 cm on today's CT. The diameter was 4.7 cm on the most remote CT of 12/11/2009. Aortic aneurysm NOS (ICD10-I71.9). Redemonstration of surgical changes of median sternotomy and aortic valve replacement. Redemonstration of 6 mm nodule of the left upper lobe. This is unchanged from the most recent comparison CT. This nodule appears to have been present in 2011, with slow interval growth. Attention on follow-up CT scans recommended. If/when the nodule approaches 8 mm-9 mm, a PET-CT may be useful. Aortic Atherosclerosis (ICD10-I70.0). Associated coronary artery disease. 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 Signed, Yvone Neu. Reyne Dumas, RPVI [facility] Radiology Electronically Signed   By: Gilmer Mor D.O.   On: 02/26/2018 13:55    Ct Angio Chest Aorta W/cm &/or Wo/cm  Result Date:  03/06/2017 CLINICAL DATA:  History of aortic valve replacement and dilatation of the ascending thoracic aorta. EXAM: CT ANGIOGRAPHY CHEST WITH CONTRAST TECHNIQUE: Multidetector CT imaging of the chest was performed using the standard protocol during bolus administration of intravenous contrast. Multiplanar CT image reconstructions and MIPs were obtained to evaluate the vascular anatomy. CONTRAST:  68mL ISOVUE-370 IOPAMIDOL (ISOVUE-370) INJECTION 76% Creatinine was obtained on site at [facility] at [address] Results: BUN 12, creatinine 0.9 mg/dL and estimated GFR 93 mL/minute COMPARISON:  12/28/2015 as well as additional annual CTA studies dating back to 2011. FINDINGS: Cardiovascular: Stable dilatation of the ascending thoracic aorta which measures approximately 4.8 cm in maximum diameter. The aortic root at the level of the sinuses of Valsalva measures approximately 3.7 cm. The proximal arch measures 3.7 cm. The distal arch measures 2.8 cm. The descending thoracic aorta measures 2.3 cm. Proximal great vessels show stable and normal patency. The heart size is stable. There is a stable small aneurysm at  the left ventricular apex measuring roughly 8 x 9 mm. No pericardial fluid identified. Stable coronary atherosclerosis with calcified plaque in the distribution of the LAD. CT appearance of the prosthetic aortic valve is unremarkable. Central pulmonary arteries are normal in caliber. Mediastinum/Nodes: No enlarged mediastinal, hilar, or axillary lymph nodes. Thyroid gland, trachea, and esophagus demonstrate no significant findings. Lungs/Pleura: Well-circumscribed noncalcified pulmonary nodule in the anterior and medial aspect of the lingula again noted which currently measures approximately 5 x 6 mm. This nodule has very slowly enlarged over time, measuring approximately 3 mm in 2011 and 2012, 4 mm in 2013, 5 mm in 2015 and 5 mm in 2017. No other pulmonary nodules are identified. There is no  evidence of pulmonary edema, consolidation, pneumothorax or pleural fluid. Upper Abdomen: No acute abnormality. Musculoskeletal: No chest wall abnormality. No acute or significant osseous findings. Review of the MIP images confirms the above findings. IMPRESSION: 1. Stable dilatation of the ascending thoracic aorta which measures approximately 4.8 cm in maximum diameter. 2. Very slowly enlarging solitary lingular lung nodule which measures 6 mm in greatest diameter currently and measured 3 mm in 2011 and 2012. This continues to warrant follow-up. The nodule is likely still too small to be accurately assessed by PET scan. 3. 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 4. Stable coronary atherosclerosis with calcified plaque in the distribution of the LAD. Aortic aneurysm NOS (ICD10-I71.9). Electronically Signed   By: Irish Lack M.D.   On: 03/06/2017 11:46    Ct Angio Chest Aorta W &/or Wo Contrast  Result Date: 12/28/2015 CLINICAL DATA:  Ascending aortic aneurysm.  Follow-up. EXAM: CT ANGIOGRAPHY CHEST WITH CONTRAST TECHNIQUE: Multidetector CT imaging of the chest was performed using the standard protocol during bolus administration of intravenous contrast. Multiplanar CT image reconstructions and MIPs were obtained to evaluate the vascular anatomy. CONTRAST:  75 mL Isovue 370 COMPARISON:  12/29/2014 FINDINGS: Cardiovascular: Unchanged ascending aortic aneurysm with maximal diameter of 4.8 cm. The aorta measures 4.6 cm at the sinuses of Valsalva and 3.2 cm at the sinotubular junction, the proximal transverse aorta measures 3.8 cm, distal transverse aorta measures 2.6 cm, proximal descending thoracic aorta measures 2.4 cm, and distal descending thoracic aorta measures 2.0 cm just above the hiatus, all not significantly changed when measured at a comparable location on the prior study. Prosthetic aortic valve. No central pulmonary embolus on this  nondedicated study. LAD and left circumflex coronary artery calcification. No pericardial effusion. Mediastinum/Nodes: No enlarged axillary, mediastinal, or hilar lymph nodes. Unremarkable thyroid, trachea, and esophagus. Lungs/Pleura: No pleural effusion or pneumothorax. 5 mm medial left upper lobe nodule has not significantly changed in size allowing for differences in slice selection (series 5, image 58). No new nodules. Minimal right middle lobe scarring. Upper Abdomen: Unchanged 2.3 cm left upper pole renal cyst. Musculoskeletal: Bilateral gynecomastia. No acute osseous abnormality or suspicious osseous lesion. Review of the MIP images confirms the above findings. IMPRESSION: Unchanged 4.8 cm ascending aortic aneurysm. Electronically Signed   By: Sebastian Ache M.D.   On: 12/28/2015 12:20   Ct Temporal Bones Wo Contrast  Result Date: 12/18/2015 CLINICAL DATA:  RIGHT greater than LEFT chronic tinnitus, worsening for 4 months. History of multiple treated ear infections. EXAM: CT TEMPORAL BONES WITHOUT CONTRAST TECHNIQUE: Axial and coronal plane CT imaging of the petrous temporal bones was performed with thin-collimation image reconstruction. No intravenous contrast was administered. Multiplanar CT image reconstructions were also generated. COMPARISON:  None.  FINDINGS: RIGHT: External auditory canal is well formed and well aerated. Tympanic membrane is not thickened or retracted. The scutum is sharp. Well aerated middle ear including Prussak's space. Ossicles are well formed and located. Patent aditus ad antrum. Well aerated mastoid air cells without coalescence. Intact tegmen tympani. Intact otic capsule with normal appearance of the inner ear structures. No internal auditory canal expansion. No definite cerebellar pontine angle masses. Mild RIGHT temporomandibular osteoarthrosis, pneumatized pedis apices with bony dehiscence RIGHT glenoid. LEFT: External auditory canal is well formed and aerated. Tympanic  membrane is not thickened or retracted. The scutum is sharp. Well aerated middle ear including Prussak's space. Ossicles are well formed and located. Patent aditus ad antrum. Well aerated mastoid air cells without coalescence. Intact tegmen tympani. Intact otic capsule with normal appearance of the inner ear structures. No internal auditory canal expansion. No definite cerebellar pontine angle masses. Mild calcific atherosclerosis the carotid siphons. IMPRESSION: Bony dehiscence RIGHT temporomandibular joint/glenoid fossa. Otherwise negative bilateral temporal bone CT. Electronically Signed   By: Awilda Metro M.D.   On: 12/18/2015 16:57   Ct Angio Chest Aorta W/cm &/or Wo/cm  10/28/2013   CLINICAL DATA:  Ascending thoracic aortic aneurysm, followup, personal history of cardiac valve replacement, cardiac surgery, quit smoking 30 years ago  EXAM: CT ANGIOGRAPHY CHEST WITH CONTRAST  TECHNIQUE: Multidetector CT imaging of the chest was performed using the standard protocol during bolus administration of intravenous contrast. Multiplanar CT image reconstructions and MIPs were obtained to evaluate the vascular anatomy.  CONTRAST:  21mL OMNIPAQUE IOHEXOL 350 MG/ML SOLN  COMPARISON:  10/08/2012, 09/12/2011  FINDINGS: Replacement aortic valve noted. Aortic root is dilated to 4.4 cm transverse diameter as it was previously. Mid ascending aorta is distended to 4.8 cm, unchanged. Proximal aortic arch is 3.8 cm, not significantly different from prior study. Distal aortic arch is 2.8 cm, stable. Proximal descending aorta is 2.6 cm, stable. Distal descending aorta is 2.1 cm, stable. No evidence of dissection or stenosis. The origins of the great vessels all appear normal. No significant calcified atherosclerotic plaque identified.  There is calcification of the left anterior descending artery. There is no significant pleural or pericardial effusion. Patient is status post previous median sternotomy. No significant hilar  or mediastinal adenopathy.  4 mm pulmonary nodule image number 20 in the medial lingula is stable from 09/12/2011 and therefore considered benign. Stable cyst upper pole left kidney. No acute musculoskeletal findings.  Review of the MIP images confirms the above findings.  IMPRESSION: No change in ascending aortic aneurysm to a maximal diameter of 4.8 cm.   Electronically Signed   By: Esperanza Heir M.D.   On: 10/28/2013 09:30   Ct Angio Chest Aorta W/cm &/or Wo/cm  10/08/2012   CLINICAL DATA:  Thoracic aortic aneurysm, aortic stenosis, post mechanical prosthesis placement.  EXAM: CT ANGIOGRAPHY OF CHEST  TECHNIQUE: Multidetector CT imaging of the chest was performed during bolus injection of intravenous contrast. Multiplanar CT angiographic image reconstructions including MIPs were also generated to evaluate the vascular anatomy.  CONTRAST:  OMNIPAQUE IOHEXOL 350 MG/ML SOLN  COMPARISON:  09/12/2011  FINDINGS: Stable appearance of prosthetic aortic valve. Postop change of the aortic root which is ectatic measuring 4.4 cm maximum transverse diameter. Proximal ascending 4.8 cm, distal ascending 4.7 cm, proximal arch 3.7 cm, distal arch 2.8 cm, proximal descending 2.6 cm, and distal descending 2.1 cm just above the diaphragm. Negative for dissection or stenosis. No significant atheromatous irregularity. Classic 3 vessel brachiocephalic arterial  origin anatomy without proximal stenosis. There is fairly good contrast opacification of the pulmonary arterial tree; exam was not optimized for detection of pulmonary emboli.  Previous median sternotomy. No pleural or pericardial effusion. Patchy coronary calcifications. No hilar or mediastinal adenopathy. Lungs are clear. Stable cyst In the upper pole left kidney. Remainder visualized upper abdomen unremarkable. thoracic spine intact.  IMPRESSION: IMPRESSION  1. Stable 4.8 cm ascending aortic aneurysm without dissection or other complicating features   Electronically  Signed   By: Oley Balmaniel  Hassell M.D.   On: 10/08/2012 14:37    Recent Lab Findings: Lab Results  Component Value Date   WBC 6.4 12/17/2018   HGB 15.4 12/17/2018   HCT 44.9 12/17/2018   PLT 234.0 12/17/2018   GLUCOSE 83 12/17/2018   CHOL 174 12/17/2018   TRIG 88.0 12/17/2018   HDL 55.20 12/17/2018   LDLCALC 101 (H) 12/17/2018   ALT 25 12/17/2018   AST 25 12/17/2018   NA 141 12/17/2018   K 3.9 12/17/2018   CL 105 12/17/2018   CREATININE 0.94 12/17/2018   BUN 12 12/17/2018   CO2 28 12/17/2018   TSH 1.53 12/15/2017   INR 3.1 (A) 02/16/2019   Aortic Size Index=     4.8  /Body surface area is 1.89 meters squared. =2.56  < 2.75 cm/m2      4% risk per year 2.75 to 4.25          8% risk per year > 4.25 cm/m2    20% risk per year    Assessment / Plan:   1/ No change in 4.8  since 2011 cm ascending aortic dilatation in patient with previous mechanic aortic valve replacement. There's been no appreciable change in the size of the aorta since his scan in 2011. I discussed the diagnosis with the patient and we have decided to continue to monitor the size of his aorta and will obtain a followup CT scan in one year.   2/ unchanged  6 mm in greatest diameter lingular nodule  lung ,  currently and unchanged from last year ,  measured 3 mm in 2011 and 2012.   Plan follow-up CTA of the chest 1 year  Delight Ovensdward B Tinea Nobile MD  Beeper [phone] Office [phone] 03/04/2019 4:20 PM

## 2019-04-14 ENCOUNTER — Ambulatory Visit (INDEPENDENT_AMBULATORY_CARE_PROVIDER_SITE_OTHER): Payer: BC Managed Care – PPO | Admitting: *Deleted

## 2019-04-14 ENCOUNTER — Other Ambulatory Visit: Payer: Self-pay

## 2019-04-14 DIAGNOSIS — Z952 Presence of prosthetic heart valve: Secondary | ICD-10-CM | POA: Diagnosis not present

## 2019-04-14 DIAGNOSIS — Z7901 Long term (current) use of anticoagulants: Secondary | ICD-10-CM

## 2019-04-14 DIAGNOSIS — Z5181 Encounter for therapeutic drug level monitoring: Secondary | ICD-10-CM | POA: Diagnosis not present

## 2019-04-14 DIAGNOSIS — I359 Nonrheumatic aortic valve disorder, unspecified: Secondary | ICD-10-CM

## 2019-04-14 LAB — POCT INR: INR: 3 (ref 2.0–3.0)

## 2019-04-14 NOTE — Patient Instructions (Signed)
Description   Continue taking 1/2 tablet daily except 1 tablet on Mondays, Wednesdays and Fridays. Recheck INR in 8 weeks. Call us with any medications Changes or concerns # [phone]     

## 2019-06-08 ENCOUNTER — Ambulatory Visit (INDEPENDENT_AMBULATORY_CARE_PROVIDER_SITE_OTHER): Payer: BC Managed Care – PPO | Admitting: *Deleted

## 2019-06-08 ENCOUNTER — Other Ambulatory Visit: Payer: Self-pay

## 2019-06-08 DIAGNOSIS — Z5181 Encounter for therapeutic drug level monitoring: Secondary | ICD-10-CM

## 2019-06-08 DIAGNOSIS — I359 Nonrheumatic aortic valve disorder, unspecified: Secondary | ICD-10-CM | POA: Diagnosis not present

## 2019-06-08 DIAGNOSIS — Z952 Presence of prosthetic heart valve: Secondary | ICD-10-CM | POA: Diagnosis not present

## 2019-06-08 DIAGNOSIS — Z7901 Long term (current) use of anticoagulants: Secondary | ICD-10-CM | POA: Diagnosis not present

## 2019-06-08 LAB — POCT INR: INR: 2.5 (ref 2.0–3.0)

## 2019-06-08 NOTE — Patient Instructions (Signed)
Description   Continue taking 1/2 tablet daily except 1 tablet on Mondays, Wednesdays and Fridays. Recheck INR in 8 weeks. Call us with any medications Changes or concerns # [phone]     

## 2019-07-13 DIAGNOSIS — D2271 Melanocytic nevi of right lower limb, including hip: Secondary | ICD-10-CM | POA: Diagnosis not present

## 2019-07-13 DIAGNOSIS — D2272 Melanocytic nevi of left lower limb, including hip: Secondary | ICD-10-CM | POA: Diagnosis not present

## 2019-07-13 DIAGNOSIS — D225 Melanocytic nevi of trunk: Secondary | ICD-10-CM | POA: Diagnosis not present

## 2019-07-13 DIAGNOSIS — D2261 Melanocytic nevi of right upper limb, including shoulder: Secondary | ICD-10-CM | POA: Diagnosis not present

## 2019-07-27 ENCOUNTER — Telehealth: Payer: Self-pay

## 2019-07-27 NOTE — Telephone Encounter (Signed)
Please call patient we have not seen or ever given this med for patient

## 2019-07-27 NOTE — Telephone Encounter (Signed)
Pt is requesting atorvastatin (LIPITOR) 40 MG tablet to start on.

## 2019-08-03 ENCOUNTER — Other Ambulatory Visit: Payer: Self-pay

## 2019-08-03 ENCOUNTER — Ambulatory Visit (INDEPENDENT_AMBULATORY_CARE_PROVIDER_SITE_OTHER): Payer: BC Managed Care – PPO | Admitting: *Deleted

## 2019-08-03 DIAGNOSIS — Z5181 Encounter for therapeutic drug level monitoring: Secondary | ICD-10-CM | POA: Diagnosis not present

## 2019-08-03 DIAGNOSIS — Z952 Presence of prosthetic heart valve: Secondary | ICD-10-CM

## 2019-08-03 LAB — POCT INR: INR: 2.7 (ref 2.0–3.0)

## 2019-08-03 NOTE — Patient Instructions (Signed)
Description   Continue taking 1/2 tablet daily except 1 tablet on Mondays, Wednesdays and Fridays. Recheck INR in 8 weeks. Call us with any medications Changes or concerns # [phone]     

## 2019-08-13 NOTE — Progress Notes (Signed)
Phone [phone] In person visit   Subjective:   Ryan Lamb is a 64 y.o. year old very pleasant male patient who presents for/with See problem oriented charting Chief Complaint  Patient presents with  . Follow-up   This visit occurred during the SARS-CoV-2 public health emergency.  Safety protocols were in place, including screening questions prior to the visit, additional usage of staff PPE, and extensive cleaning of exam room while observing appropriate contact time as indicated for disinfecting solutions.   Past Medical History-  Patient Active Problem List   Diagnosis Date Noted  . Ascending aortic aneurysm (HCC) 10/18/2010    Priority: High  . Hx of mechanical aortic valve replacement     Priority: High  . History of adenomatous polyp of colon 12/11/2015    Priority: Medium  . GERD (gastroesophageal reflux disease) 04/22/2011    Priority: Medium  . Hypercholesterolemia     Priority: Medium  . Encounter for therapeutic drug monitoring 12/08/2015    Priority: Low  . Long term (current) use of anticoagulants 05/08/2010    Priority: Low    Medications- reviewed and updated Current Outpatient Medications  Medication Sig Dispense Refill  . aspirin EC 81 MG tablet Take 81 mg by mouth daily as needed.    . MULTIPLE VITAMIN PO Take 1 tablet by mouth daily.     . pantoprazole (PROTONIX) 20 MG tablet Take 1 tablet (20 mg total) by mouth daily as needed for heartburn or indigestion. 30 tablet 1  . warfarin (COUMADIN) 5 MG tablet TAKE 1/2 TO 1 TABLET BY MOUTH DAILY OR AS DIRECTED BY COUMADIN CLINIC 70 tablet 0  . rosuvastatin (CRESTOR) 20 MG tablet Take 1 tablet (20 mg total) by mouth daily. 90 tablet 3   No current facility-administered medications for this visit.     Objective:  BP 128/76   Pulse 85   Temp 97.8 F (36.6 C) (Temporal)   Wt 159 lb 6.4 oz (72.3 kg)   SpO2 98%   BMI 23.54 kg/m  Gen: NAD, resting comfortably CV: RRR stable murmur Lungs:  CTAB no crackles, wheeze, rhonchi Ext: no edema Skin: warm, dry     Assessment and Plan   #hyperlipidemia S: Medication: Atorvastatin 20 mg.  At last visit I recommended increasing to atorvastatin 40 mg-we were unable to reach him by phone but did read my chart message.   Exercises 3-4 days a week outside of work plus gets a lot of steps In at work at [employer] Value Date   CHOL 174 12/17/2018   HDL 55.20 12/17/2018   LDLCALC 101 (H) 12/17/2018   TRIG 88.0 12/17/2018   CHOLHDL 3 12/17/2018   A/P: poor control. Stop atorvastatin. Start rosuvastatin 20mg . This is stronger and im hoping we can get bad cholesterol under 100 at least by follow up. Ideal goal is 70 or less with new guidelines and we can increase if needed at that time.   #CV follow up- Ascending aortic aneurysm-follow with Dr. CTA stable at 4.7 cm.  Also has history of mechanical aortic valve and follows with cardiology-done in 1994.  Remains on long-term Coumadin.  Recommended follow up:  Keep December physical Future Appointments  Date Time Provider Department Center  09/28/2019  8:00 AM [facility]  12/23/2019  8:20 AM 12/25/2019, Durene Cal, MD [facility]   Lab/Order associations:   ICD-10-CM   1. Hypercholesterolemia  E78.00    Meds ordered  this encounter  Medications  . rosuvastatin (CRESTOR) 20 MG tablet    Sig: Take 1 tablet (20 mg total) by mouth daily.    Dispense:  90 tablet    Refill:  3   Return precautions advised.  Tana Conch, MD

## 2019-08-13 NOTE — Patient Instructions (Addendum)
Stop atorvastatin. Start rosuvastatin 20mg . This is stronger and im hoping we can get bad cholesterol under 100 at least by follow up. Ideal goal is 70 or less with new guidelines and we can increase if needed at that time.   Recommend flu shot in the fall- September or October - if you dont have by December visit we will do it then

## 2019-08-16 ENCOUNTER — Ambulatory Visit: Payer: BC Managed Care – PPO | Admitting: Family Medicine

## 2019-08-16 ENCOUNTER — Other Ambulatory Visit: Payer: Self-pay

## 2019-08-16 ENCOUNTER — Encounter: Payer: Self-pay | Admitting: Family Medicine

## 2019-08-16 VITALS — BP 128/76 | HR 85 | Temp 97.8°F | Wt 159.4 lb

## 2019-08-16 DIAGNOSIS — E7800 Pure hypercholesterolemia, unspecified: Secondary | ICD-10-CM

## 2019-08-16 MED ORDER — ROSUVASTATIN CALCIUM 20 MG PO TABS: 20.0000 mg | ORAL_TABLET | Freq: Every day | ORAL | 3 refills | Status: DC

## 2019-08-28 ENCOUNTER — Other Ambulatory Visit: Payer: Self-pay | Admitting: Cardiology

## 2019-09-28 ENCOUNTER — Ambulatory Visit (INDEPENDENT_AMBULATORY_CARE_PROVIDER_SITE_OTHER): Payer: BC Managed Care – PPO | Admitting: *Deleted

## 2019-09-28 ENCOUNTER — Other Ambulatory Visit: Payer: Self-pay

## 2019-09-28 DIAGNOSIS — Z952 Presence of prosthetic heart valve: Secondary | ICD-10-CM | POA: Diagnosis not present

## 2019-09-28 DIAGNOSIS — Z5181 Encounter for therapeutic drug level monitoring: Secondary | ICD-10-CM | POA: Diagnosis not present

## 2019-09-28 LAB — POCT INR: INR: 3.1 — AB (ref 2.0–3.0)

## 2019-09-28 NOTE — Patient Instructions (Signed)
Description   Continue taking 1/2 tablet daily except 1 tablet on Mondays, Wednesdays and Fridays. Recheck INR in 8 weeks. Call us with any medications Changes or concerns # [phone]

## 2019-11-08 ENCOUNTER — Encounter: Payer: Self-pay | Admitting: Cardiology

## 2019-11-08 ENCOUNTER — Other Ambulatory Visit: Payer: Self-pay

## 2019-11-08 ENCOUNTER — Ambulatory Visit: Payer: BC Managed Care – PPO | Admitting: Cardiology

## 2019-11-08 ENCOUNTER — Other Ambulatory Visit: Payer: Self-pay | Admitting: Cardiology

## 2019-11-08 VITALS — BP 126/80 | HR 90 | Ht 69.0 in | Wt 157.4 lb

## 2019-11-08 DIAGNOSIS — Z7901 Long term (current) use of anticoagulants: Secondary | ICD-10-CM | POA: Diagnosis not present

## 2019-11-08 DIAGNOSIS — I712 Thoracic aortic aneurysm, without rupture: Secondary | ICD-10-CM

## 2019-11-08 DIAGNOSIS — Z952 Presence of prosthetic heart valve: Secondary | ICD-10-CM | POA: Diagnosis not present

## 2019-11-08 DIAGNOSIS — E7800 Pure hypercholesterolemia, unspecified: Secondary | ICD-10-CM | POA: Diagnosis not present

## 2019-11-08 DIAGNOSIS — I7121 Aneurysm of the ascending aorta, without rupture: Secondary | ICD-10-CM

## 2019-11-08 NOTE — Progress Notes (Signed)
Cardiology Office Note    Date:  11/08/2019   ID:  Ryan Lamb, DOB [birth year], MRN [id number]  PCP:  Shelva Majestic, MD  Cardiologist: Ryan Alexander, MD  EPS: None  Reason for visit: Annual follow-up  History of Present Illness:  Ryan Lamb is a 64 y.o. male with history of aortic valvuloplasty 1971, [employer] AVR 1994 on Coumadin, 4.8 cm ascending aortic aneurysm 02/2018 on CT, Also noted 6 mm LUL nodule, stable plaque in LAD/ Also has HLD. lexiscan 2017 borderline but a lot of artifact and asymptomatic so no further testing.  Is coming for regular follow-up, he has no acute problem, he exercises on a regular basis and no symptoms of chest pain or shortness of breath.  He is compliant with his medications and has no side effects, no myalgias and no bleeding.  He has not noticed any lower extremity edema orthopnea or proximal nocturnal dyspnea.  No palpitations.  Past Medical History:  Diagnosis Date  . Aortic stenosis   . Aortic valve prosthesis present   . Dissecting aortic aneurysm (any part), thoracic (HCC)   . Diverticulosis   . Gastric ulcer    due to nsaids in past  . Hypercholesterolemia   . Mechanical heart valve present     Past Surgical History:  Procedure Laterality Date  . CARDIAC VALVE REPLACEMENT    . EEC     with propofol   . ESOPHAGOGASTRODUODENOSCOPY  01/2010, 04/2010  . ESOPHAGOGASTRODUODENOSCOPY     cauterize after nsaid related ulcer  . EXPLORATION POST OPERATIVE OPEN HEART  1994, 1972    Current Medications: Current Meds  Medication Sig  . MULTIPLE VITAMIN PO Take 1 tablet by mouth daily.   . pantoprazole (PROTONIX) 20 MG tablet Take 1 tablet (20 mg total) by mouth daily as needed for heartburn or indigestion.  . rosuvastatin (CRESTOR) 20 MG tablet Take 1 tablet (20 mg total) by mouth daily.  Marland Kitchen warfarin (COUMADIN) 5 MG tablet TAKE 1/2 TO 1 TABLET BY MOUTH DAILY AS DIRECTED BY COUMADIN CLINIC  . [DISCONTINUED]  aspirin EC 81 MG tablet Take 81 mg by mouth daily as needed.     Allergies:   Quinolones and Penicillins   Social History   Socioeconomic History  . Marital status: Single    Spouse name: Not on file  . Number of children: Not on file  . Years of education: Not on file  . Highest education level: Not on file  Occupational History  . Not on file  Tobacco Use  . Smoking status: Former Smoker    Packs/day: 0.50    Years: 5.00    Pack years: 2.50    Quit date: 10/17/1977    Years since quitting: 42.0  . Smokeless tobacco: Never Used  Substance and Sexual Activity  . Alcohol use: No  . Drug use: No  . Sexual activity: Not on file  Other Topics Concern  . Not on file  Social History Narrative   Single. Lives with parents right now.       Undergrad at [employer], Masters oral roberts- divinity. Hard to get into ministry.    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 Difficulty of Paying Living Expenses: Not on file  Food Insecurity:   . Worried  About Running Out of Food in the Last Year: Not on file  . Ran Out of Food in the Last Year: Not on file  Transportation Needs:   . Lack of Transportation (Medical): Not on file  . Lack of Transportation (Non-Medical): Not on file  Physical Activity:   . Days of Exercise per Week: Not on file  . Minutes of Exercise per Session: Not on file  Stress:   . Feeling of Stress : Not on file  Social Connections:   . Frequency of Communication with Friends and Family: Not on file  . Frequency of Social Gatherings with Friends and Family: Not on file  . Attends Religious Services: Not on file  . Active Member of Clubs or Organizations: Not on file  . Attends BankerClub or Organization Meetings: Not on file  . Marital Status: Not on file     Family History:  The patient's   family  history includes Coronary artery disease in his father and mother; Healthy in his brother and sister; Schizophrenia in his sister; Skin cancer in his father.   ROS:   Please see the history of present illness.    ROS All other systems reviewed and are negative.   PHYSICAL EXAM:   VS:  BP 126/80   Pulse 90   Ht 5\' 9"  (1.753 m)   Wt 157 lb 6.4 oz (71.4 kg)   SpO2 96%   BMI 23.24 kg/m   Physical Exam  GEN: Well nourished, well developed, in no acute distress  Neck: no JVD, carotid bruits, or masses Cardiac:RRR; crisp valvular clicks 1- 2/6 systolic murmur LSB Respiratory:  clear to auscultation bilaterally, normal work of breathing GI: soft, nontender, nondistended, + BS Ext: without cyanosis, clubbing, or edema, Good distal pulses bilaterally Neuro:  Alert and Oriented x 3, Psych: euthymic mood, full affect  Wt Readings from Last 3 Encounters:  11/08/19 157 lb 6.4 oz (71.4 kg)  08/16/19 159 lb 6.4 oz (72.3 kg)  03/04/19 162 lb (73.5 kg)      Studies/Labs Reviewed:   EKG:  EKG is  ordered today.  The ekg ordered today demonstrates NSR normal EKG   Recent Labs: 12/17/2018: ALT 25; BUN 12; Creatinine, Ser 0.94; Hemoglobin 15.4; Platelets 234.0; Potassium 3.9; Sodium 141   Lipid Panel    Component Value Date/Time   CHOL 174 12/17/2018 0916   TRIG 88.0 12/17/2018 0916   HDL 55.20 12/17/2018 0916   CHOLHDL 3 12/17/2018 0916   VLDL 17.6 12/17/2018 0916   LDLCALC 101 (H) 12/17/2018 0916    Additional studies/ records that were reviewed today include:  Echo 2017Study Conclusions   - Left ventricle: The cavity size was normal. Wall thickness was   normal. Systolic function was normal. The estimated ejection   fraction was in the range of 55% to 60%. Features are consistent   with a pseudonormal left ventricular filling pattern, with   concomitant abnormal relaxation and increased filling pressure   (grade 2 diastolic dysfunction). - Aortic valve: Mechanical aortic  valve. No significant prosthetic   valve stenosis. There was no significant regurgitation. Mean   gradient (S): 12 mm Hg. - Aorta: Mildly dilated aortic root. Aortic root dimension: 37 mm   (ED). - Right ventricle: The cavity size was normal. Systolic function   was normal. - Tricuspid valve: Peak RV-RA gradient (S): 20 mm Hg. - Pulmonary arteries: PA peak pressure: 23 mm Hg (S). - Inferior vena cava: The vessel was normal in size.  The   respirophasic diameter changes were in the normal range (= 50%),   consistent with normal central venous pressure.   Impressions:   - Normal LV size with EF 55-60%. Moderate diastolic dysfunction.   Normal RV size and systolic function. Mechanical aortic valve   appears to function normally.  Lexiscan 2017   The left ventricular ejection fraction is normal (55-65%).  Nuclear stress EF: 63%.  No T wave inversion was noted during stress.  There was no ST segment deviation noted during stress.  Defect 1: There is a medium defect of moderate severity.   Moderate size and intensity, reversible inferolateral perfusion defect. There is underlying bowel attenuation, which could cause a degree of artifact, however, the SDS is 4 and significant ischemia cannot be excluded. LVEF 63% with normal wall motion. Clinical correlation is advised.    ASSESSMENT:    1. Hx of mechanical aortic valve replacement   2. Long term current use of anticoagulant therapy   3. Ascending aortic aneurysm (HCC)   4. Hypercholesterolemia   Subcu PLAN:  In order of problems listed above:  Mechanical AVR 1994 on Coumadin last echo stable valve 2017-no new symptoms. Doing well. F/U with Dr. Delton See 1 yr. most recent echocardiogram from 2017 showed mean transaortic gradient of 12 mmHg, his physical exam is unchanged.  I will repeat his echocardiogram as he has not had any in 4 years.  He has no bleeding on Coumadin, we will discontinue aspirin.  Ascending Aortic aneurysm 4.8  cm on CTA 02/2018 followed by Dr.Gerhardt, this year chest CTA performed in February 2021 showed stable size of 47 mm.  Hyperlipidemia -on moderate dose of Crestor 20 mg daily, most recent lipids showed triglycerides 88, LDL 101, HDL 55, he is tolerating rosuvastatin well.  Medication Adjustments/Labs and Tests Ordered: Current medicines are reviewed at length with the patient today.  Concerns regarding medicines are outlined above.  Medication changes, Labs and Tests ordered today are listed in the Patient Instructions below. Patient Instructions  Medication Instructions:   STOP TAKING ASPIRIN NOW  *If you need a refill on your cardiac medications before your next appointment, please call your pharmacy*   Testing/Procedures:  Your physician has requested that you have an echocardiogram. Echocardiography is a painless test that uses sound waves to create images of your heart. It provides your doctor with information about the size and shape of your heart and how well your heart's chambers and valves are working. This procedure takes approximately one hour. There are no restrictions for this procedure.  Follow-Up: At [facility], you and your health needs are our priority.  As part of our continuing mission to provide you with exceptional heart care, we have created designated Provider Care Teams.  These Care Teams include your primary Cardiologist (physician) and Advanced Practice Providers (APPs -  Physician Assistants and Nurse Practitioners) who all work together to provide you with the care you need, when you need it.  We recommend signing up for the patient portal called "MyChart".  Sign up information is provided on this After Visit Summary.  MyChart is used to connect with patients for Virtual Visits (Telemedicine).  Patients are able to view lab/test results, encounter notes, upcoming appointments, etc.  Non-urgent messages can be sent to your provider as well.   To learn more about  what you can do with MyChart, go to [email or web address].    Your next appointment:   1 year(s)  The format for your next appointment:  In Person  Provider:   Tobias Alexander, MD      Signed, Ryan Alexander, MD  11/08/2019 4:41 PM    [facility] [address] [place], [place], [place]  [address] Phone: [phone]; Fax: [phone]

## 2019-11-08 NOTE — Patient Instructions (Signed)
Medication Instructions:   STOP TAKING ASPIRIN NOW  *If you need a refill on your cardiac medications before your next appointment, please call your pharmacy*   Testing/Procedures:  Your physician has requested that you have an echocardiogram. Echocardiography is a painless test that uses sound waves to create images of your heart. It provides your doctor with information about the size and shape of your heart and how well your heart's chambers and valves are working. This procedure takes approximately one hour. There are no restrictions for this procedure.  Follow-Up: At [facility], you and your health needs are our priority.  As part of our continuing mission to provide you with exceptional heart care, we have created designated Provider Care Teams.  These Care Teams include your primary Cardiologist (physician) and Advanced Practice Providers (APPs -  Physician Assistants and Nurse Practitioners) who all work together to provide you with the care you need, when you need it.  We recommend signing up for the patient portal called "MyChart".  Sign up information is provided on this After Visit Summary.  MyChart is used to connect with patients for Virtual Visits (Telemedicine).  Patients are able to view lab/test results, encounter notes, upcoming appointments, etc.  Non-urgent messages can be sent to your provider as well.   To learn more about what you can do with MyChart, go to [email or web address].    Your next appointment:   1 year(s)  The format for your next appointment:   In Person  Provider:   Tobias Alexander, MD

## 2019-11-23 ENCOUNTER — Ambulatory Visit (INDEPENDENT_AMBULATORY_CARE_PROVIDER_SITE_OTHER): Payer: BC Managed Care – PPO | Admitting: *Deleted

## 2019-11-23 ENCOUNTER — Other Ambulatory Visit: Payer: Self-pay

## 2019-11-23 DIAGNOSIS — Z952 Presence of prosthetic heart valve: Secondary | ICD-10-CM | POA: Diagnosis not present

## 2019-11-23 DIAGNOSIS — Z5181 Encounter for therapeutic drug level monitoring: Secondary | ICD-10-CM

## 2019-11-23 LAB — POCT INR: INR: 4.1 — AB (ref 2.0–3.0)

## 2019-11-23 NOTE — Patient Instructions (Signed)
Description   Do not take any Warfarin today then continue taking 1/2 tablet daily except 1 tablet on Mondays, Wednesdays and Fridays. Recheck INR in 6 weeks. Call us with any medications Changes or concerns # [phone]

## 2019-12-06 ENCOUNTER — Telehealth (HOSPITAL_COMMUNITY): Payer: Self-pay | Admitting: Cardiology

## 2019-12-06 NOTE — Telephone Encounter (Signed)
Patient cancelled echo due to unable to afford due to insurance will not cover. Order will be removed from the echo wq.

## 2019-12-06 NOTE — Telephone Encounter (Signed)
Will send this message to Dr. Nelson as a general FYI.  

## 2019-12-08 ENCOUNTER — Other Ambulatory Visit (HOSPITAL_COMMUNITY): Payer: BC Managed Care – PPO

## 2019-12-22 NOTE — Progress Notes (Signed)
Phone: [phone]   Subjective:  Patient presents today for their annual physical. Chief complaint-noted.   See problem oriented charting- ROS- full  review of systems was completed and negative per full ROS sheet  The following were reviewed and entered/updated in epic: Past Medical History:  Diagnosis Date  . Aortic stenosis   . Aortic valve prosthesis present   . Dissecting aortic aneurysm (any part), thoracic (HCC)   . Diverticulosis   . Gastric ulcer    due to nsaids in past  . Hypercholesterolemia   . Mechanical heart valve present    Patient Active Problem List   Diagnosis Date Noted  . Ascending aortic aneurysm (HCC) 10/18/2010    Priority: High  . Hx of mechanical aortic valve replacement     Priority: High  . History of adenomatous polyp of colon 12/11/2015    Priority: Medium  . GERD (gastroesophageal reflux disease) 04/22/2011    Priority: Medium  . Hypercholesterolemia     Priority: Medium  . Encounter for therapeutic drug monitoring 12/08/2015    Priority: Low  . Long term (current) use of anticoagulants 05/08/2010    Priority: Low   Past Surgical History:  Procedure Laterality Date  . CARDIAC VALVE REPLACEMENT    . EEC     with propofol   . ESOPHAGOGASTRODUODENOSCOPY  01/2010, 04/2010  . ESOPHAGOGASTRODUODENOSCOPY     cauterize after nsaid related ulcer  . EXPLORATION POST OPERATIVE OPEN HEART  1994, 1972    Family History  Problem Relation Age of Onset  . Coronary artery disease Mother        CABG. 81 in 2019.   Marland Kitchen Coronary artery disease Father        aortic valve replacement  . Skin cancer Father         40 in 2019.   Marland Kitchen Healthy Sister   . Healthy Brother        overweight  . Schizophrenia Sister   . GI problems Neg Hx     Medications- reviewed and updated Current Outpatient Medications  Medication Sig Dispense Refill  . MULTIPLE VITAMIN PO Take 1 tablet by mouth daily.     . pantoprazole (PROTONIX) 20 MG tablet Take 1 tablet (20 mg  total) by mouth daily as needed for heartburn or indigestion. 30 tablet 1  . rosuvastatin (CRESTOR) 20 MG tablet Take 1 tablet (20 mg total) by mouth daily. 90 tablet 3  . warfarin (COUMADIN) 5 MG tablet TAKE 1/2 TO 1 TABLET BY MOUTH DAILY AS DIRECTED BY COUMADIN CLINIC 70 tablet 1   No current facility-administered medications for this visit.    Allergies-reviewed and updated Allergies  Allergen Reactions  . Quinolones     Patient was warned about not using Cipro and similar antibiotics. Recent studies have raised concern that fluoroquinolone antibiotics could be associated with an increased risk of aortic aneurysm Fluoroquinolones have non-antimicrobial properties that might jeopardise the integrity of the extracellular matrix of the vascular wall In a  propensity score matched cohort study in [place], there was a 66% increased rate of aortic aneurysm or dissection associated with oral fluoroquinolone use, compared wit  . Penicillins Rash    Reaction unknown may have been a rash    Social History   Social History Narrative   Single. Lives with parents right now.       Undergrad at [employer], Masters oral roberts- divinity. Hard to get into ministry.    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 Oropharynx normal Neck: no thyromegaly CV: RRR no murmurs rubs or gallops Lungs: CTAB no crackles, wheeze, rhonchi Abdomen: soft/nontender/nondistended/normal bowel sounds. No rebound or guarding.  Ext: no edema Skin: warm, dry Neuro: grossly normal, moves all extremities, PERRLA   Assessment and Plan  64 y.o. male presenting for annual physical.   Health Maintenance counseling: 1. Anticipatory guidance: Patient counseled regarding regular dental exams q6 months, eye exams yearly,  avoiding smoking and second hand smoke, limiting alcohol to 2 beverages per day- does not drink at all.   2. Risk factor reduction:  Advised patient of need for regular exercise and diet rich and fruits and vegetables to reduce risk of heart attack and stroke. Exercise-cardio about 3 times a week- mainly walking plus walks 3 miles a day at work. Diet-weight down 3 pounds from last year-he had tried to cut back on carbs and improve diet-weight in healthy range.  Wt Readings from Last 3 Encounters:  12/23/19 158 lb 12.8 oz (72 kg)  11/08/19 157 lb 6.4 oz (71.4 kg)  08/16/19 159 lb 6.4 oz (72.3 kg)  3. Immunizations/screenings/ancillary studies-fully up-to-date other than covid-19 booster and flu shots- flu shot today. With 2nd moderna had some paresthesias like electrical signals up and down both legs for a few weeks- on th fence for now but I still think would be reasonable to do this.   Immunization History  Administered Date(s) Administered  . Influenza Inj Mdck Quad Pf 09/17/2018  . Influenza,inj,Quad PF,6+ Mos 11/21/2014, 08/10/2015, 12/12/2016, 10/04/2017  . Moderna Sars-Covid-2 Vaccination 05/12/2019, 06/09/2019  . Tdap 11/21/2014  . Zoster Recombinat (Shingrix) 12/15/2017, 03/18/2018  g 4. Prostate cancer screening- PSA trend overall has been low risk.  We will update with labs today Lab Results  Component Value Date   PSA 1.27 12/17/2018   PSA 1.85 12/15/2017   PSA 1.26 02/17/2017   5. Colon cancer screening - history of adenomatous colon polyp with repeat due August 15, 2020 6. Skin cancer screening-[facility]. advised regular sunscreen use. Denies worrisome, changing, or new skin lesions.  7. Former smoker-2.5 pack years and quit in 1970s.  We will plan on AAA screening at age 89-already has known  ascending aortic aneurysm followed by Dr. 08-31-1997 as below 8. STD screening - opts out as not sexually active  Status of chronic or acute concerns   #hyperlipidemia S: Medication:  Rosuvastatin 20mg  Lab Results  Component Value Date   CHOL 174 12/17/2018   HDL 55.20 12/17/2018   LDLCALC 101 (H) 12/17/2018   TRIG 88.0 12/17/2018   CHOLHDL 3 12/17/2018   A/P: hoping for LDL at least below 100. We reflected back and though ideal LDL under 70- technically his goal should be 30-50% reduction from peak- his peak LDL I cannot find though so we will use goal of 100 or less.   #Cardiovascular follow-up-just had visit with cardiology November 1.  Patient is also followed by Dr. 14/10/2018 (next visit feb 2022 likely) with last CT angiogram showing stable  aortic aneurysm a sending.  Patient also has history of mechanical aortic valve and follow-up with cardiology.  This was done in 1994.  He remains on long-term Coumadin.  Has dental prophylaxis.  Echocardiogram would be 1800 so he wants to hold off for now.  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   Recommended follow up: Return in about 1 year (around 12/22/2020) for physical or sooner if needed. Future Appointments  Date Time Provider Department Center  01/04/2020  8:15 AM [facility]   Lab/Order associations:non fasting( coffee with cream and sugar)   ICD-10-CM   1. Preventative health care  Z00.00 CBC with Differential/Platelet    Comprehensive metabolic panel    Lipid panel    PSA  2. Hypercholesterolemia  E78.00 CBC with Differential/Platelet    Comprehensive metabolic panel    Lipid panel  3. Ascending aortic aneurysm (HCC)  I71.2   4. Screening for prostate cancer  Z12.5 PSA   Return precautions advised.  Tana Conch, MD

## 2019-12-23 ENCOUNTER — Encounter: Payer: Self-pay | Admitting: Family Medicine

## 2019-12-23 ENCOUNTER — Ambulatory Visit (INDEPENDENT_AMBULATORY_CARE_PROVIDER_SITE_OTHER): Payer: BC Managed Care – PPO | Admitting: Family Medicine

## 2019-12-23 ENCOUNTER — Other Ambulatory Visit: Payer: Self-pay

## 2019-12-23 ENCOUNTER — Other Ambulatory Visit: Payer: Self-pay | Admitting: *Deleted

## 2019-12-23 VITALS — BP 122/80 | HR 69 | Temp 97.2°F | Resp 18 | Ht 69.0 in | Wt 158.8 lb

## 2019-12-23 DIAGNOSIS — Z125 Encounter for screening for malignant neoplasm of prostate: Secondary | ICD-10-CM

## 2019-12-23 DIAGNOSIS — E7800 Pure hypercholesterolemia, unspecified: Secondary | ICD-10-CM

## 2019-12-23 DIAGNOSIS — Z0000 Encounter for general adult medical examination without abnormal findings: Secondary | ICD-10-CM

## 2019-12-23 DIAGNOSIS — I712 Thoracic aortic aneurysm, without rupture: Secondary | ICD-10-CM

## 2019-12-23 DIAGNOSIS — I7121 Aneurysm of the ascending aorta, without rupture: Secondary | ICD-10-CM

## 2019-12-23 DIAGNOSIS — D689 Coagulation defect, unspecified: Secondary | ICD-10-CM | POA: Insufficient documentation

## 2019-12-23 NOTE — Patient Instructions (Addendum)
Health Maintenance Due  Topic Date Due  . INFLUENZA VACCINE In office flu shot today. 08/08/2019  . COVID-19 Vaccine (3 - Booster for Moderna series) On the fence about receiving his booster due to the reaction he had. I still think it would be reasonable to move forward with this if you become more comfortable.  12/09/2019   Please stop by lab before you go If you have mychart- we will send your results within 3 business days of [place] receiving them.  If you do not have mychart- we will call you about results within 5 business days of [place] receiving them.  *please note we are currently using [facility] which has a longer processing time than [facility] typically so labs may not come back as quickly as in the past *please also note that you will see labs on mychart as soon as they post. I will later go in and write notes on them- will say "notes from Dr. Durene Cal"  Recommended follow up: Return in about 1 year (around 12/22/2020) for physical or sooner if needed.

## 2019-12-24 LAB — CBC WITH DIFFERENTIAL/PLATELET
Absolute Monocytes: 433 cells/uL (ref 200–950)
Basophils Absolute: 61 cells/uL (ref 0–200)
Basophils Relative: 1 %
Eosinophils Absolute: 232 cells/uL (ref 15–500)
Eosinophils Relative: 3.8 %
HCT: 46.6 % (ref 38.5–50.0)
Hemoglobin: 16 g/dL (ref 13.2–17.1)
Lymphs Abs: 1574 cells/uL (ref 850–3900)
MCH: 30.5 pg (ref 27.0–33.0)
MCHC: 34.3 g/dL (ref 32.0–36.0)
MCV: 88.9 fL (ref 80.0–100.0)
MPV: 10.3 fL (ref 7.5–12.5)
Monocytes Relative: 7.1 %
Neutro Abs: 3800 cells/uL (ref 1500–7800)
Neutrophils Relative %: 62.3 %
Platelets: 215 10*3/uL (ref 140–400)
RBC: 5.24 10*6/uL (ref 4.20–5.80)
RDW: 12.1 % (ref 11.0–15.0)
Total Lymphocyte: 25.8 %
WBC: 6.1 10*3/uL (ref 3.8–10.8)

## 2019-12-24 LAB — LIPID PANEL
Cholesterol: 152 mg/dL (ref <200)
HDL: 55 mg/dL (ref >=40)
LDL Cholesterol (Calc): 81 mg/dL (calc)
Non-HDL Cholesterol (Calc): 97 mg/dL (calc) (ref <130)
Total CHOL/HDL Ratio: 2.8 (calc) (ref <5.0)
Triglycerides: 81 mg/dL (ref <150)

## 2019-12-24 LAB — COMPREHENSIVE METABOLIC PANEL
AG Ratio: 2.3 (calc) (ref 1.0–2.5)
ALT: 23 U/L (ref 9–46)
AST: 26 U/L (ref 10–35)
Albumin: 4.6 g/dL (ref 3.6–5.1)
Alkaline phosphatase (APISO): 62 U/L (ref 35–144)
BUN: 10 mg/dL (ref 7–25)
CO2: 30 mmol/L (ref 20–32)
Calcium: 9.5 mg/dL (ref 8.6–10.3)
Chloride: 106 mmol/L (ref 98–110)
Creat: 0.96 mg/dL (ref 0.70–1.25)
Globulin: 2 g/dL (calc) (ref 1.9–3.7)
Glucose, Bld: 80 mg/dL (ref 65–99)
Potassium: 3.8 mmol/L (ref 3.5–5.3)
Sodium: 141 mmol/L (ref 135–146)
Total Bilirubin: 0.6 mg/dL (ref 0.2–1.2)
Total Protein: 6.6 g/dL (ref 6.1–8.1)

## 2019-12-24 LAB — PSA: PSA: 1.25 ng/mL (ref <=4.0)

## 2019-12-30 DIAGNOSIS — H2513 Age-related nuclear cataract, bilateral: Secondary | ICD-10-CM | POA: Diagnosis not present

## 2020-01-04 ENCOUNTER — Other Ambulatory Visit: Payer: Self-pay

## 2020-01-04 ENCOUNTER — Ambulatory Visit (INDEPENDENT_AMBULATORY_CARE_PROVIDER_SITE_OTHER): Payer: BC Managed Care – PPO | Admitting: *Deleted

## 2020-01-04 DIAGNOSIS — Z952 Presence of prosthetic heart valve: Secondary | ICD-10-CM

## 2020-01-04 DIAGNOSIS — Z5181 Encounter for therapeutic drug level monitoring: Secondary | ICD-10-CM | POA: Diagnosis not present

## 2020-01-04 LAB — POCT INR: INR: 3.1 — AB (ref 2.0–3.0)

## 2020-01-04 NOTE — Patient Instructions (Addendum)
  Description   Today take 1 tablet since missed a dose then continue taking 1/2 tablet daily except 1 tablet on Mondays, Wednesdays and Fridays. Recheck INR in 6 weeks. Call us with any medications Changes or concerns # [phone]

## 2020-01-24 ENCOUNTER — Other Ambulatory Visit: Payer: Self-pay | Admitting: Cardiothoracic Surgery

## 2020-01-24 DIAGNOSIS — I712 Thoracic aortic aneurysm, without rupture, unspecified: Secondary | ICD-10-CM

## 2020-02-04 ENCOUNTER — Telehealth: Payer: BC Managed Care – PPO | Admitting: Physician Assistant

## 2020-02-04 NOTE — Progress Notes (Unsigned)
Virtual Visit via Video   I connected with Ryan Lamb on 02/04/20 at  3:00 PM EST by a video enabled telemedicine application and verified that I am speaking with the correct person using two identifiers. Location patient: Home Location provider: [facility], Office Persons participating in the virtual visit: Ryan Lamb, Jarold Motto PA-C, ***Corky Mull, LPN   I discussed the limitations of evaluation and management by telemedicine and the availability of in person appointments. The patient expressed understanding and agreed to proceed.  SCRIBE ATTESTATION  Subjective:   HPI:   Patient is requesting evaluation for possible COVID-19.  Symptom onset: ***  Travel/contacts: ***   Vaccination status: ***  Testing results: ***  Patient endorses the following symptoms: ***  Patient denies the following symptoms: ***  Treatments tried: {Treatments; cough:31099}  Patient risk factors: Current COVID-19 risk of complications score: *** Smoking status: Ryan Lamb  reports that he quit smoking about 42 years ago. He has a 2.50 pack-year smoking history. He has never used smokeless tobacco. If male, currently pregnant? []   Yes []   No  ROS: See pertinent positives and negatives per HPI.  Patient Active Problem List   Diagnosis Date Noted  . Acquired coagulation disorder (HCC) 12/23/2019  . History of adenomatous polyp of colon 12/11/2015  . Encounter for therapeutic drug monitoring 12/08/2015  . GERD (gastroesophageal reflux disease) 04/22/2011  . Ascending aortic aneurysm (HCC) 10/18/2010  . Long term (current) use of anticoagulants 05/08/2010  . Hx of mechanical aortic valve replacement   . Hypercholesterolemia     Social History   Tobacco Use  . Smoking status: Former Smoker    Packs/day: 0.50    Years: 5.00    Pack years: 2.50    Quit date: 10/17/1977    Years since quitting: 42.3  . Smokeless tobacco:  Never Used  Substance Use Topics  . Alcohol use: No    Current Outpatient Medications:  07/08/2010  MULTIPLE VITAMIN PO, Take 1 tablet by mouth daily. , Disp: , Rfl:  .  pantoprazole (PROTONIX) 20 MG tablet, Take 1 tablet (20 mg total) by mouth daily as needed for heartburn or indigestion., Disp: 30 tablet, Rfl: 1 .  rosuvastatin (CRESTOR) 20 MG tablet, Take 1 tablet (20 mg total) by mouth daily., Disp: 90 tablet, Rfl: 3 .  warfarin (COUMADIN) 5 MG tablet, TAKE 1/2 TO 1 TABLET BY MOUTH DAILY AS DIRECTED BY [facility]IN CLINIC, Disp: 70 tablet, Rfl: 1  Allergies  Allergen Reactions  . Quinolones     Patient was warned about not using Cipro and similar antibiotics. Recent studies have raised concern that fluoroquinolone antibiotics could be associated with an increased risk of aortic aneurysm Fluoroquinolones have non-antimicrobial properties that might jeopardise the integrity of the extracellular matrix of the vascular wall In a  propensity score matched cohort study in 12/17/1977, there was a 66% increased rate of aortic aneurysm or dissection associated with oral fluoroquinolone use, compared wit  . Penicillins Rash    Reaction unknown may have been a rash    Objective:   VITALS: Per patient if applicable, see vitals. GENERAL: Alert, appears well and in no acute distress. HEENT: Atraumatic, conjunctiva clear, no obvious abnormalities on inspection of external nose and ears. NECK: Normal movements of the head and neck. CARDIOPULMONARY: No increased WOB. Speaking in clear sentences. I:E ratio WNL.  MS: Moves all visible extremities without noticeable abnormality. PSYCH: Pleasant and cooperative, well-groomed. Speech normal rate and  rhythm. Affect is appropriate. Insight and judgement are appropriate. Attention is focused, linear, and appropriate.  NEURO: CN grossly intact. Oriented as arrived to appointment on time with no prompting. Moves both UE equally.  SKIN: No obvious lesions, wounds, erythema,  or cyanosis noted on face or hands.  Assessment and Plan:   There are no diagnoses linked to this encounter.  No red flags on discussion, patient is not in any obvious distress during our visit. Discussed progression of most viral illnesses, and recommended supportive care at this point in time.  I did however provide pocket rx for oral *** should symptoms not improve as anticipated. Discussed over the counter supportive care options, including Tylenol 500 mg q 8 hours, with recommendations to push fluids and rest. Reviewed return precautions including new/worsening fever, SOB, new/worsening cough, sudden onset changes of symptoms. Recommended need to self-quarantine and practice social distancing until symptoms resolve. I recommend that patient follow-up if symptoms worsen or persist despite treatment x 7-10 days, sooner if needed.  I discussed the assessment and treatment plan with the patient. The patient was provided an opportunity to ask questions and all were answered. The patient agreed with the plan and demonstrated an understanding of the instructions.   The patient was advised to call back or seek an in-person evaluation if the symptoms worsen or if the condition fails to improve as anticipated.   ***  Corky Mull, LPN 07/03/9483

## 2020-02-15 ENCOUNTER — Other Ambulatory Visit: Payer: Self-pay

## 2020-02-15 ENCOUNTER — Ambulatory Visit (INDEPENDENT_AMBULATORY_CARE_PROVIDER_SITE_OTHER): Payer: BC Managed Care – PPO | Admitting: *Deleted

## 2020-02-15 DIAGNOSIS — Z5181 Encounter for therapeutic drug level monitoring: Secondary | ICD-10-CM

## 2020-02-15 DIAGNOSIS — Z952 Presence of prosthetic heart valve: Secondary | ICD-10-CM

## 2020-02-15 LAB — POCT INR: INR: 2.2 (ref 2.0–3.0)

## 2020-02-15 NOTE — Patient Instructions (Signed)
Description   Today take 1 tablet since missed a your dose then continue taking 1/2 tablet daily except 1 tablet on Mondays, Wednesdays and Fridays. Recheck INR in 5 weeks. Call us with any medications Changes or concerns # [phone]

## 2020-03-09 ENCOUNTER — Ambulatory Visit
Admission: RE | Admit: 2020-03-09 | Discharge: 2020-03-09 | Disposition: A | Discharge status: Home / Self Care | Payer: BC Managed Care – PPO | Source: Ambulatory Visit | Attending: Cardiothoracic Surgery | Admitting: Cardiothoracic Surgery

## 2020-03-09 ENCOUNTER — Other Ambulatory Visit: Payer: Self-pay

## 2020-03-09 ENCOUNTER — Encounter: Payer: Self-pay | Admitting: Cardiothoracic Surgery

## 2020-03-09 ENCOUNTER — Ambulatory Visit: Payer: BC Managed Care – PPO | Admitting: Cardiothoracic Surgery

## 2020-03-09 VITALS — BP 119/80 | HR 82 | Resp 20 | Ht 69.0 in | Wt 159.0 lb

## 2020-03-09 DIAGNOSIS — R911 Solitary pulmonary nodule: Secondary | ICD-10-CM | POA: Diagnosis not present

## 2020-03-09 DIAGNOSIS — I2510 Atherosclerotic heart disease of native coronary artery without angina pectoris: Secondary | ICD-10-CM | POA: Diagnosis not present

## 2020-03-09 DIAGNOSIS — I712 Thoracic aortic aneurysm, without rupture, unspecified: Secondary | ICD-10-CM

## 2020-03-09 DIAGNOSIS — I700 Atherosclerosis of aorta: Secondary | ICD-10-CM | POA: Diagnosis not present

## 2020-03-09 MED ORDER — IOPAMIDOL (ISOVUE-370) INJECTION 76%
75.0000 mL | Freq: Once | INTRAVENOUS | Status: AC | PRN
  Administered 2020-03-09: 75 mL via INTRAVENOUS

## 2020-03-09 NOTE — Progress Notes (Signed)
[address]       [place] [id number]             [phone]         Sullivan LoneHarold Edson Guerry MinorsHertzfeld III [facility] Medical Record #[id number] Date of Birth: 1955-08-05  Referring: Shelva MajesticHunter, Stephen O, MD Primary Care: Shelva MajesticHunter, Stephen O, MD Primary cardiologist:   Chief Complaint:    Chief Complaint  Patient presents with  . Thoracic Aortic Aneurysm    Yearly f/u with CTA chest today    History of Present Illness:     Patient returns today with a yearly follow-up CTA of the chest.  In 1971 the patient had aortic valvuloplasty. Subsequently in 1994 I replaced his aortic valve with a #23 [employer] mechanical prosthesis. At the time of surgery   4/20/ 1994 He was noted to have a tri leaflet aortic valve.  In December of 2011 because of a chest x-ray suggestive of dilatation of his descending aorta he was seen again and has had CT scans of the chest  yearly since 2011.   He has no family history of connective tissue or Marfan's disorder.   He  follows his blood pressure carefully and has not been hypertensive. He continues to follow endocarditis precautions including with dental work. with dental work. He had echocardiogram and nuclear stress test done in past. Most recent echo 08/10/2015 .  He has been he has been diligent about Coumadin follow-up and had very little problems with long-term Coumadin use.  Current Activity/ Functional Status: Patient remains active without physical limitations   Past Medical History  Diagnosis Date  . Aortic stenosis as infant, with recurrent as and valve replacement   . Aortic valve prosthesis present   . Hypercholesterolemia     Past Surgical History  Procedure Date  . Cardiac valve replacement- mechanical aortic valve  1994     There is no family history of Marfan's or other connective tissue disorder or sudden death from dissection . Patient's father is alive at age 64, had bypass surgery primary several  years ago his mother is 64 years old had straight aortic tissue  valve replacement by Dr. Virgel GessBartell-   History   Social History  . Marital Status: Single    Spouse Name: N/A    Number of Children: N/A  . Years of Education: N/A   Occupational History  . Not on file.   Social History Main Topics  . Smoking status: Former Smoker -- 0.5 packs/day for 5 years    Quit date: 10/17/1977  . Smokeless tobacco: Never Used  . Alcohol Use: No  . Drug Use: No              Social History Narrative  . No narrative on file    Social History   Tobacco Use  Smoking Status Former Smoker  . Packs/day: 0.50  . Years: 5.00  . Pack years: 2.50  . Quit date: 10/17/1977  . Years since quitting: 42.4  Smokeless Tobacco Never Used    Social History   Substance and Sexual Activity  Alcohol Use No     Allergies  Allergen Reactions  . Quinolones     Patient was warned about not using Cipro and similar antibiotics. Recent studies have raised concern that fluoroquinolone antibiotics could be associated with an increased risk of aortic aneurysm Fluoroquinolones  have non-antimicrobial properties that might jeopardise the integrity of the extracellular matrix of the vascular wall In a  propensity score matched cohort study in [place], there was a 66% increased rate of aortic aneurysm or dissection associated with oral fluoroquinolone use, compared wit  . Penicillins Rash    Reaction unknown may have been a rash    Current Outpatient Medications  Medication Sig Dispense Refill  . MULTIPLE VITAMIN PO Take 1 tablet by mouth daily.     . pantoprazole (PROTONIX) 20 MG tablet Take 1 tablet (20 mg total) by mouth daily as needed for heartburn or indigestion. 30 tablet 1  . rosuvastatin (CRESTOR) 20 MG tablet Take 1 tablet (20 mg total) by mouth daily. 90 tablet 3  . warfarin (COUMADIN) 5 MG tablet TAKE 1/2 TO 1 TABLET BY MOUTH DAILY AS DIRECTED BY COUMADIN CLINIC 70 tablet 1   No current  facility-administered medications for this visit.       Physical Exam: BP 119/80 (BP Location: Left Arm, Patient Position: Sitting)   Pulse 82   Resp 20   Ht  (1.753 m)   Wt 159 lb (72.1 kg)   SpO2 98% Comment: RA  BMI 23.48 kg/m  General appearance: alert, cooperative, appears stated age and no distress Head: Normocephalic, without obvious abnormality, atraumatic Neck: no adenopathy, no carotid bruit, no JVD, supple, symmetrical, trachea midline and thyroid not enlarged, symmetric, no tenderness/mass/nodules Lymph nodes: Cervical, supraclavicular, and axillary nodes normal. Resp: clear to auscultation bilaterally Cardio: regular rate and rhythm, S1, S2 normal, no murmur, click, rub or gallop and click of mechial valve noted no ai  GI: soft, non-tender; bowel sounds normal; no masses,  no organomegaly Extremities: extremities normal, atraumatic, no cyanosis or edema and Homans sign is negative, no sign of DVT Neurologic: Grossly normal  Diagnostic Studies & Laboratory data:     Recent Radiology Findings: CT ANGIO CHEST AORTA W/CM & OR WO/CM  Result Date: 03/09/2020 CLINICAL DATA:  Thoracic aorta aneurysm. EXAM: CT ANGIOGRAPHY CHEST WITH CONTRAST TECHNIQUE: Multidetector CT imaging of the chest was performed using the standard protocol during bolus administration of intravenous contrast. Multiplanar CT image reconstructions and MIPs were obtained to evaluate the vascular anatomy. CONTRAST:  75mL ISOVUE-370 IOPAMIDOL (ISOVUE-370) INJECTION 76% COMPARISON:  03/04/2019 FINDINGS: Cardiovascular: Aneurysmal dilatation of the ascending thoracic aorta measuring up to 4.7 cm. Mild thoracic aorta atherosclerosis. Normal heart size. Stable small aneurysm or pseudoaneurysm at the apex of the left ventricle. Aortic valve replacement. No pericardial effusion. Coronary artery calcification. Main pulmonary artery is normal in caliber. There is no central pulmonary embolus. Mediastinum/Nodes: No  enlarged lymph nodes. Lungs/Pleura: Stable 6 mm nodule of the medial left upper lobe (series 7, image 66). No consolidation. No pleural effusion. Upper Abdomen: No acute abnormality. Musculoskeletal: No significant or acute osseous abnormality. Prior sternotomy. Review of the MIP images confirms the above findings. 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 Stable 6 mm left upper lobe nodule. Electronically Signed   By: Guadlupe Spanish M.D.   On: 03/09/2020 12:11   I have independently reviewed the above radiology studies  and reviewed the findings with the patient.   Ct Angio Chest Aorta W/cm &/or Wo/cm  Result Date: 02/26/2018 CLINICAL DATA:  65 year old male with a history of thoracic aortic aneurysm EXAM: CT ANGIOGRAPHY CHEST WITH CONTRAST TECHNIQUE: Multidetector CT imaging of the chest was performed using the standard protocol during bolus administration of intravenous contrast. Multiplanar CT image reconstructions and MIPs were  obtained to evaluate the vascular anatomy. CONTRAST:  27mL ISOVUE-370 IOPAMIDOL (ISOVUE-370) INJECTION 76% COMPARISON:  Most remote 12/11/2009 with multiple prior most recent 03/06/2017 FINDINGS: Cardiovascular: Heart: Heart size unchanged with no cardiomegaly. No pericardial fluid/thickening. Surgical changes of prior aortic valve repair. 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 Calcifications of the LAD. Aorta: My content per any is measurement of the aorta on the CT 12/11/2009 measures 4.7 cm in the greatest axial dimension of the ascending aorta. My measurement made today of the most recent CT comparison of 03/06/2017 is 4.8 cm. No periaortic fluid or inflammatory changes. No dissection. Minimal atherosclerosis of the aortic arch. Three vessel arch. Descending thoracic aorta unremarkable with no aneurysm or significant atherosclerosis. Pulmonary arteries: Contrast bolus timing is not optimized for the pulmonary arteries.  Unremarkable caliber of the proximal pulmonary arteries. Mediastinum/Nodes: No mediastinal adenopathy. Unremarkable thoracic esophagus. Unremarkable thoracic inlet.  Surgical changes of median sternotomy. Lungs/Pleura: Central airways are clear. No pleural effusion. No confluent airspace disease. No pneumothorax. Redemonstration of 6 mm nodule of the left upper lobe on image 70 of series 4. This is unchanged from the most recent comparison CT. This nodule was present on the most remote CT of 12/11/2009, measuring only 2 mm or 3 mm at that time. Upper Abdomen: No acute. Musculoskeletal: No acute displaced fracture. Degenerative changes of the spine. Review of the MIP images confirms the above findings. IMPRESSION: Redemonstration of ascending aortic aneurysm which measures 4.8 cm on today's CT. The diameter was 4.7 cm on the most remote CT of 12/11/2009. Aortic aneurysm NOS (ICD10-I71.9). Redemonstration of surgical changes of median sternotomy and aortic valve replacement. Redemonstration of 6 mm nodule of the left upper lobe. This is unchanged from the most recent comparison CT. This nodule appears to have been present in 2011, with slow interval growth. Attention on follow-up CT scans recommended. If/when the nodule approaches 8 mm-9 mm, a PET-CT may be useful. Aortic Atherosclerosis (ICD10-I70.0). Associated coronary artery disease. 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 Signed, Yvone Neu. Reyne Dumas, RPVI [facility] Electronically Signed   By: Gilmer Mor D.O.   On: 02/26/2018 13:55    Ct Angio Chest Aorta W/cm &/or Wo/cm  Result Date: 03/06/2017 CLINICAL DATA:  History of aortic valve replacement and dilatation of the ascending thoracic aorta. EXAM: CT ANGIOGRAPHY CHEST WITH CONTRAST TECHNIQUE: Multidetector CT imaging of the chest was performed using the standard protocol during bolus administration of intravenous  contrast. Multiplanar CT image reconstructions and MIPs were obtained to evaluate the vascular anatomy. CONTRAST:  71mL ISOVUE-370 IOPAMIDOL (ISOVUE-370) INJECTION 76% Creatinine was obtained on site at [facility] at [address] Results: BUN 12, creatinine 0.9 mg/dL and estimated GFR 93 mL/minute COMPARISON:  12/28/2015 as well as additional annual CTA studies dating back to 2011. FINDINGS: Cardiovascular: Stable dilatation of the ascending thoracic aorta which measures approximately 4.8 cm in maximum diameter. The aortic root at the level of the sinuses of Valsalva measures approximately 3.7 cm. The proximal arch measures 3.7 cm. The distal arch measures 2.8 cm. The descending thoracic aorta measures 2.3 cm. Proximal great vessels show stable and normal patency. The heart size is stable. There is a stable small aneurysm at the left ventricular apex measuring roughly 8 x 9 mm. No pericardial fluid identified. Stable coronary atherosclerosis with calcified plaque in the distribution of the LAD. CT appearance of the prosthetic aortic valve is unremarkable. Central pulmonary arteries  are normal in caliber. Mediastinum/Nodes: No enlarged mediastinal, hilar, or axillary lymph nodes. Thyroid gland, trachea, and esophagus demonstrate no significant findings. Lungs/Pleura: Well-circumscribed noncalcified pulmonary nodule in the anterior and medial aspect of the lingula again noted which currently measures approximately 5 x 6 mm. This nodule has very slowly enlarged over time, measuring approximately 3 mm in 2011 and 2012, 4 mm in 2013, 5 mm in 2015 and 5 mm in 2017. No other pulmonary nodules are identified. There is no evidence of pulmonary edema, consolidation, pneumothorax or pleural fluid. Upper Abdomen: No acute abnormality. Musculoskeletal: No chest wall abnormality. No acute or significant osseous findings. Review of the MIP images confirms the above findings. IMPRESSION: 1. Stable dilatation of the  ascending thoracic aorta which measures approximately 4.8 cm in maximum diameter. 2. Very slowly enlarging solitary lingular lung nodule which measures 6 mm in greatest diameter currently and measured 3 mm in 2011 and 2012. This continues to warrant follow-up. The nodule is likely still too small to be accurately assessed by PET scan. 3. 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 4. Stable coronary atherosclerosis with calcified plaque in the distribution of the LAD. Aortic aneurysm NOS (ICD10-I71.9). Electronically Signed   By: Irish Lack M.D.   On: 03/06/2017 11:46    Ct Angio Chest Aorta W &/or Wo Contrast  Result Date: 12/28/2015 CLINICAL DATA:  Ascending aortic aneurysm.  Follow-up. EXAM: CT ANGIOGRAPHY CHEST WITH CONTRAST TECHNIQUE: Multidetector CT imaging of the chest was performed using the standard protocol during bolus administration of intravenous contrast. Multiplanar CT image reconstructions and MIPs were obtained to evaluate the vascular anatomy. CONTRAST:  75 mL Isovue 370 COMPARISON:  12/29/2014 FINDINGS: Cardiovascular: Unchanged ascending aortic aneurysm with maximal diameter of 4.8 cm. The aorta measures 4.6 cm at the sinuses of Valsalva and 3.2 cm at the sinotubular junction, the proximal transverse aorta measures 3.8 cm, distal transverse aorta measures 2.6 cm, proximal descending thoracic aorta measures 2.4 cm, and distal descending thoracic aorta measures 2.0 cm just above the hiatus, all not significantly changed when measured at a comparable location on the prior study. Prosthetic aortic valve. No central pulmonary embolus on this nondedicated study. LAD and left circumflex coronary artery calcification. No pericardial effusion. Mediastinum/Nodes: No enlarged axillary, mediastinal, or hilar lymph nodes. Unremarkable thyroid, trachea, and esophagus. Lungs/Pleura: No pleural effusion or pneumothorax. 5 mm medial left upper lobe  nodule has not significantly changed in size allowing for differences in slice selection (series 5, image 58). No new nodules. Minimal right middle lobe scarring. Upper Abdomen: Unchanged 2.3 cm left upper pole renal cyst. Musculoskeletal: Bilateral gynecomastia. No acute osseous abnormality or suspicious osseous lesion. Review of the MIP images confirms the above findings. IMPRESSION: Unchanged 4.8 cm ascending aortic aneurysm. Electronically Signed   By: Sebastian Ache M.D.   On: 12/28/2015 12:20   Ct Temporal Bones Wo Contrast  Result Date: 12/18/2015 CLINICAL DATA:  RIGHT greater than LEFT chronic tinnitus, worsening for 4 months. History of multiple treated ear infections. EXAM: CT TEMPORAL BONES WITHOUT CONTRAST TECHNIQUE: Axial and coronal plane CT imaging of the petrous temporal bones was performed with thin-collimation image reconstruction. No intravenous contrast was administered. Multiplanar CT image reconstructions were also generated. COMPARISON:  None. FINDINGS: RIGHT: External auditory canal is well formed and well aerated. Tympanic membrane is not thickened or retracted. The scutum is sharp. Well aerated middle ear including Prussak's space. Ossicles are well formed and located. Patent aditus ad  antrum. Well aerated mastoid air cells without coalescence. Intact tegmen tympani. Intact otic capsule with normal appearance of the inner ear structures. No internal auditory canal expansion. No definite cerebellar pontine angle masses. Mild RIGHT temporomandibular osteoarthrosis, pneumatized pedis apices with bony dehiscence RIGHT glenoid. LEFT: External auditory canal is well formed and aerated. Tympanic membrane is not thickened or retracted. The scutum is sharp. Well aerated middle ear including Prussak's space. Ossicles are well formed and located. Patent aditus ad antrum. Well aerated mastoid air cells without coalescence. Intact tegmen tympani. Intact otic capsule with normal appearance of the  inner ear structures. No internal auditory canal expansion. No definite cerebellar pontine angle masses. Mild calcific atherosclerosis the carotid siphons. IMPRESSION: Bony dehiscence RIGHT temporomandibular joint/glenoid fossa. Otherwise negative bilateral temporal bone CT. Electronically Signed   By: Awilda Metro M.D.   On: 12/18/2015 16:57   Ct Angio Chest Aorta W/cm &/or Wo/cm  10/28/2013   CLINICAL DATA:  Ascending thoracic aortic aneurysm, followup, personal history of cardiac valve replacement, cardiac surgery, quit smoking 30 years ago  EXAM: CT ANGIOGRAPHY CHEST WITH CONTRAST  TECHNIQUE: Multidetector CT imaging of the chest was performed using the standard protocol during bolus administration of intravenous contrast. Multiplanar CT image reconstructions and MIPs were obtained to evaluate the vascular anatomy.  CONTRAST:  65mL OMNIPAQUE IOHEXOL 350 MG/ML SOLN  COMPARISON:  10/08/2012, 09/12/2011  FINDINGS: Replacement aortic valve noted. Aortic root is dilated to 4.4 cm transverse diameter as it was previously. Mid ascending aorta is distended to 4.8 cm, unchanged. Proximal aortic arch is 3.8 cm, not significantly different from prior study. Distal aortic arch is 2.8 cm, stable. Proximal descending aorta is 2.6 cm, stable. Distal descending aorta is 2.1 cm, stable. No evidence of dissection or stenosis. The origins of the great vessels all appear normal. No significant calcified atherosclerotic plaque identified.  There is calcification of the left anterior descending artery. There is no significant pleural or pericardial effusion. Patient is status post previous median sternotomy. No significant hilar or mediastinal adenopathy.  4 mm pulmonary nodule image number 20 in the medial lingula is stable from 09/12/2011 and therefore considered benign. Stable cyst upper pole left kidney. No acute musculoskeletal findings.  Review of the MIP images confirms the above findings.  IMPRESSION: No change in  ascending aortic aneurysm to a maximal diameter of 4.8 cm.   Electronically Signed   By: Esperanza Heir M.D.   On: 10/28/2013 09:30   Ct Angio Chest Aorta W/cm &/or Wo/cm  10/08/2012   CLINICAL DATA:  Thoracic aortic aneurysm, aortic stenosis, post mechanical prosthesis placement.  EXAM: CT ANGIOGRAPHY OF CHEST  TECHNIQUE: Multidetector CT imaging of the chest was performed during bolus injection of intravenous contrast. Multiplanar CT angiographic image reconstructions including MIPs were also generated to evaluate the vascular anatomy.  CONTRAST:  OMNIPAQUE IOHEXOL 350 MG/ML SOLN  COMPARISON:  09/12/2011  FINDINGS: Stable appearance of prosthetic aortic valve. Postop change of the aortic root which is ectatic measuring 4.4 cm maximum transverse diameter. Proximal ascending 4.8 cm, distal ascending 4.7 cm, proximal arch 3.7 cm, distal arch 2.8 cm, proximal descending 2.6 cm, and distal descending 2.1 cm just above the diaphragm. Negative for dissection or stenosis. No significant atheromatous irregularity. 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 There is fairly good contrast opacification of the pulmonary arterial tree; exam was not optimized for detection of pulmonary emboli.  Previous median sternotomy. No pleural or pericardial effusion. Patchy coronary calcifications. No  hilar or mediastinal adenopathy. Lungs are clear. Stable cyst In the upper pole left kidney. Remainder visualized upper abdomen unremarkable. thoracic spine intact.  IMPRESSION: IMPRESSION  1. Stable 4.8 cm ascending aortic aneurysm without dissection or other complicating features   Electronically Signed   By: Oley Balm M.D.   On: 10/08/2012 14:37    Recent Lab Findings: Lab Results  Component Value Date   WBC 6.1 12/23/2019   HGB 16.0 12/23/2019   HCT 46.6 12/23/2019   PLT 215 12/23/2019   GLUCOSE 80 12/23/2019   CHOL 152 12/23/2019   TRIG 81 12/23/2019   HDL 55  12/23/2019   LDLCALC 81 12/23/2019   ALT 23 12/23/2019   AST 26 12/23/2019   NA 141 12/23/2019   K 3.8 12/23/2019   CL 106 12/23/2019   CREATININE 0.96 12/23/2019   BUN 10 12/23/2019   CO2 30 12/23/2019   TSH 1.53 12/15/2017   INR 2.2 02/15/2020   Aortic Size Index=     4.8  /Body surface area is 1.87 meters squared. =2.56  < 2.75 cm/m2      4% risk per year 2.75 to 4.25          8% risk per year > 4.25 cm/m2    20% risk per year    Assessment / Plan:   1/ No change in 4.8   ascending aortic dilatation since 2011 cm in patient with previous mechanic aortic valve replacement in 04/1992 There's been no appreciable change in the size of the aorta since his scan in 2011. I discussed the diagnosis with the patient and we have decided to continue to monitor the size of his aorta and will obtain a followup CT scan in one year.   2/ unchanged  6 mm in greatest diameter lingular nodule  lung ,  currently and unchanged from last year ,  measured 3 mm in 2011 and 2012.   Plan follow-up CTA of the chest 1 year  Delight Ovens MD  Beeper [phone] Office [phone] 03/09/2020 1:17 PM

## 2020-03-21 ENCOUNTER — Other Ambulatory Visit: Payer: Self-pay

## 2020-03-21 ENCOUNTER — Ambulatory Visit (INDEPENDENT_AMBULATORY_CARE_PROVIDER_SITE_OTHER): Payer: BC Managed Care – PPO

## 2020-03-21 DIAGNOSIS — Z952 Presence of prosthetic heart valve: Secondary | ICD-10-CM

## 2020-03-21 DIAGNOSIS — Z5181 Encounter for therapeutic drug level monitoring: Secondary | ICD-10-CM | POA: Diagnosis not present

## 2020-03-21 LAB — POCT INR: INR: 3.1 — AB (ref 2.0–3.0)

## 2020-03-21 NOTE — Patient Instructions (Signed)
Description   Continue on same dosage 1/2 tablet daily except 1 tablet on Mondays, Wednesdays and Fridays. Recheck INR in 6 weeks. Call us with any medications Changes or concerns # [phone]

## 2020-04-17 ENCOUNTER — Telehealth (INDEPENDENT_AMBULATORY_CARE_PROVIDER_SITE_OTHER): Payer: BC Managed Care – PPO | Admitting: Family Medicine

## 2020-04-17 ENCOUNTER — Other Ambulatory Visit: Payer: Self-pay

## 2020-04-17 DIAGNOSIS — J111 Influenza due to unidentified influenza virus with other respiratory manifestations: Secondary | ICD-10-CM | POA: Diagnosis not present

## 2020-04-17 MED ORDER — OSELTAMIVIR PHOSPHATE 75 MG PO CAPS: 75.0000 mg | ORAL_CAPSULE | Freq: Two times a day (BID) | ORAL | 0 refills | Status: DC

## 2020-04-17 NOTE — Progress Notes (Signed)
Patient ID: [facility], male   DOB: 1955/08/08, 65 y.o.   MRN: [id number]  This visit type was conducted due to national recommendations for restrictions regarding the COVID-19 pandemic in an effort to limit this patient's exposure and mitigate transmission in our community.   Virtual Visit via Video Note  I connected with Ryan Lamb on 04/17/20 at  3:00 PM EDT by a video enabled telemedicine application and verified that I am speaking with the correct person using two identifiers.  Location patient: home Location provider:work or home office Persons participating in the virtual visit: patient, provider  I discussed the limitations of evaluation and management by telemedicine and the availability of in person appointments. The patient expressed understanding and agreed to proceed.   HPI:  Patient relates onset of fever, sore throat, cough, mild headaches, body aches last Saturday.  He states last Thursday his father was diagnosed with influenza at [facility].  He had taken his father there.  His mom subsequently developed influenza as well.  Patient had influenza vaccine back in the fall.  He did home Covid test which came back negative.  No nausea or vomiting.  Feels slightly better today but still having low-grade fever and cough and malaise.  He has mechanical aortic valve and is on Coumadin.  Hyperlipidemia treated with Crestor.   ROS: See pertinent positives and negatives per HPI.  Past Medical History:  Diagnosis Date  . Aortic stenosis   . Aortic valve prosthesis present   . Dissecting aortic aneurysm (any part), thoracic (HCC)   . Diverticulosis   . Gastric ulcer    due to nsaids in past  . Hypercholesterolemia   . Mechanical heart valve present     Past Surgical History:  Procedure Laterality Date  . CARDIAC VALVE REPLACEMENT    . EEC     with propofol   . ESOPHAGOGASTRODUODENOSCOPY  01/2010, 04/2010  . ESOPHAGOGASTRODUODENOSCOPY      cauterize after nsaid related ulcer  . EXPLORATION POST OPERATIVE OPEN HEART  1994, 1972    Family History  Problem Relation Age of Onset  . Coronary artery disease Mother        CABG. 81 in 2019.   Marland Kitchen Coronary artery disease Father        aortic valve replacement  . Skin cancer Father         39 in 2019.   Marland Kitchen Healthy Sister   . Healthy Brother        overweight  . Schizophrenia Sister        home health care  . GI problems Neg Hx     SOCIAL HX: Quit smoking 1979   Current Outpatient Medications:  Marland Kitchen  MULTIPLE VITAMIN PO, Take 1 tablet by mouth daily. , Disp: , Rfl:  .  oseltamivir (TAMIFLU) 75 MG capsule, Take 1 capsule (75 mg total) by mouth 2 (two) times daily., Disp: 10 capsule, Rfl: 0 .  pantoprazole (PROTONIX) 20 MG tablet, Take 1 tablet (20 mg total) by mouth daily as needed for heartburn or indigestion., Disp: 30 tablet, Rfl: 1 .  rosuvastatin (CRESTOR) 20 MG tablet, Take 1 tablet (20 mg total) by mouth daily., Disp: 90 tablet, Rfl: 3 .  warfarin (COUMADIN) 5 MG tablet, TAKE 1/2 TO 1 TABLET BY MOUTH DAILY AS DIRECTED BY COUMADIN CLINIC, Disp: 70 tablet, Rfl: 1  EXAM:  VITALS per patient if applicable:  GENERAL: alert, oriented, appears well and in no acute distress  HEENT:  atraumatic, conjunttiva clear, no obvious abnormalities on inspection of external nose and ears  NECK: normal movements of the head and neck  LUNGS: on inspection no signs of respiratory distress, breathing rate appears normal, no obvious gross SOB, gasping or wheezing  CV: no obvious cyanosis  MS: moves all visible extremities without noticeable abnormality  PSYCH/NEURO: pleasant and cooperative, no obvious depression or anxiety, speech and thought processing grossly intact  ASSESSMENT AND PLAN:  Discussed the following assessment and plan:  Probable acute influenza.  Patient had positive exposure as above and has classic symptoms now.  Nontoxic in appearance  -Tamiflu 75 mg by mouth  twice daily for 5 days -Plenty fluids and rest -Continue Tylenol as needed for body aches and headaches -Follow-up for any persistent or worsening symptoms     I discussed the assessment and treatment plan with the patient. The patient was provided an opportunity to ask questions and all were answered. The patient agreed with the plan and demonstrated an understanding of the instructions.   The patient was advised to call back or seek an in-person evaluation if the symptoms worsen or if the condition fails to improve as anticipated.     Evelena Peat, MD

## 2020-05-02 ENCOUNTER — Ambulatory Visit (INDEPENDENT_AMBULATORY_CARE_PROVIDER_SITE_OTHER): Payer: BC Managed Care – PPO | Admitting: *Deleted

## 2020-05-02 ENCOUNTER — Other Ambulatory Visit: Payer: Self-pay

## 2020-05-02 DIAGNOSIS — Z952 Presence of prosthetic heart valve: Secondary | ICD-10-CM

## 2020-05-02 DIAGNOSIS — Z5181 Encounter for therapeutic drug level monitoring: Secondary | ICD-10-CM

## 2020-05-02 LAB — POCT INR: INR: 4 — AB (ref 2.0–3.0)

## 2020-05-02 NOTE — Patient Instructions (Addendum)
Description   Do not take any Warfarin today then continue on same dosage 1/2 tablet daily except 1 tablet on Mondays, Wednesdays and Fridays. Recheck INR in 4 weeks (normally 6 weeks). Call us with any medications Changes or concerns # [phone]

## 2020-05-14 ENCOUNTER — Other Ambulatory Visit: Payer: Self-pay | Admitting: Cardiology

## 2020-05-30 ENCOUNTER — Ambulatory Visit (INDEPENDENT_AMBULATORY_CARE_PROVIDER_SITE_OTHER): Payer: BC Managed Care – PPO | Admitting: *Deleted

## 2020-05-30 ENCOUNTER — Other Ambulatory Visit: Payer: Self-pay

## 2020-05-30 DIAGNOSIS — Z5181 Encounter for therapeutic drug level monitoring: Secondary | ICD-10-CM

## 2020-05-30 DIAGNOSIS — Z952 Presence of prosthetic heart valve: Secondary | ICD-10-CM | POA: Diagnosis not present

## 2020-05-30 LAB — POCT INR: INR: 3.3 — AB (ref 2.0–3.0)

## 2020-05-30 NOTE — Patient Instructions (Signed)
Description   Continue same dosage 1/2 tablet daily except 1 tablet on Mondays, Wednesdays and Fridays. Recheck INR in 5 weeks. Call us with any medications Changes or concerns # [phone]

## 2020-07-04 ENCOUNTER — Ambulatory Visit (INDEPENDENT_AMBULATORY_CARE_PROVIDER_SITE_OTHER): Payer: BC Managed Care – PPO | Admitting: *Deleted

## 2020-07-04 ENCOUNTER — Other Ambulatory Visit: Payer: Self-pay

## 2020-07-04 DIAGNOSIS — Z952 Presence of prosthetic heart valve: Secondary | ICD-10-CM

## 2020-07-04 DIAGNOSIS — Z5181 Encounter for therapeutic drug level monitoring: Secondary | ICD-10-CM | POA: Diagnosis not present

## 2020-07-04 LAB — POCT INR: INR: 3.2 — AB (ref 2.0–3.0)

## 2020-07-04 NOTE — Patient Instructions (Signed)
Description   Continue taking Warfarin 1/2 tablet daily except 1 tablet on Mondays, Wednesdays and Fridays. Recheck INR in 6 weeks. Call us with any medications Changes or concerns # [phone]

## 2020-08-15 ENCOUNTER — Ambulatory Visit (INDEPENDENT_AMBULATORY_CARE_PROVIDER_SITE_OTHER): Payer: BC Managed Care – PPO | Admitting: *Deleted

## 2020-08-15 ENCOUNTER — Other Ambulatory Visit: Payer: Self-pay

## 2020-08-15 DIAGNOSIS — Z952 Presence of prosthetic heart valve: Secondary | ICD-10-CM

## 2020-08-15 DIAGNOSIS — Z5181 Encounter for therapeutic drug level monitoring: Secondary | ICD-10-CM | POA: Diagnosis not present

## 2020-08-15 LAB — POCT INR: INR: 3.5 — AB (ref 2.0–3.0)

## 2020-08-15 NOTE — Patient Instructions (Signed)
Description   Continue taking Warfarin 1/2 tablet daily except 1 tablet on Mondays, Wednesdays and Fridays. Recheck INR in 6 weeks. Call us with any medications Changes or concerns # [phone]     

## 2020-09-20 ENCOUNTER — Other Ambulatory Visit: Payer: Self-pay | Admitting: Family Medicine

## 2020-09-26 ENCOUNTER — Ambulatory Visit: Payer: BC Managed Care – PPO

## 2020-09-26 ENCOUNTER — Other Ambulatory Visit: Payer: Self-pay

## 2020-09-26 DIAGNOSIS — Z952 Presence of prosthetic heart valve: Secondary | ICD-10-CM | POA: Diagnosis not present

## 2020-09-26 DIAGNOSIS — Z5181 Encounter for therapeutic drug level monitoring: Secondary | ICD-10-CM

## 2020-09-26 LAB — POCT INR: INR: 3.8 — AB (ref 2.0–3.0)

## 2020-09-26 NOTE — Patient Instructions (Signed)
Description   Hold today's dose and then continue taking Warfarin 1/2 tablet daily except 1 tablet on Mondays, Wednesdays and Fridays. Recheck INR in 6 weeks. Call us with any medications Changes or concerns # [phone]

## 2020-11-07 ENCOUNTER — Ambulatory Visit (INDEPENDENT_AMBULATORY_CARE_PROVIDER_SITE_OTHER): Payer: BC Managed Care – PPO

## 2020-11-07 ENCOUNTER — Other Ambulatory Visit: Payer: Self-pay

## 2020-11-07 DIAGNOSIS — Z5181 Encounter for therapeutic drug level monitoring: Secondary | ICD-10-CM

## 2020-11-07 DIAGNOSIS — Z952 Presence of prosthetic heart valve: Secondary | ICD-10-CM

## 2020-11-07 LAB — POCT INR: INR: 1.9 — AB (ref 2.0–3.0)

## 2020-11-07 NOTE — Patient Instructions (Signed)
Description   Take 1.5 tablets today, then resume same dosage of Warfarin 1/2 tablet daily except 1 tablet on Mondays, Wednesdays and Fridays. Recheck INR in 5 weeks. Call us with any medications Changes or concerns # [phone]

## 2020-12-18 NOTE — Progress Notes (Signed)
Phone: [phone]   Subjective:  Patient presents today for their annual physical. Chief complaint-noted.   See problem oriented charting- ROS- full  review of systems was completed and negative  except for: stable hearing loss, stable seasonal allergies with runny nose, stable tinnitus- long term hearing loss  The following were reviewed and entered/updated in epic: Past Medical History:  Diagnosis Date   Aortic stenosis    Aortic valve prosthesis present    Dissecting aortic aneurysm (any part), thoracic    Diverticulosis    Gastric ulcer    due to nsaids in past   Hypercholesterolemia    Mechanical heart valve present    Patient Active Problem List   Diagnosis Date Noted   Ascending aortic aneurysm (HCC) 10/18/2010    Priority: High   Hx of mechanical aortic valve replacement     Priority: High   Aortic atherosclerosis (HCC) 12/25/2020    Priority: Medium    History of adenomatous polyp of colon 12/11/2015    Priority: Medium    GERD (gastroesophageal reflux disease) 04/22/2011    Priority: Medium    Hypercholesterolemia     Priority: Medium    Acquired coagulation disorder (HCC) 12/23/2019    Priority: Low   Encounter for therapeutic drug monitoring 12/08/2015    Priority: Low   Long term (current) use of anticoagulants 05/08/2010    Priority: Low   Past Surgical History:  Procedure Laterality Date   CARDIAC VALVE REPLACEMENT     EEC     with propofol    ESOPHAGOGASTRODUODENOSCOPY  01/2010, 04/2010   ESOPHAGOGASTRODUODENOSCOPY     cauterize after nsaid related ulcer   EXPLORATION POST OPERATIVE OPEN HEART  1994, 1972    Family History  Problem Relation Age of Onset   Coronary artery disease Mother        CABG. 81 in 2019.    Coronary artery disease Father        aortic valve replacement   Skin cancer Father         44 in 2019.    Healthy Sister    Healthy Brother        overweight   Schizophrenia Sister        home health care   GI problems  Neg Hx     Medications- reviewed and updated Current Outpatient Medications  Medication Sig Dispense Refill   MULTIPLE VITAMIN PO Take 1 tablet by mouth daily.      pantoprazole (PROTONIX) 20 MG tablet Take 1 tablet (20 mg total) by mouth daily as needed for heartburn or indigestion. 30 tablet 1   rosuvastatin (CRESTOR) 20 MG tablet TAKE 1 TABLET(20 MG) BY MOUTH DAILY 90 tablet 3   warfarin (COUMADIN) 5 MG tablet TAKE 1/2 TO 1 TABLET BY MOUTH DAILY AS DIRECTED BY COUMADIN CLINIC 70 tablet 1   No current facility-administered medications for this visit.    Allergies-reviewed and updated Allergies  Allergen Reactions   Quinolones     Patient was warned about not using Cipro and similar antibiotics. Recent studies have raised concern that fluoroquinolone antibiotics could be associated with an increased risk of aortic aneurysm Fluoroquinolones have non-antimicrobial properties that might jeopardise the integrity of the extracellular matrix of the vascular wall In a  propensity score matched cohort study in [place], there was a 66% increased rate of aortic aneurysm or dissection associated with oral fluoroquinolone use, compared wit   Penicillins Rash    Reaction unknown may have been a  rash    Social History   Social History Narrative   Single. Lives with parents right now.       Undergrad at [employer], Masters oral roberts- divinity. Hard to get into ministry.    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 Oropharynx normal Neck: no thyromegaly CV: RRR stable murmur Lungs: CTAB no crackles, wheeze, rhonchi Abdomen: soft/nontender/nondistended/normal bowel  sounds. No rebound or guarding.  Ext: no edema Skin: warm, dry Neuro: grossly normal, moves all extremities, PERRLA    Assessment and Plan  65 y.o. male presenting for annual physical.  Health Maintenance counseling: 1. Anticipatory guidance: Patient counseled regarding regular dental exams -q6 months, eye exams -yearly ,  avoiding smoking and second hand smoke , limiting alcohol to 2 beverages per day -does not drink at all.  No illicit drugs.  2. Risk factor reduction:  Advised patient of need for regular exercise and diet rich and fruits and vegetables to reduce risk of heart attack and stroke.   Exercise-cardio about 3 times a week- mainly walking plus walks 3 miles a day at work.  Diet-weight stable over the last year-try to eat reasonably healthy Wt Readings from Last 3 Encounters:  12/25/20 158 lb 8 oz (71.9 kg)  03/09/20 159 lb (72.1 kg)  12/23/19 158 lb 12.8 oz (72 kg)  3. Immunizations/screenings/ancillary studies DISCUSSED:  -COVID booster vaccination #3- declines bivalent booster. Up to date otherwise  Immunization History  Administered Date(s) Administered   Influenza Inj Mdck Quad Pf 09/17/2018   Influenza,inj,Quad PF,6+ Mos 11/21/2014, 08/10/2015, 12/12/2016, 10/04/2017, 10/25/2020   Moderna Sars-Covid-2 Vaccination 05/12/2019, 06/09/2019   Tdap 11/21/2014   Zoster Recombinat (Shingrix) 12/15/2017, 03/18/2018  4. Prostate cancer screening-  PSA trend overall has been low risk.  We will update with labs today  Lab Results  Component Value Date   PSA 1.25 12/23/2019   PSA 1.27 12/17/2018   PSA 1.85 12/15/2017   5. Colon cancer screening -08/16/15 with 5 year repeat planned- history of adenomatous colon polyp with repeat due August 15, 2020 - we looked through chart and should actually be 10 years with hyperplastic polyp last and notes per GI- he would still like to check in around 60 he will message 77 after birthday 6. Skin cancer screening-[facility]s with [facility]  dermatology yearly for most part. advised regular sunscreen use. Denies worrisome, changing, or new skin lesions.  7. Smoking associated screening (lung cancer screening, AAA screen 65-75, UA)- Former smoker- 2.5 pack years and quit in 1970s.  We will plan on AAA screening at age 60-already has known ascending aortic aneurysm followed by cardiothoracic surgery as below .  Otherwise no regular screening- already gets CTA 8. STD screening -  opts out as not sexually active  Status of chronic or acute concerns   #hyperlipidemia/aortic atherosclerosis S: Medication:  Rosuvastatin 20mg  daily Lab Results  Component Value Date   CHOL 152 12/23/2019   HDL 55 12/23/2019   LDLCALC 81 12/23/2019   TRIG 81 12/23/2019   CHOLHDL 2.8 12/23/2019   A/P: LDL goal under 70-  update lipids and hope for improvement- we opted to leave dose alone as long as LDL under 100 at least.   #Cardiovascular follow-up-visit with cardiothoracic surgery Dr. Tyrone Sage on 03/09/2020.  No change in ascending aortic dilation 4.7 cm.  Mechanical aortic valve replacement in 1994-has appeared stable-remains on long-term Coumadin through cardiology clinic.  Plan is for repeat CTA scan in 1 year.  Has small lung nodule which are being followed as well.  Acquired coagulation disorder based on them on Coumadin is stable - will be establishing with Dr. Shari Prows cardiology - new cardiothoracic surgeon will be Dr. Laneta Simmers  #GERD- rare issues- PPI once a month  Recommended follow up: Return in about 1 year (around 12/25/2021) for physical or sooner if needed. . Future Appointments  Date Time Provider Department Center  12/26/2020  8:15 AM [facility]  03/29/2021  8:40 AM Shari Prows, Kathlynn Grate, MD [facility]   Lab/Order associations: fasting   ICD-10-CM   1. Preventative health care  Z00.00     2. Hyperlipidemia, unspecified hyperlipidemia type  E78.5     3. Acquired coagulation  disorder (HCC)  D68.9     4. Aortic atherosclerosis (HCC)  I70.0       No orders of the defined types were placed in this encounter.   I,Jada Bradford,acting as a scribe for Tana Conch, MD.,have documented all relevant documentation on the behalf of Tana Conch, MD,as directed by  Tana Conch, MD while in the presence of Tana Conch, MD.   I, Tana Conch, MD, have reviewed all documentation for this visit. The documentation on 12/25/20 for the exam, diagnosis, procedures, and orders are all accurate and complete.   Return precautions advised.  Tana Conch, MD

## 2020-12-25 ENCOUNTER — Encounter: Payer: Self-pay | Admitting: Family Medicine

## 2020-12-25 ENCOUNTER — Ambulatory Visit (INDEPENDENT_AMBULATORY_CARE_PROVIDER_SITE_OTHER): Payer: BC Managed Care – PPO | Admitting: Family Medicine

## 2020-12-25 VITALS — BP 122/70 | HR 77 | Temp 97.7°F | Ht 69.0 in | Wt 158.5 lb

## 2020-12-25 DIAGNOSIS — E785 Hyperlipidemia, unspecified: Secondary | ICD-10-CM | POA: Diagnosis not present

## 2020-12-25 DIAGNOSIS — I700 Atherosclerosis of aorta: Secondary | ICD-10-CM | POA: Diagnosis not present

## 2020-12-25 DIAGNOSIS — D689 Coagulation defect, unspecified: Secondary | ICD-10-CM

## 2020-12-25 DIAGNOSIS — Z125 Encounter for screening for malignant neoplasm of prostate: Secondary | ICD-10-CM

## 2020-12-25 DIAGNOSIS — Z0000 Encounter for general adult medical examination without abnormal findings: Secondary | ICD-10-CM

## 2020-12-25 LAB — LIPID PANEL
Cholesterol: 148 mg/dL (ref 0–200)
HDL: 50.7 mg/dL (ref >39.00)
LDL Cholesterol: 80 mg/dL (ref 0–99)
NonHDL: 97.62
Total CHOL/HDL Ratio: 3
Triglycerides: 88 mg/dL (ref 0.0–149.0)
VLDL: 17.6 mg/dL (ref 0.0–40.0)

## 2020-12-25 LAB — COMPREHENSIVE METABOLIC PANEL
ALT: 17 U/L (ref 0–53)
AST: 20 U/L (ref 0–37)
Albumin: 4.3 g/dL (ref 3.5–5.2)
Alkaline Phosphatase: 58 U/L (ref 39–117)
BUN: 11 mg/dL (ref 6–23)
CO2: 29 mEq/L (ref 19–32)
Calcium: 9.3 mg/dL (ref 8.4–10.5)
Chloride: 106 mEq/L (ref 96–112)
Creatinine, Ser: 0.9 mg/dL (ref 0.40–1.50)
GFR: 90.43 mL/min (ref >60.00)
Glucose, Bld: 84 mg/dL (ref 70–99)
Potassium: 4.4 mEq/L (ref 3.5–5.1)
Sodium: 142 mEq/L (ref 135–145)
Total Bilirubin: 0.8 mg/dL (ref 0.2–1.2)
Total Protein: 6.5 g/dL (ref 6.0–8.3)

## 2020-12-25 LAB — CBC WITH DIFFERENTIAL/PLATELET
Basophils Absolute: 0.1 10*3/uL (ref 0.0–0.1)
Basophils Relative: 1 % (ref 0.0–3.0)
Eosinophils Absolute: 0.2 10*3/uL (ref 0.0–0.7)
Eosinophils Relative: 3.4 % (ref 0.0–5.0)
HCT: 44 % (ref 39.0–52.0)
Hemoglobin: 15 g/dL (ref 13.0–17.0)
Lymphocytes Relative: 21.8 % (ref 12.0–46.0)
Lymphs Abs: 1.2 10*3/uL (ref 0.7–4.0)
MCHC: 34.1 g/dL (ref 30.0–36.0)
MCV: 88.7 fl (ref 78.0–100.0)
Monocytes Absolute: 0.4 10*3/uL (ref 0.1–1.0)
Monocytes Relative: 7.1 % (ref 3.0–12.0)
Neutro Abs: 3.6 10*3/uL (ref 1.4–7.7)
Neutrophils Relative %: 66.7 % (ref 43.0–77.0)
Platelets: 188 10*3/uL (ref 150.0–400.0)
RBC: 4.96 Mil/uL (ref 4.22–5.81)
RDW: 13 % (ref 11.5–15.5)
WBC: 5.5 10*3/uL (ref 4.0–10.5)

## 2020-12-25 LAB — PSA: PSA: 1.79 ng/mL (ref 0.10–4.00)

## 2020-12-25 NOTE — Patient Instructions (Addendum)
Please stop by lab before you go If you have mychart- we will send your results within 3 business days of [place] receiving them.  If you do not have mychart- we will call you about results within 5 business days of [place] receiving them.  *please also note that you will see labs on mychart as soon as they post. I will later go in and write notes on them- will say "notes from Dr. Durene Cal"  Recommended follow up: Return in about 1 year (around 12/25/2021) for physical or sooner if needed. .  Happy Holidays!

## 2020-12-26 ENCOUNTER — Other Ambulatory Visit: Payer: Self-pay

## 2020-12-26 ENCOUNTER — Ambulatory Visit (INDEPENDENT_AMBULATORY_CARE_PROVIDER_SITE_OTHER): Payer: BC Managed Care – PPO | Admitting: *Deleted

## 2020-12-26 DIAGNOSIS — Z5181 Encounter for therapeutic drug level monitoring: Secondary | ICD-10-CM | POA: Diagnosis not present

## 2020-12-26 DIAGNOSIS — Z952 Presence of prosthetic heart valve: Secondary | ICD-10-CM | POA: Diagnosis not present

## 2020-12-26 LAB — POCT INR: INR: 3.6 — AB (ref 2.0–3.0)

## 2020-12-26 NOTE — Patient Instructions (Signed)
Description   Hold today's dose then continue taking Warfarin 1/2 tablet daily except 1 tablet on Mondays, Wednesdays and Fridays. Recheck INR in 5 weeks. Call us with any medications Changes or concerns # [phone]

## 2021-01-08 ENCOUNTER — Other Ambulatory Visit: Payer: Self-pay | Admitting: Interventional Cardiology

## 2021-01-23 DIAGNOSIS — H25813 Combined forms of age-related cataract, bilateral: Secondary | ICD-10-CM | POA: Diagnosis not present

## 2021-01-30 ENCOUNTER — Ambulatory Visit (INDEPENDENT_AMBULATORY_CARE_PROVIDER_SITE_OTHER): Payer: BC Managed Care – PPO

## 2021-01-30 ENCOUNTER — Other Ambulatory Visit: Payer: Self-pay

## 2021-01-30 DIAGNOSIS — Z5181 Encounter for therapeutic drug level monitoring: Secondary | ICD-10-CM | POA: Diagnosis not present

## 2021-01-30 DIAGNOSIS — Z952 Presence of prosthetic heart valve: Secondary | ICD-10-CM

## 2021-01-30 LAB — POCT INR: INR: 4 — AB (ref 2.0–3.0)

## 2021-01-30 NOTE — Patient Instructions (Signed)
Description   Skip today's dosage of Warfarin, then start taking Warfarin 1/2 tablet daily except 1 tablet on Mondays and Fridays. Recheck INR in 3 weeks. Call us with any medications Changes or concerns # [phone]

## 2021-02-15 ENCOUNTER — Other Ambulatory Visit: Payer: Self-pay | Admitting: *Deleted

## 2021-02-15 DIAGNOSIS — R911 Solitary pulmonary nodule: Secondary | ICD-10-CM

## 2021-02-15 DIAGNOSIS — I7121 Aneurysm of the ascending aorta, without rupture: Secondary | ICD-10-CM

## 2021-02-20 ENCOUNTER — Other Ambulatory Visit: Payer: Self-pay

## 2021-02-20 ENCOUNTER — Ambulatory Visit (INDEPENDENT_AMBULATORY_CARE_PROVIDER_SITE_OTHER): Payer: BC Managed Care – PPO

## 2021-02-20 DIAGNOSIS — Z952 Presence of prosthetic heart valve: Secondary | ICD-10-CM

## 2021-02-20 DIAGNOSIS — Z5181 Encounter for therapeutic drug level monitoring: Secondary | ICD-10-CM

## 2021-02-20 LAB — POCT INR: INR: 3.6 — AB (ref 2.0–3.0)

## 2021-02-20 NOTE — Patient Instructions (Signed)
-   have a serving of greens today,  - continue taking Warfarin 1/2 tablet daily except 1 tablet on Mondays and Fridays. - Recheck INR in 4 weeks.  Call us with any medications Changes or concerns # [phone]

## 2021-03-19 ENCOUNTER — Encounter: Payer: Self-pay | Admitting: Cardiothoracic Surgery

## 2021-03-19 ENCOUNTER — Ambulatory Visit
Admission: RE | Admit: 2021-03-19 | Discharge: 2021-03-19 | Disposition: A | Discharge status: Home / Self Care | Payer: BC Managed Care – PPO | Source: Ambulatory Visit | Attending: Cardiothoracic Surgery | Admitting: Cardiothoracic Surgery

## 2021-03-19 ENCOUNTER — Other Ambulatory Visit: Payer: Self-pay

## 2021-03-19 ENCOUNTER — Ambulatory Visit: Payer: BC Managed Care – PPO | Admitting: Cardiothoracic Surgery

## 2021-03-19 VITALS — BP 144/78 | HR 79 | Resp 20 | Ht 69.0 in | Wt 161.4 lb

## 2021-03-19 DIAGNOSIS — I712 Thoracic aortic aneurysm, without rupture, unspecified: Secondary | ICD-10-CM | POA: Diagnosis not present

## 2021-03-19 DIAGNOSIS — M314 Aortic arch syndrome [Takayasu]: Secondary | ICD-10-CM | POA: Diagnosis not present

## 2021-03-19 DIAGNOSIS — R911 Solitary pulmonary nodule: Secondary | ICD-10-CM | POA: Diagnosis not present

## 2021-03-19 DIAGNOSIS — I7121 Aneurysm of the ascending aorta, without rupture: Secondary | ICD-10-CM

## 2021-03-19 MED ORDER — IOPAMIDOL (ISOVUE-370) INJECTION 76%
75.0000 mL | Freq: Once | INTRAVENOUS | Status: AC | PRN
  Administered 2021-03-19: 75 mL via INTRAVENOUS

## 2021-03-19 NOTE — Progress Notes (Signed)
HPI: ?The patient has been followed regularly by Dr. Servando Snare for an asymptomatic fusiform ascending thoracic aneurysm stable at 4.8 cm for the past few years.  The patient is status post aortic valve replacement in 1994 with a mechanical valve which continues to function well.  Last echo showed normal LV function and normal functioning valve. ? ?The patient denies any chest pain or shortness of breath.  He still works full-time at [employer]  He is very conscientious about taking his Coumadin, dental prophylaxis, and his other medications. ? ?I reviewed the images of his CTA performed today which show no change in the mild-moderate ascending fusiform aneurysm.  Of note the patient's native aortic valve was try leaflet. ? ?A smooth 6 mm nodule in the lingula of the left upper lobe remained stable as well. ? ?Current Outpatient Medications  ?Medication Sig Dispense Refill  ? MULTIPLE VITAMIN PO Take 1 tablet by mouth daily.     ? pantoprazole (PROTONIX) 20 MG tablet Take 1 tablet (20 mg total) by mouth daily as needed for heartburn or indigestion. 30 tablet 1  ? rosuvastatin (CRESTOR) 20 MG tablet TAKE 1 TABLET(20 MG) BY MOUTH DAILY 90 tablet 3  ? warfarin (COUMADIN) 5 MG tablet TAKE 1/2 TO 1 TABLET BY MOUTH DAILY AS DIRECTED BY COUMADIN CLINIC 70 tablet 1  ? ?No current facility-administered medications for this visit.  ? ? ? ?Physical Exam: ?  ?   Exam ? ?Blood pressure (!) 144/78, pulse 79, resp. rate 20, height 5\' 9"  (1.753 m), weight 161 lb 6.4 oz (73.2 kg), SpO2 97 %.  ? ?  General- alert and comfortable ?   Neck- no JVD, no cervical adenopathy palpable, no carotid bruit ?  Lungs- clear without rales, wheezes ?  Cor- regular rate and rhythm, no murmur , gallop ?  Abdomen- soft, non-tender ?  Extremities - warm, non-tender, minimal edema ?  Neuro- oriented, appropriate, no focal weakness ? ? ?Diagnostic Tests: ?CTA radiology reading is still pending but I have reviewed the images and have measured the ascending  aorta at a stable 4.8 cm diameter.  The left lingular nodule appears to be stable as well at 6 mm. ? ? ?Impression: ?Stable asymptomatic mild-moderate fusiform ascending aneurysm after aortic valve replacement 1994 by Dr. Servando Snare ? ?Plan: ?Continue annual surveillance scans.  Patient understands importance of blood pressure control to keep the systolic pressure less than 140 with regular blood pressure checks. ? ? ?Dahlia Byes, MD ?[facility] ?[phone] ? ? ? ? ? ?

## 2021-03-20 ENCOUNTER — Ambulatory Visit (INDEPENDENT_AMBULATORY_CARE_PROVIDER_SITE_OTHER): Payer: BC Managed Care – PPO

## 2021-03-20 DIAGNOSIS — Z5181 Encounter for therapeutic drug level monitoring: Secondary | ICD-10-CM | POA: Diagnosis not present

## 2021-03-20 DIAGNOSIS — Z952 Presence of prosthetic heart valve: Secondary | ICD-10-CM

## 2021-03-20 LAB — POCT INR: INR: 2.8 (ref 2.0–3.0)

## 2021-03-20 NOTE — Patient Instructions (Signed)
Description   Continue taking Warfarin 1/2 tablet daily except 1 tablet on Mondays and Fridays. Recheck INR in 5 weeks. Call us with any medications Changes or concerns # [phone]     

## 2021-03-29 ENCOUNTER — Ambulatory Visit: Payer: BC Managed Care – PPO | Admitting: Cardiology

## 2021-04-24 ENCOUNTER — Ambulatory Visit (INDEPENDENT_AMBULATORY_CARE_PROVIDER_SITE_OTHER): Payer: BC Managed Care – PPO | Admitting: *Deleted

## 2021-04-24 DIAGNOSIS — Z952 Presence of prosthetic heart valve: Secondary | ICD-10-CM | POA: Diagnosis not present

## 2021-04-24 DIAGNOSIS — Z5181 Encounter for therapeutic drug level monitoring: Secondary | ICD-10-CM

## 2021-04-24 LAB — POCT INR: INR: 2.3 (ref 2.0–3.0)

## 2021-05-29 ENCOUNTER — Ambulatory Visit (INDEPENDENT_AMBULATORY_CARE_PROVIDER_SITE_OTHER): Payer: BC Managed Care – PPO | Admitting: *Deleted

## 2021-05-29 DIAGNOSIS — Z5181 Encounter for therapeutic drug level monitoring: Secondary | ICD-10-CM | POA: Diagnosis not present

## 2021-05-29 DIAGNOSIS — Z952 Presence of prosthetic heart valve: Secondary | ICD-10-CM | POA: Diagnosis not present

## 2021-05-29 LAB — POCT INR: INR: 2.8 (ref 2.0–3.0)

## 2021-05-29 NOTE — Patient Instructions (Signed)
Description   Continue taking Warfarin 1/2 tablet daily except 1 tablet on Mondays and Fridays. Recheck INR in 5 weeks. Call us with any medications Changes or concerns # [phone]    IF YOU NEED TO HOLD YOUR WARFARIN FOR ANY SURGERY/ORAL SURGERY, ETC, HAVE THE PERSON DOING PROCEDURE TO SEND OVER A CLEARANCE FORM TO HOLD WARFARIN, ETC. FAX TO [phone] OR CALL [phone]

## 2021-06-07 ENCOUNTER — Encounter: Payer: Self-pay | Admitting: Cardiology

## 2021-06-08 ENCOUNTER — Encounter: Payer: Self-pay | Admitting: Pharmacist

## 2021-06-08 ENCOUNTER — Telehealth: Payer: Self-pay | Admitting: *Deleted

## 2021-06-08 NOTE — Telephone Encounter (Signed)
   Pre-operative Risk Assessment    Patient Name: Crosley Stejskal III  DOB: [birth year] MRN: [id number]    I WILL FAX OVER NOTES TO [facility] OFFICE TO PLEASE CLARIFY, CAN CALL OUR OFFICE [phone]; OK TO LEAVE A VM AS WELL ON THIS NUMBER   Request for Surgical Clearance    Procedure:   NEED CLARIFICATION AS TO WHAT ISOLATED PERIODONTAL SURGICAL PROCEDURE WILL ENTAIL; EXAMPLE IF ANY TEETH EXTRACTED; WE WILL NEED TO KNOW HOW MANY TEETH BEING EXTRACTED  Date of Surgery:  Clearance TBD                                 Surgeon:  DR. Filbert Berthold, [facility] Surgeon's Group or Practice Name:  DR. Filbert Berthold, [facility] Phone number:  [phone] Fax number:  [phone]   Type of Clearance Requested:   - Medical  - Pharmacy:  Hold Warfarin (Coumadin) ; NOTES STATES PLEASE MODIFY WARFARIN TO KEEP INR < 2.5   Type of Anesthesia:  Not Indicated; NEED TO KNOW WHAT TYPE OF ANESTHESIA BEING USED; IF ANY   Additional requests/questions:    Elpidio Anis   06/08/2021, 2:47 PM

## 2021-06-08 NOTE — Telephone Encounter (Signed)
This encounter was created in error - please disregard.

## 2021-06-11 NOTE — Telephone Encounter (Signed)
I tried to reach [facility] office today, but no answer. See previous notes, need clarification.

## 2021-06-12 NOTE — Telephone Encounter (Addendum)
Spoke with Dr Lacretia Leigh office to seek clarification on the fax they sent to the office.   The name of the procedure is Isol mucoperisteal flap elevation on lower left posterior. Meaning the dentist is cutting down the gums to lengthen the crowns.   Anesthesia being used is Local.

## 2021-06-13 NOTE — Telephone Encounter (Signed)
   Patient Name: Ryan Lamb  DOB: [birth year] MRN: [id number]  Primary Cardiologist: Tobias Alexander, MD  Chart reviewed as part of pre-operative protocol coverage.   Given past medical history and time since last visit, based on ACC/AHA guidelines, Ryan Lamb would be at acceptable risk for the planned procedure without further cardiovascular testing.   Per my chart message from clinical pharmacist dated 06/07/2021, patient may hold Coumadin for 1 day prior to dental work. Patient was advised to resume Coumadin the same evening after dental work.  The patient was advised that if he develops new symptoms prior to surgery to contact our office to arrange for a follow-up visit, and he verbalized understanding.  SBE prophylaxis is required for the patient from a cardiac standpoint.  I will route this recommendation to the requesting party via Epic fax function and remove from pre-op pool.  Please call with questions.  Joylene Grapes, NP 06/13/2021, 12:47 PM

## 2021-07-11 ENCOUNTER — Ambulatory Visit (INDEPENDENT_AMBULATORY_CARE_PROVIDER_SITE_OTHER): Payer: BC Managed Care – PPO | Admitting: *Deleted

## 2021-07-11 DIAGNOSIS — Z952 Presence of prosthetic heart valve: Secondary | ICD-10-CM | POA: Diagnosis not present

## 2021-07-11 DIAGNOSIS — Z5181 Encounter for therapeutic drug level monitoring: Secondary | ICD-10-CM

## 2021-07-11 LAB — POCT INR: INR: 2.4 (ref 2.0–3.0)

## 2021-07-11 NOTE — Patient Instructions (Signed)
Description   Today take 1 tablet then continue taking Warfarin 1/2 tablet daily except 1 tablet on Mondays and Fridays. Recheck INR in 6 weeks. Call us with any medications Changes or concerns # [phone]

## 2021-08-22 ENCOUNTER — Ambulatory Visit (INDEPENDENT_AMBULATORY_CARE_PROVIDER_SITE_OTHER): Payer: BC Managed Care – PPO | Admitting: *Deleted

## 2021-08-22 DIAGNOSIS — Z5181 Encounter for therapeutic drug level monitoring: Secondary | ICD-10-CM | POA: Diagnosis not present

## 2021-08-22 DIAGNOSIS — Z952 Presence of prosthetic heart valve: Secondary | ICD-10-CM | POA: Diagnosis not present

## 2021-08-22 LAB — POCT INR: INR: 3.2 — AB (ref 2.0–3.0)

## 2021-08-22 NOTE — Patient Instructions (Signed)
Description   Continue taking Warfarin 1/2 tablet daily except 1 tablet on Mondays and Fridays. Recheck INR in 6 weeks. Call us with any medications Changes or concerns # [phone]

## 2021-09-19 NOTE — Progress Notes (Deleted)
Cardiology Office Note:    Date:  09/19/2021   ID:  Ryan Lamb, DOB 1955-07-13, MRN [id number]  PCP:  Shelva Majestic, MD   [facility] Providers Cardiologist:  Tobias Alexander, MD   Referring MD: Shelva Majestic, MD   History of Present Illness:    Ryan Lamb is a 66 y.o. 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  Last myoview 2017 with normal EF, moderate inferolateral reversible defect but with normal wall motion which was borderline and recommended for continued medical management unless symptoms worsened. TTE 08/2015 showed LVEF 55-60%, G2DD, normal functioning mechanical valve prosthesis with mean gradient , mildly dilated aortic root 40mm, no other significant valve disease. Has also followed with CT surgery for ascending aortic aneurysm which measured 4.8cm on CTA in 03/2021.  Was last seen in clinic by Dr. Delton See in 2021 where he was doing well.  Today. ***  Past Medical History:  Diagnosis Date   Aortic stenosis    Aortic valve prosthesis present    Dissecting aortic aneurysm (any part), thoracic    Diverticulosis    Gastric ulcer    due to nsaids in past   Hypercholesterolemia    Mechanical heart valve present     Past Surgical History:  Procedure Laterality Date   CARDIAC VALVE REPLACEMENT     EEC     with propofol    ESOPHAGOGASTRODUODENOSCOPY  01/2010, 04/2010   ESOPHAGOGASTRODUODENOSCOPY     cauterize after nsaid related ulcer   EXPLORATION POST OPERATIVE OPEN HEART  1994, 1972    Current Medications: No outpatient medications have been marked as taking for the 09/21/21 encounter (Appointment) with Meriam Sprague, MD.     Allergies:   Quinolones and Penicillins   Social History   Socioeconomic History   Marital status: Single    Spouse  name: Not on file   Number of children: Not on file   Years of education: Not on file   Highest education level: Not on file  Occupational History   Not on file  Tobacco Use   Smoking status: Former    Packs/day: 0.50    Years: 5.00    Total pack years: 2.50    Types: Cigarettes    Quit date: 10/17/1977    Years since quitting: 43.9   Smokeless tobacco: Never  Substance and Sexual Activity   Alcohol use: No   Drug use: No   Sexual activity: Not on file  Other Topics Concern   Not on file  Social History Narrative   Single. Lives with parents right now.       Undergrad at [employer], Masters oral roberts- divinity. Hard to get into ministry.    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 There  is no history of GI problems.  ROS:   Please see the history of present illness.    *** All other systems reviewed and are negative.  EKGs/Labs/Other Studies Reviewed:    The following studies were reviewed today: CTA Aorta 03/2021: IMPRESSION: 1. 4.8 cm ascending thoracic aortic aneurysm, previously 4.7. Recommend semi-annual imaging followup by CTA or MRA and referral to cardiothoracic surgery if not already obtained. This recommendation follows 2010 ACCF/AHA/AATS/ACR/ASA/SCA/SCAI/SIR/STS/SVM Guidelines for the Diagnosis and Management of Patients With Thoracic Aortic Disease. Circulation. 2010; 121: J628-B151. Aortic aneurysm NOS (ICD10-I71.9) 2. 7 mm left  upper lobe nodule, previously 6 mm, present and enlarging since 2011 consistent with indolent process.   TTE 2017: Study Conclusions   - Left ventricle: The cavity size was normal. Wall thickness was    normal. Systolic function was normal. The estimated ejection    fraction was in the range of 55% to 60%. Features are consistent    with a pseudonormal left ventricular filling pattern, with    concomitant abnormal relaxation and increased filling pressure    (grade 2 diastolic dysfunction).  - Aortic valve: Mechanical aortic valve. No significant prosthetic    valve stenosis. There was no significant regurgitation. Mean    gradient (S): 12 mm Hg.  - Aorta: Mildly dilated aortic root. Aortic root dimension: 37 mm    (ED).  - Right ventricle: The cavity size was normal. Systolic function    was normal.  - Tricuspid valve: Peak RV-RA gradient (S): 20 mm Hg.  - Pulmonary arteries: PA peak pressure: 23 mm Hg (S).  - Inferior vena cava: The vessel was normal in size. The    respirophasic diameter changes were in the normal range (= 50%),    consistent with normal central venous pressure. Myoview 2017: The left ventricular ejection fraction is normal (55-65%). Nuclear stress EF: 63%. No T wave inversion was noted during stress. There was no ST segment deviation noted during stress. Defect 1: There is a medium defect of moderate severity.   Moderate size and intensity, reversible inferolateral perfusion defect. There is underlying bowel attenuation, which could cause a degree of artifact, however, the SDS is 4 and significant ischemia cannot be excluded. LVEF 63% with normal wall motion. Clinical correlation is advised.  EKG:  EKG is *** ordered today.  The ekg ordered today demonstrates ***  Recent Labs: 12/25/2020: ALT 17; BUN 11; Creatinine, Ser 0.90; Hemoglobin 15.0; Platelets 188.0; Potassium 4.4; Sodium 142  Recent Lipid Panel    Component Value Date/Time   CHOL 148 12/25/2020  0924   TRIG 88.0 12/25/2020 0924   HDL 50.70 12/25/2020 0924   CHOLHDL 3 12/25/2020 0924   VLDL 17.6 12/25/2020 0924   LDLCALC 80 12/25/2020 0924   LDLCALC 81 12/23/2019 0919     Risk Assessment/Calculations:   {Does this patient have ATRIAL FIBRILLATION?:[id number]}  No BP recorded.  {Refresh Note OR Click here to enter BP  :1}***         Physical Exam:    VS:  There were no vitals taken for this visit.    Wt Readings from Last 3 Encounters:  03/19/21 161 lb 6.4 oz (73.2 kg)  12/25/20 158 lb 8 oz (71.9 kg)  03/09/20 159 lb (72.1 kg)     GEN: *** Well nourished, well developed in no acute distress HEENT: Normal NECK: No JVD; No carotid bruits LYMPHATICS: No lymphadenopathy CARDIAC: ***RRR, no murmurs, rubs, gallops RESPIRATORY:  Clear to auscultation without rales, wheezing or  rhonchi  ABDOMEN: Soft, non-tender, non-distended MUSCULOSKELETAL:  No edema; No deformity  SKIN: Warm and dry NEUROLOGIC:  Alert and oriented x 3 PSYCHIATRIC:  Normal affect   ASSESSMENT:    No diagnosis found. PLAN:    In order of problems listed above:  #History of Severe AS s/p mechanical AVR in 1994: Patient with trileaflet aortic valve noted on surgery with prior valvuloplasty in 1971 and 62mm St Jude mechanical AVR in 1994. Last TTE in 2017 with normal valve function with mean gradient . Currently *** -Check TTE for monitoring -Continue warfarin for [facility] -SBE ppx  #Ascending aortic dilation: Measures 4.8cm on CTA in 03/2021. Monitored by CT surgery. BP well controlled. -Follow-up with CT surgery as scheduled -Monitored with yearly CTAs -BP controlled  #HLD: -Continue crestor 20mg  daily      {Are you ordering a CV Procedure (e.g. stress test, cath, DCCV, TEE, etc)?   Press F2        :    Medication Adjustments/Labs and Tests Ordered: Current medicines are reviewed at length with the patient today.  Concerns regarding medicines are outlined above.  No orders  of the defined types were placed in this encounter.  No orders of the defined types were placed in this encounter.   There are no Patient Instructions on file for this visit.   Signed, [id number]}, MD  09/19/2021 3:07 PM    [facility]

## 2021-09-20 NOTE — Progress Notes (Signed)
Cardiology Office Note:    Date:  09/21/2021   ID:  Ryan Lamb, DOB 1955/07/28, MRN [id number]  PCP:  Shelva Majestic, MD   [facility] Providers Cardiologist:  Tobias Alexander, MD   Referring MD: Shelva Majestic, MD   History of Present Illness:    Ryan Lamb is a 66 y.o. 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  Last myoview 2017 with normal EF, moderate inferolateral reversible defect but with normal wall motion which was borderline and recommended for continued medical management unless symptoms worsened. TTE 08/2015 showed LVEF 55-60%, G2DD, normal functioning mechanical valve prosthesis with mean gradient , mildly dilated aortic root 31mm, no other significant valve disease. Has also followed with CT surgery for ascending aortic aneurysm which measured 4.8cm on CTA in 03/2021.  Was last seen in clinic by Dr. Delton See in 2021 where he was doing well.  Today, the patient states that he has done well since receiving the mechanical valve in 1994. 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 He remains compliant with warfarin; he has INR checks with a goal of 2.5 to 3.05  On occasion he checks his BP at home, which is usually well controlled but will fluctuate at times. He notes it will increase after eating too much salt. He denies any significant LE edema.  His aortic aneurysm is now followed by Dr. Maren Beach. This measured 4.8cm on CTA in 03/2021.  He denies any palpitations, chest pain, shortness of breath, or peripheral edema. No lightheadedness, headaches, syncope, orthopnea, or PND.   Past Medical History:  Diagnosis Date   Aortic stenosis    Aortic valve prosthesis present    Dissecting aortic  aneurysm (any part), thoracic (HCC)    Diverticulosis    Gastric ulcer    due to nsaids in past   Hypercholesterolemia    Mechanical heart valve present     Past Surgical History:  Procedure Laterality Date   CARDIAC VALVE REPLACEMENT     EEC     with propofol    ESOPHAGOGASTRODUODENOSCOPY  01/2010, 04/2010   ESOPHAGOGASTRODUODENOSCOPY     cauterize after nsaid related ulcer   EXPLORATION POST OPERATIVE OPEN HEART  1994, 1972    Current Medications: Current Meds  Medication Sig   MULTIPLE VITAMIN PO Take 1 tablet by mouth daily.    pantoprazole (PROTONIX) 20 MG tablet Take 1 tablet (20 mg total) by mouth daily as needed for heartburn or indigestion.   warfarin (COUMADIN) 5 MG tablet TAKE 1/2 TO 1 TABLET BY MOUTH DAILY AS DIRECTED BY COUMADIN CLINIC   [DISCONTINUED] rosuvastatin (CRESTOR) 20 MG tablet TAKE 1 TABLET(20 MG) BY MOUTH DAILY     Allergies:   Quinolones and Penicillins   Social History   Socioeconomic History   Marital status: Single    Spouse name: Not on file   Number of children: Not on file   Years of education: Not on file   Highest education level: Not on file  Occupational History   Not on file  Tobacco Use   Smoking status: Former    Packs/day: 0.50    Years: 5.00    Total pack years: 2.50    Types: Cigarettes    Quit date: 10/17/1977  Years since quitting: 43.9   Smokeless tobacco: Never  Substance and Sexual Activity   Alcohol use: No   Drug use: No   Sexual activity: Not on file  Other Topics Concern   Not on file  Social History Narrative   Single. Lives with parents right now.       Undergrad at [employer], Masters oral roberts- divinity. Hard to get into ministry.    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 There is no history of GI problems.  ROS:   Review of Systems  Constitutional:  Negative for chills and fever.  HENT:  Negative for nosebleeds and tinnitus.   Eyes:  Negative for blurred vision and pain.  Respiratory:  Negative for cough, hemoptysis, shortness of breath and stridor.   Cardiovascular:  Negative for chest pain, palpitations, orthopnea, claudication, leg swelling and PND.  Gastrointestinal:  Negative for blood in stool, diarrhea, nausea and vomiting.  Genitourinary:  Negative for dysuria and hematuria.  Musculoskeletal:  Negative for falls.  Neurological:  Negative for dizziness, loss of consciousness and headaches.  Psychiatric/Behavioral:  Negative for depression, hallucinations and substance abuse. The patient does not have insomnia.      EKGs/Labs/Other Studies Reviewed:    The following studies were reviewed today:  CTA Aorta 03/2021: IMPRESSION: 1. 4.8 cm ascending thoracic aortic aneurysm, previously 4.7. Recommend semi-annual imaging followup by CTA or MRA and referral to cardiothoracic surgery if not already obtained. This recommendation follows 2010 ACCF/AHA/AATS/ACR/ASA/SCA/SCAI/SIR/STS/SVM Guidelines for the Diagnosis and Management of Patients With Thoracic Aortic Disease. Circulation. 2010; 121: S341-D622. Aortic aneurysm NOS (ICD10-I71.9) 2. 7 mm left upper lobe nodule, previously 6 mm, present and enlarging since 2011 consistent with indolent process.   TTE 2017: Study Conclusions   - Left ventricle: The cavity size was normal. Wall thickness was    normal. Systolic function was normal. The estimated ejection    fraction was in the range of 55% to  60%. Features are consistent    with a pseudonormal left ventricular filling pattern, with    concomitant abnormal relaxation and increased filling pressure    (grade 2 diastolic dysfunction).  - Aortic valve: Mechanical aortic valve. No significant prosthetic    valve stenosis. There was no significant regurgitation. Mean    gradient (S): 12 mm Hg.  - Aorta: Mildly dilated aortic root. Aortic root dimension: 37 mm    (ED).  - Right ventricle: The cavity size was normal. Systolic function    was normal.  - Tricuspid valve: Peak RV-RA gradient (S): 20 mm Hg.  - Pulmonary arteries: PA peak pressure: 23 mm Hg (S).  - Inferior vena cava: The vessel was normal in size. The    respirophasic diameter changes were in the normal range (= 50%),    consistent with normal central venous pressure.  Myoview 2017: The left ventricular ejection fraction is normal (55-65%). Nuclear stress EF: 63%. No T wave inversion was  noted during stress. There was no ST segment deviation noted during stress. Defect 1: There is a medium defect of moderate severity.   Moderate size and intensity, reversible inferolateral perfusion defect. There is underlying bowel attenuation, which could cause a degree of artifact, however, the SDS is 4 and significant ischemia cannot be excluded. LVEF 63% with normal wall motion. Clinical correlation is advised.  EKG:  EKG is personally reviewed. 09/21/2021:  Sinus rhythm. Rate 76 bpm.   Recent Labs: 12/25/2020: ALT 17; BUN 11; Creatinine, Ser 0.90; Hemoglobin 15.0; Platelets 188.0; Potassium 4.4; Sodium 142   Recent Lipid Panel    Component Value Date/Time   CHOL 148 12/25/2020 0924   TRIG 88.0 12/25/2020 0924   HDL 50.70 12/25/2020 0924   CHOLHDL 3 12/25/2020 0924   VLDL 17.6 12/25/2020 0924   LDLCALC 80 12/25/2020 0924   LDLCALC 81 12/23/2019 0919     Risk Assessment/Calculations:                Physical Exam:    VS:  BP 120/80   Pulse 76   Ht 5\' 9"   (1.753 m)   Wt 164 lb 3.2 oz (74.5 kg)   SpO2 98%   BMI 24.25 kg/m     Wt Readings from Last 3 Encounters:  09/21/21 164 lb 3.2 oz (74.5 kg)  03/19/21 161 lb 6.4 oz (73.2 kg)  12/25/20 158 lb 8 oz (71.9 kg)     GEN: Well nourished, well developed in no acute distress HEENT: Normal NECK: No JVD; No carotid bruits CARDIAC: RRR, + mechanical click, 2/6 systolic murmur, No rubs, No gallops RESPIRATORY:  Clear to auscultation without rales, wheezing or rhonchi  ABDOMEN: Soft, non-tender, non-distended MUSCULOSKELETAL:  No edema; No deformity  SKIN: Warm and dry NEUROLOGIC:  Alert and oriented x 3 PSYCHIATRIC:  Normal affect   ASSESSMENT:    1. Hx of mechanical aortic valve replacement   2. Long term current use of anticoagulant therapy   3. Hypercholesterolemia   4. Aneurysm of ascending aorta without rupture (HCC)    PLAN:    In order of problems listed above:  #History of Severe AS s/p mechanical AVR in 1994: Patient with trileaflet aortic valve noted on surgery with prior valvuloplasty in 1971 and 71mm St Jude mechanical AVR in 1994. Last TTE in 2017 with normal valve function with mean gradient 2018. Currently doing well. Will check TTE for monitoring. -Check TTE for monitoring -Continue warfarin for [facility] -SBE ppx  #Ascending aortic dilation: Measures 4.8cm on CTA in 03/2021. Monitored by CT surgery. BP well controlled. -Follow-up with CT surgery as scheduled -Monitored with yearly CTAs -BP controlled  #HLD: -Continue crestor 20mg  daily -Lipids monitored by PCP         Follow-up:  1 year.  Medication Adjustments/Labs and Tests Ordered: Current medicines are reviewed at length with the patient today.  Concerns regarding medicines are outlined above.   Orders Placed This Encounter  Procedures   EKG 12-Lead   ECHOCARDIOGRAM COMPLETE   Meds ordered this encounter  Medications   rosuvastatin (CRESTOR) 20 MG tablet    Sig: TAKE 1 TABLET(20 MG) BY MOUTH DAILY     Dispense:  90 tablet    Refill:  4   Patient Instructions  Medication Instructions:   Your physician recommends that you continue on your current medications as directed. Please refer to the Current Medication list given to you today.  *If you need a refill on your cardiac medications before  your next appointment, please call your pharmacy*   Testing/Procedures:  Your physician has requested that you have an echocardiogram. Echocardiography is a painless test that uses sound waves to create images of your heart. It provides your doctor with information about the size and shape of your heart and how well your heart's chambers and valves are working. This procedure takes approximately one hour. There are no restrictions for this procedure.   Follow-Up: At [facility], you and your health needs are our priority.  As part of our continuing mission to provide you with exceptional heart care, we have created designated Provider Care Teams.  These Care Teams include your primary Cardiologist (physician) and Advanced Practice Providers (APPs -  Physician Assistants and Nurse Practitioners) who all work together to provide you with the care you need, when you need it.  We recommend signing up for the patient portal called "MyChart".  Sign up information is provided on this After Visit Summary.  MyChart is used to connect with patients for Virtual Visits (Telemedicine).  Patients are able to view lab/test results, encounter notes, upcoming appointments, etc.  Non-urgent messages can be sent to your provider as well.   To learn more about what you can do with MyChart, go to [email or web address].    Your next appointment:   1 year(s)  The format for your next appointment:   In Person  Provider:   DR. Shari Prows    Important Information About Sugar         I,Mathew Stumpf,acting as a scribe for Meriam Sprague, MD.,have documented all relevant documentation on the  behalf of Meriam Sprague, MD,as directed by  Meriam Sprague, MD while in the presence of Meriam Sprague, MD.  I, Meriam Sprague, MD, have reviewed all documentation for this visit. The documentation on 09/21/21 for the exam, diagnosis, procedures, and orders are all accurate and complete.   Signed, Meriam Sprague, MD  09/21/2021 8:58 AM    [facility]

## 2021-09-21 ENCOUNTER — Other Ambulatory Visit: Payer: Self-pay

## 2021-09-21 ENCOUNTER — Encounter: Payer: Self-pay | Admitting: Cardiology

## 2021-09-21 ENCOUNTER — Ambulatory Visit: Payer: Medicare Other | Attending: Cardiology | Admitting: Cardiology

## 2021-09-21 VITALS — BP 120/80 | HR 76 | Ht 69.0 in | Wt 164.2 lb

## 2021-09-21 DIAGNOSIS — Z7901 Long term (current) use of anticoagulants: Secondary | ICD-10-CM | POA: Diagnosis not present

## 2021-09-21 DIAGNOSIS — I7121 Aneurysm of the ascending aorta, without rupture: Secondary | ICD-10-CM

## 2021-09-21 DIAGNOSIS — Z952 Presence of prosthetic heart valve: Secondary | ICD-10-CM

## 2021-09-21 DIAGNOSIS — E7800 Pure hypercholesterolemia, unspecified: Secondary | ICD-10-CM | POA: Diagnosis not present

## 2021-09-21 MED ORDER — ROSUVASTATIN CALCIUM 20 MG PO TABS: ORAL_TABLET | ORAL | 4 refills | Status: DC

## 2021-09-21 MED ORDER — WARFARIN SODIUM 5 MG PO TABS: ORAL_TABLET | ORAL | 1 refills | Status: DC

## 2021-09-21 NOTE — Telephone Encounter (Signed)
Prescription refill request received for warfarin Lov: 09/21/21 Shari Prows) Next INR check: 10/03/21 Warfarin tablet strength: 5mg   Appropriate dose and refill sent to requested pharmacy.

## 2021-09-21 NOTE — Patient Instructions (Signed)
Medication Instructions:   Your physician recommends that you continue on your current medications as directed. Please refer to the Current Medication list given to you today.  *If you need a refill on your cardiac medications before your next appointment, please call your pharmacy*   Testing/Procedures:  Your physician has requested that you have an echocardiogram. Echocardiography is a painless test that uses sound waves to create images of your heart. It provides your doctor with information about the size and shape of your heart and how well your heart's chambers and valves are working. This procedure takes approximately one hour. There are no restrictions for this procedure.   Follow-Up: At [facility], you and your health needs are our priority.  As part of our continuing mission to provide you with exceptional heart care, we have created designated Provider Care Teams.  These Care Teams include your primary Cardiologist (physician) and Advanced Practice Providers (APPs -  Physician Assistants and Nurse Practitioners) who all work together to provide you with the care you need, when you need it.  We recommend signing up for the patient portal called "MyChart".  Sign up information is provided on this After Visit Summary.  MyChart is used to connect with patients for Virtual Visits (Telemedicine).  Patients are able to view lab/test results, encounter notes, upcoming appointments, etc.  Non-urgent messages can be sent to your provider as well.   To learn more about what you can do with MyChart, go to [email or web address].    Your next appointment:   1 year(s)  The format for your next appointment:   In Person  Provider:   DR. Shari Prows    Important Information About Sugar

## 2021-09-22 ENCOUNTER — Other Ambulatory Visit: Payer: Self-pay | Admitting: Family Medicine

## 2021-09-22 DIAGNOSIS — Z952 Presence of prosthetic heart valve: Secondary | ICD-10-CM

## 2021-10-01 ENCOUNTER — Encounter: Payer: Self-pay | Admitting: *Deleted

## 2021-10-03 ENCOUNTER — Ambulatory Visit: Payer: Medicare Other | Attending: Cardiology | Admitting: *Deleted

## 2021-10-03 DIAGNOSIS — Z952 Presence of prosthetic heart valve: Secondary | ICD-10-CM | POA: Diagnosis not present

## 2021-10-03 DIAGNOSIS — Z5181 Encounter for therapeutic drug level monitoring: Secondary | ICD-10-CM | POA: Diagnosis not present

## 2021-10-03 LAB — POCT INR: INR: 3.9 — AB (ref 2.0–3.0)

## 2021-10-03 NOTE — Patient Instructions (Signed)
Description   Do not take any Warfarin then continue taking Warfarin 1/2 tablet daily except 1 tablet on Mondays and Fridays. Recheck INR in 4 weeks. Call us with any medications Changes or concerns # [phone]

## 2021-10-16 ENCOUNTER — Ambulatory Visit (HOSPITAL_COMMUNITY): Payer: Medicare Other | Attending: Cardiology

## 2021-10-16 DIAGNOSIS — Z952 Presence of prosthetic heart valve: Secondary | ICD-10-CM | POA: Insufficient documentation

## 2021-10-16 LAB — ECHOCARDIOGRAM COMPLETE
AV Mean grad: 11.5 mmHg
AV Peak grad: 21.9 mmHg
Ao pk vel: 2.34 m/s
Area-P 1/2: 3.37 cm2
S' Lateral: 2.3 cm

## 2021-10-16 MED ORDER — PERFLUTREN LIPID MICROSPHERE
1.0000 mL | INTRAVENOUS | Status: AC | PRN
  Administered 2021-10-16: 2 mL via INTRAVENOUS

## 2021-10-31 ENCOUNTER — Ambulatory Visit: Payer: Medicare Other | Attending: Cardiology | Admitting: *Deleted

## 2021-10-31 DIAGNOSIS — Z5181 Encounter for therapeutic drug level monitoring: Secondary | ICD-10-CM | POA: Diagnosis not present

## 2021-10-31 DIAGNOSIS — Z952 Presence of prosthetic heart valve: Secondary | ICD-10-CM

## 2021-10-31 LAB — POCT INR: INR: 2.6 (ref 2.0–3.0)

## 2021-10-31 NOTE — Patient Instructions (Signed)
Description   Continue taking Warfarin 1/2 tablet daily except 1 tablet on Mondays and Fridays. Recheck INR in 5 weeks. Call us with any medications Changes or concerns # [phone]

## 2021-12-10 ENCOUNTER — Ambulatory Visit: Payer: Medicare Other | Attending: Cardiology

## 2021-12-10 DIAGNOSIS — Z952 Presence of prosthetic heart valve: Secondary | ICD-10-CM | POA: Diagnosis not present

## 2021-12-10 DIAGNOSIS — Z5181 Encounter for therapeutic drug level monitoring: Secondary | ICD-10-CM

## 2021-12-10 LAB — POCT INR: INR: 3.2 — AB (ref 2.0–3.0)

## 2021-12-10 NOTE — Patient Instructions (Signed)
Continue taking Warfarin 1/2 tablet daily except 1 tablet on Mondays and Fridays. Recheck INR in 6 weeks. Call us with any medications Changes or concerns # [phone]

## 2021-12-20 ENCOUNTER — Encounter: Payer: Self-pay | Admitting: *Deleted

## 2021-12-26 ENCOUNTER — Ambulatory Visit (INDEPENDENT_AMBULATORY_CARE_PROVIDER_SITE_OTHER): Payer: Medicare Other | Admitting: Family Medicine

## 2021-12-26 ENCOUNTER — Encounter: Payer: Self-pay | Admitting: Family Medicine

## 2021-12-26 VITALS — BP 128/80 | HR 66 | Temp 97.6°F | Ht 69.0 in | Wt 165.4 lb

## 2021-12-26 DIAGNOSIS — Z0000 Encounter for general adult medical examination without abnormal findings: Secondary | ICD-10-CM | POA: Diagnosis not present

## 2021-12-26 DIAGNOSIS — I7121 Aneurysm of the ascending aorta, without rupture: Secondary | ICD-10-CM

## 2021-12-26 DIAGNOSIS — Z23 Encounter for immunization: Secondary | ICD-10-CM

## 2021-12-26 DIAGNOSIS — Z87891 Personal history of nicotine dependence: Secondary | ICD-10-CM

## 2021-12-26 DIAGNOSIS — E7800 Pure hypercholesterolemia, unspecified: Secondary | ICD-10-CM | POA: Diagnosis not present

## 2021-12-26 DIAGNOSIS — D689 Coagulation defect, unspecified: Secondary | ICD-10-CM

## 2021-12-26 DIAGNOSIS — R6882 Decreased libido: Secondary | ICD-10-CM | POA: Diagnosis not present

## 2021-12-26 DIAGNOSIS — Z79899 Other long term (current) drug therapy: Secondary | ICD-10-CM

## 2021-12-26 DIAGNOSIS — R351 Nocturia: Secondary | ICD-10-CM | POA: Diagnosis not present

## 2021-12-26 DIAGNOSIS — Z952 Presence of prosthetic heart valve: Secondary | ICD-10-CM

## 2021-12-26 LAB — LIPID PANEL
Cholesterol: 165 mg/dL (ref 0–200)
HDL: 51.8 mg/dL (ref >39.00)
LDL Cholesterol: 87 mg/dL (ref 0–99)
NonHDL: 112.77
Total CHOL/HDL Ratio: 3
Triglycerides: 130 mg/dL (ref 0.0–149.0)
VLDL: 26 mg/dL (ref 0.0–40.0)

## 2021-12-26 LAB — COMPREHENSIVE METABOLIC PANEL
ALT: 27 U/L (ref 0–53)
AST: 26 U/L (ref 0–37)
Albumin: 4.4 g/dL (ref 3.5–5.2)
Alkaline Phosphatase: 62 U/L (ref 39–117)
BUN: 13 mg/dL (ref 6–23)
CO2: 28 mEq/L (ref 19–32)
Calcium: 9.4 mg/dL (ref 8.4–10.5)
Chloride: 107 mEq/L (ref 96–112)
Creatinine, Ser: 0.94 mg/dL (ref 0.40–1.50)
GFR: 85.23 mL/min (ref >60.00)
Glucose, Bld: 86 mg/dL (ref 70–99)
Potassium: 3.6 mEq/L (ref 3.5–5.1)
Sodium: 144 mEq/L (ref 135–145)
Total Bilirubin: 0.8 mg/dL (ref 0.2–1.2)
Total Protein: 6.7 g/dL (ref 6.0–8.3)

## 2021-12-26 LAB — CBC WITH DIFFERENTIAL/PLATELET
Basophils Absolute: 0.1 10*3/uL (ref 0.0–0.1)
Basophils Relative: 0.9 % (ref 0.0–3.0)
Eosinophils Absolute: 0.3 10*3/uL (ref 0.0–0.7)
Eosinophils Relative: 4.2 % (ref 0.0–5.0)
HCT: 44.2 % (ref 39.0–52.0)
Hemoglobin: 15.4 g/dL (ref 13.0–17.0)
Lymphocytes Relative: 23.6 % (ref 12.0–46.0)
Lymphs Abs: 1.4 10*3/uL (ref 0.7–4.0)
MCHC: 34.9 g/dL (ref 30.0–36.0)
MCV: 89.1 fl (ref 78.0–100.0)
Monocytes Absolute: 0.5 10*3/uL (ref 0.1–1.0)
Monocytes Relative: 7.5 % (ref 3.0–12.0)
Neutro Abs: 3.9 10*3/uL (ref 1.4–7.7)
Neutrophils Relative %: 63.8 % (ref 43.0–77.0)
Platelets: 206 10*3/uL (ref 150.0–400.0)
RBC: 4.96 Mil/uL (ref 4.22–5.81)
RDW: 13.1 % (ref 11.5–15.5)
WBC: 6.1 10*3/uL (ref 4.0–10.5)

## 2021-12-26 LAB — PSA: PSA: 1.62 ng/mL (ref 0.10–4.00)

## 2021-12-26 LAB — TESTOSTERONE: Testosterone: 360.74 ng/dL (ref 300.00–890.00)

## 2021-12-26 NOTE — Progress Notes (Signed)
Phone: [phone]   Subjective:  Patient presents today for their annual physical. Chief complaint-noted.   See problem oriented charting- ROS- full  review of systems was completed and negative  except for: low libido, hearing loss-stable tinnitus- stable, allergies  The following were reviewed and entered/updated in epic: Past Medical History:  Diagnosis Date   Aortic stenosis    Aortic valve prosthesis present    Dissecting aortic aneurysm (any part), thoracic (HCC)    Diverticulosis    Gastric ulcer    due to nsaids in past   Hypercholesterolemia    Mechanical heart valve present    Patient Active Problem List   Diagnosis Date Noted   Ascending aortic aneurysm (HCC) 10/18/2010    Priority: High   Hx of mechanical aortic valve replacement     Priority: High   Aortic atherosclerosis (HCC) 12/25/2020    Priority: Medium    History of adenomatous polyp of colon 12/11/2015    Priority: Medium    GERD (gastroesophageal reflux disease) 04/22/2011    Priority: Medium    Hypercholesterolemia     Priority: Medium    Acquired coagulation disorder (HCC) 12/23/2019    Priority: Low   Encounter for therapeutic drug monitoring 12/08/2015    Priority: Low   Long term (current) use of anticoagulants 05/08/2010    Priority: Low   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 81 in 2019.    Coronary artery disease Father        aortic valve replacement   Skin cancer Father         70 in 2019.    Healthy Sister    Healthy Brother        overweight   Schizophrenia Sister        home  health care   GI problems Neg Hx     Medications- reviewed and updated Current Outpatient Medications  Medication Sig Dispense Refill   MULTIPLE VITAMIN PO Take 1 tablet by mouth daily.      pantoprazole (PROTONIX) 20 MG tablet Take 1 tablet (20 mg total) by mouth daily as needed for heartburn or indigestion. 30 tablet 1   rosuvastatin (CRESTOR) 20 MG tablet TAKE 1 TABLET(20 MG) BY MOUTH DAILY 90 tablet 4   warfarin (COUMADIN) 5 MG tablet TALE 1/2 TO 1 TABLET BY MOUTH DAILY AS DIRECTED BY COUMADIN CLINIC 70 tablet 1   No current facility-administered medications for this visit.    Allergies-reviewed and updated Allergies  Allergen Reactions   Quinolones     Patient was warned about not using Cipro and similar antibiotics. Recent studies have raised concern that fluoroquinolone antibiotics could be associated with an increased risk of aortic aneurysm Fluoroquinolones have non-antimicrobial properties that might jeopardise the integrity of the extracellular matrix of the vascular wall In a  propensity score matched cohort study in [place], there was a 66% increased rate of aortic aneurysm or dissection associated with oral fluoroquinolone use, compared wit   Penicillins Rash    Reaction unknown may have been  a rash    Social History   Social History Narrative   Single. Lives with parents right now.       Undergrad at [employer], Masters oral roberts- divinity. Hard to get into ministry.    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 Oropharynx normal Neck: no thyromegaly CV: RRR no murmurs rubs or  gallops Lungs: CTAB no crackles, wheeze, rhonchi Abdomen: soft/nontender/nondistended/normal bowel sounds. No rebound or guarding.  Ext: no edema Skin: warm, dry Neuro: grossly normal, moves all extremities, PERRLA    Assessment and Plan  66 y.o. male presenting for annual physical.  Health Maintenance counseling: 1. Anticipatory guidance: Patient counseled regarding regular dental exams -q6 months, eye exams -yearly,  avoiding smoking and second hand smoke , limiting alcohol to 2 beverages per day , no illicit drugs .   2. Risk factor reduction:  Advised patient of need for regular exercise and diet rich and fruits and vegetables to reduce risk of heart attack and stroke.  Exercise- walking 2-3 days a week outside of work for 2 miles- plans on 4-5 -plus 3 miles a day at work.  Diet/weight management-soda 5 days a week 1-2- we discussed cutting this down. Weight up 7 lbs from last week.  Wt Readings from Last 3 Encounters:  12/26/21 165 lb 6.4 oz (75 kg)  09/21/21 164 lb 3.2 oz (74.5 kg)  03/19/21 161 lb 6.4 oz (73.2 kg)  3. Immunizations/screenings/ancillary studies- prevnar 20 and flu shot today , consider rsv at pharmacy, wants to hold off on further covid shots- had bad reaction to 2nd shot- shock like sensation in both legs Immunization History  Administered Date(s) Administered   Fluad Quad(high Dose 65+) 12/26/2021   Influenza Inj Mdck Quad Pf 09/17/2018   Influenza,inj,Quad PF,6+ Mos 11/21/2014, 08/10/2015, 12/12/2016, 10/04/2017, 10/25/2020   Moderna Sars-Covid-2 Vaccination 05/12/2019, 06/09/2019   PNEUMOCOCCAL CONJUGATE-20 12/26/2021   Tdap 11/21/2014   Zoster Recombinat (Shingrix) 12/15/2017, 03/18/2018  4. Prostate cancer screening- update psa with labs- nocturia nightly. Psa 4 years ago of 1.85 and gave option for early test last year and opted out- will check today  Lab Results  Component Value Date   PSA 1.79 12/25/2020   PSA 1.25 12/23/2019   PSA 1.27 12/17/2018    5. Colon cancer screening - 08/16/15 with 10 year repeat- last polyp hyperplastic 6. Skin cancer screening- last seen 2 years ago - plans to schedule. advised regular sunscreen use. Denies worrisome, changing, or new skin lesions.  7. Smoking associated screening (lung cancer screening, AAA screen 65-75, UA)- former smoker- quit at 66 years old - 2.5 pack years. Ordered AAA screen 8. STD screening - not dating- opts out  Status of chronic or acute concerns   # History of mechanical aortic valve replacement in 1994-follows with Dr. 26 who monitors echocardiogram -Remains on long-term Coumadin for anticoagulation- aquired coagulation disorder as result of mechanical valve noted -Antibiotics before dental procedures recommended   # Ascending aortic aneurysm S:Measuring at 4.8 cm on CT angiogram March  2023-followed by cardiothoracic surgery. A/P: stable overall- continue to monitor with   -discussed sometimes push <120 for aneurysm- but per last note Dr. Maren Beach "keep the systolic pressure less than 140 with regular blood pressure checks. " And at that goal  #hyperlipidemia #aortic atherosclerosis S: Medication:Rosuvastatin 20 mg Lab Results  Component Value Date   CHOL 148 12/25/2020   HDL 50.70 12/25/2020   LDLCALC 80 12/25/2020   TRIG 88.0 12/25/2020   CHOLHDL 3 12/25/2020   A/P: hopefully stable or improved- update lipid today. Continue current meds for now- could push for LDL under 70 with aortic atherosclerosis  # GERD S:Medication: Pantoprazole 20 mg only 2-3 per year B12 levels related to PPI use: no recent check prior to December 2023- opted out with infrquent use  A/P: stable- continue current sparing medicines    #has mild fatigue. Has had some drop in libido- we will check testosterone  Recommended follow up: Return in about 1 year (around 12/27/2022) for physical or sooner if needed.Schedule b4 you leave. Future Appointments  Date Time Provider Department Center   01/21/2022  8:45 AM [facility] None   Lab/Order associations:- fasting- black coffee only- very close to 8 am   ICD-10-CM   1. Preventative health care  Z00.00     2. Hypercholesterolemia  E78.00 CBC with Differential/Platelet    Comprehensive metabolic panel    Lipid panel    3. Nocturia  R35.1 PSA    4. High risk medication use  Z79.899 CANCELED: B12    5. Acquired coagulation disorder (HCC) Chronic D68.9     6. Aneurysm of ascending aorta without rupture (HCC)  I71.21     7. Hx of mechanical aortic valve replacement  Z95.2     8. Need for pneumococcal 20-valent conjugate vaccination  Z23 Pneumococcal conjugate vaccine 20-valent (Prevnar 20)    9. Need for immunization against influenza  Z23 Flu Vaccine QUAD High Dose(Fluad)    10. Low libido  R68.82 Testosterone    11. Former smoker  Z87.891       No orders of the defined types were placed in this encounter.   Return precautions advised.  Tana Conch, MD

## 2021-12-26 NOTE — Patient Instructions (Addendum)
We will call you within two weeks about your referral for aneurysm screening of abdominal aorta through [facility].  Their phone number is [phone].  Please call them if you have not heard in 1-2 weeks   Please stop by lab before you go If you have mychart- we will send your results within 3 business days of [place] receiving them.  If you do not have mychart- we will call you about results within 5 business days of [place] receiving them.  *please also note that you will see labs on mychart as soon as they post. I will later go in and write notes on them- will say "notes from Dr. Durene Cal"   Recommended follow up: Return in about 6 months (around 06/27/2022) for welcome to medicare.

## 2022-01-15 ENCOUNTER — Ambulatory Visit (INDEPENDENT_AMBULATORY_CARE_PROVIDER_SITE_OTHER): Payer: Medicare Other | Admitting: Internal Medicine

## 2022-01-15 ENCOUNTER — Encounter: Payer: Self-pay | Admitting: Internal Medicine

## 2022-01-15 VITALS — BP 138/73 | HR 76 | Temp 97.3°F | Ht 69.0 in | Wt 163.2 lb

## 2022-01-15 DIAGNOSIS — J321 Chronic frontal sinusitis: Secondary | ICD-10-CM | POA: Diagnosis not present

## 2022-01-15 MED ORDER — SIMPLY SALINE 0.9 % NA AERS: 2.0000 | INHALATION_SPRAY | Freq: Every day | NASAL | 1 refills | Status: AC | PRN

## 2022-01-15 MED ORDER — AZITHROMYCIN 250 MG PO TABS: ORAL_TABLET | ORAL | 0 refills | Status: DC

## 2022-01-15 MED ORDER — FLUTICASONE PROPIONATE 50 MCG/ACT NA SUSP: 2.0000 | Freq: Every day | NASAL | 6 refills | Status: DC

## 2022-01-15 MED ORDER — AZITHROMYCIN 250 MG PO TABS: ORAL_TABLET | ORAL | 0 refills | Status: AC

## 2022-01-15 MED ORDER — LORATADINE 10 MG PO TABS: 10.0000 mg | ORAL_TABLET | Freq: Every day | ORAL | 11 refills | Status: DC

## 2022-01-15 NOTE — Progress Notes (Signed)
[facility]: [phone]   Acute Care Medical Office Visit  Patient:  Ryan Lamb      Age: 67 y.o.       Sex:  male  Date:   01/15/2022  PCP:    Marin Olp, [place] Provider: Loralee Pacas, MD  Assessment/Plan:     ICD-10-CM   1. Chronic frontal sinusitis  J32.1 fluticasone (FLONASE) 50 MCG/ACT nasal spray    Saline (SIMPLY SALINE) 0.9 % AERS    loratadine (CLARITIN) 10 MG tablet    azithromycin (ZITHROMAX) 250 MG tablet    DISCONTINUED: azithromycin (ZITHROMAX) 250 MG tablet      Pleasant gentleman with persistent sinus symptoms after flu-like illness that was COVID negative - in setting of mechanical heart valve.  Prefers Z-pack to Augmentin due to history it helping- so I sent that with coumadin precautions- cut in half and do close follow up with INR clinic. Avoid sudafed due to heart valve encouraged sinus rinses to help antibiotic(s) work.  Sinus infection/Sinusitis Possibly Bacterial based on: Symptoms >10 days, still might be viral or allergic.   Infection Control:  daily sinus rinsing, hand hygiene, cough hygiene, masks, and quarantining mitigate the spread of illness.  Follow up as needed, based on symptoms and resolution, go to ER if any difficulty catching breath.     Subjective:   Ryan Lamb is a 67 y.o. male with past medical history significant for Patient Active Problem List   Diagnosis Date Noted   Idiopathic medial aortopathy and arteriopathy ([place]) 03/19/2021   Aortic atherosclerosis ([place]) 12/25/2020   Acquired coagulation disorder ([place]) 12/23/2019   History of adenomatous polyp of colon 12/11/2015   Encounter for therapeutic drug monitoring 12/08/2015   GERD (gastroesophageal reflux disease) 04/22/2011   Ascending aortic aneurysm ([place]) 10/18/2010   Long term (current) use of anticoagulants 05/08/2010   Hx of mechanical aortic valve replacement    Hypercholesterolemia      Reviewed prior medications as listed: Outpatient Medications Prior to Visit  Medication Sig   MULTIPLE VITAMIN PO Take 1 tablet by mouth daily.    pantoprazole (PROTONIX) 20 MG tablet Take 1 tablet (20 mg total) by mouth daily as needed for heartburn or indigestion.   rosuvastatin (CRESTOR) 20 MG tablet TAKE 1 TABLET(20 MG) BY MOUTH DAILY   warfarin (COUMADIN) 5 MG tablet TALE 1/2 TO 1 TABLET BY MOUTH DAILY AS DIRECTED BY COUMADIN CLINIC   No facility-administered medications prior to visit.     He presented today for evaluation of: Chief Complaint  Patient presents with   Productive cough    Symptoms for about  week and a half-brownish green mucus.   Slightly sore throat    Looks abnormal per patient-lesions on each side.   Post nasal drip   Watery eyes     HPI  2 days before christmas had flu and pneumonia shot same day. 5 days later got the flu.  Feels like its pretty much over but feels still having some residual effect(s) throat feels congested, occasional drippy nose, drops of congestion and stufff like that in the sternal area, " maybe I'm right at the tail end of it but maybe I need something else to knock it out.   Reason he felt its flu is that COVID test was negative.  In other words he reports having a mostly resolved flu like illness.   Needs to know if safe to return to  work and if he needs to take any medications.   -other symptoms include: flulike have mostly resolved but lots of sinus symptoms -first symptoms of this illness appeared 11 days ago -sinus Symptoms show no change recently -previous treatments: OTC only -sick contacts/travel/risks: denies flu/COVID exposure.  -Hx of: allergies, upper respiratory infections        Objective:  BP 138/73 (BP Location: Right Arm, Patient Position: Sitting)   Pulse 76   Temp (!) 97.3 F (36.3 C) (Temporal)   Ht 5\' 9"  (1.753 m)   Wt 163 lb 3.2 oz (74 kg)   SpO2 100%   BMI 24.10 kg/m  Physical Exam was problem  focused only:  Well developed, well nourished male in no acute distress no acute distress,  awake alert and oriented, uncomfortable but not toxic-appearing  normal work of breathing Problem-specific findings:  stuffy sounding, wearing mask  Oral mucosa was moist without significant tonsillar swelling or exudate  Results Reviewed: Results for orders placed or performed in visit on 12/26/21  CBC with Differential/Platelet  Result Value Ref Range   WBC 6.1 4.0 - 10.5 K/uL   RBC 4.96 4.22 - 5.81 Mil/uL   Hemoglobin 15.4 13.0 - 17.0 g/dL   HCT 12/28/21 10.9 - 32.3 %   MCV 89.1 78.0 - 100.0 fl   MCHC 34.9 30.0 - 36.0 g/dL   RDW 55.7 32.2 - 02.5 %   Platelets 206.0 150.0 - 400.0 K/uL   Neutrophils Relative % 63.8 43.0 - 77.0 %   Lymphocytes Relative 23.6 12.0 - 46.0 %   Monocytes Relative 7.5 3.0 - 12.0 %   Eosinophils Relative 4.2 0.0 - 5.0 %   Basophils Relative 0.9 0.0 - 3.0 %   Neutro Abs 3.9 1.4 - 7.7 K/uL   Lymphs Abs 1.4 0.7 - 4.0 K/uL   Monocytes Absolute 0.5 0.1 - 1.0 K/uL   Eosinophils Absolute 0.3 0.0 - 0.7 K/uL   Basophils Absolute 0.1 0.0 - 0.1 K/uL  Comprehensive metabolic panel  Result Value Ref Range   Sodium 144 135 - 145 mEq/L   Potassium 3.6 3.5 - 5.1 mEq/L   Chloride 107 96 - 112 mEq/L   CO2 28 19 - 32 mEq/L   Glucose, Bld 86 70 - 99 mg/dL   BUN 13 6 - 23 mg/dL   Creatinine, Ser 42.7 0.40 - 1.50 mg/dL   Total Bilirubin 0.8 0.2 - 1.2 mg/dL   Alkaline Phosphatase 62 39 - 117 U/L   AST 26 0 - 37 U/L   ALT 27 0 - 53 U/L   Total Protein 6.7 6.0 - 8.3 g/dL   Albumin 4.4 3.5 - 5.2 g/dL   GFR 0.62 37.62 mL/min   Calcium 9.4 8.4 - 10.5 mg/dL  Lipid panel  Result Value Ref Range   Cholesterol 165 0 - 200 mg/dL   Triglycerides >83.15 0.0 - 149.0 mg/dL   HDL 176.1 60.73 mg/dL   VLDL >71.06 0.0 - 26.9 mg/dL   LDL Cholesterol 87 0 - 99 mg/dL   Total CHOL/HDL Ratio 3    NonHDL 112.77   PSA  Result Value Ref Range   PSA 1.62 0.10 - 4.00 ng/mL  Testosterone  Result  Value Ref Range   Testosterone 360.74 300.00 - 890.00 ng/dL      48.5, MD

## 2022-01-17 ENCOUNTER — Ambulatory Visit
Admission: RE | Admit: 2022-01-17 | Discharge: 2022-01-17 | Disposition: A | Discharge status: Home / Self Care | Payer: Self-pay | Source: Ambulatory Visit | Attending: Family Medicine | Admitting: Family Medicine

## 2022-01-17 DIAGNOSIS — Z87891 Personal history of nicotine dependence: Secondary | ICD-10-CM | POA: Diagnosis not present

## 2022-01-17 DIAGNOSIS — Z136 Encounter for screening for cardiovascular disorders: Secondary | ICD-10-CM | POA: Diagnosis not present

## 2022-01-21 ENCOUNTER — Ambulatory Visit: Payer: Medicare Other | Attending: Cardiology

## 2022-01-21 DIAGNOSIS — Z5181 Encounter for therapeutic drug level monitoring: Secondary | ICD-10-CM

## 2022-01-21 DIAGNOSIS — Z952 Presence of prosthetic heart valve: Secondary | ICD-10-CM | POA: Diagnosis not present

## 2022-01-21 LAB — POCT INR: INR: 1.6 — AB (ref 2.0–3.0)

## 2022-01-21 NOTE — Patient Instructions (Signed)
Description   Take 2 tablets today and 1 tablet tomorrow and then resume taking Warfarin 1/2 tablet daily except 1 tablet on Mondays and Fridays.  Recheck INR in 2 weeks.  Call us with any medications Changes or concerns # [phone]

## 2022-01-24 DIAGNOSIS — H524 Presbyopia: Secondary | ICD-10-CM | POA: Diagnosis not present

## 2022-01-24 DIAGNOSIS — H25813 Combined forms of age-related cataract, bilateral: Secondary | ICD-10-CM | POA: Diagnosis not present

## 2022-02-04 ENCOUNTER — Ambulatory Visit: Payer: Medicare Other

## 2022-02-12 ENCOUNTER — Ambulatory Visit: Payer: Medicare Other | Attending: Internal Medicine

## 2022-02-12 DIAGNOSIS — Z5181 Encounter for therapeutic drug level monitoring: Secondary | ICD-10-CM | POA: Diagnosis not present

## 2022-02-12 DIAGNOSIS — Z952 Presence of prosthetic heart valve: Secondary | ICD-10-CM | POA: Diagnosis not present

## 2022-02-12 LAB — POCT INR: INR: 3 (ref 2.0–3.0)

## 2022-02-12 NOTE — Patient Instructions (Signed)
resume taking Warfarin 1/2 tablet daily except 1 tablet on Mondays and Fridays.  Recheck INR in 4 weeks (per pt request, aware of risk).  Call us with any medications Changes or concerns # [phone]

## 2022-03-11 ENCOUNTER — Ambulatory Visit (INDEPENDENT_AMBULATORY_CARE_PROVIDER_SITE_OTHER): Payer: Medicare Other

## 2022-03-11 ENCOUNTER — Ambulatory Visit (INDEPENDENT_AMBULATORY_CARE_PROVIDER_SITE_OTHER): Payer: Medicare Other | Admitting: Sports Medicine

## 2022-03-11 VITALS — BP 120/80 | HR 95 | Ht 69.0 in | Wt 163.0 lb

## 2022-03-11 DIAGNOSIS — M25562 Pain in left knee: Secondary | ICD-10-CM

## 2022-03-11 MED ORDER — METHYLPREDNISOLONE 4 MG PO TBPK: ORAL_TABLET | ORAL | 0 refills | Status: DC

## 2022-03-11 NOTE — Patient Instructions (Addendum)
Good to see you  Prednisone dos pak  Knee HEP  As needed follow up , if no improvement 3-4 week follow up

## 2022-03-11 NOTE — Progress Notes (Signed)
Ryan Lamb D.[facility] Phone: [phone]   Assessment and Plan:     1. Acute pain of left knee -Acute, uncomplicated, initial sports medicine visit - Acute left knee pain likely occurring due to unusual force put on knee while patient was getting into bed.  No red flag symptoms on today's physical exam - X-ray obtained in clinic.  My interpretation: No acute fracture or dislocation.  Mildly decreased medial joint space and lateral patellar spurring - Start prednisone Dosepak. - Discussed that I do not recommend NSAIDs with past medical history of anticoagulation on warfarin - May use Tylenol for breakthrough pain - Start HEP for knee - DG Knee AP/LAT W/Sunrise Left; Future    Pertinent previous records reviewed include none   Follow Up: As needed if no improvement or worsening of symptoms in 3 to 4 weeks.  Could consider physical therapy versus CSI   Subjective:   I, Ryan Lamb, am serving as a Education administrator for [name]  Chief Complaint: left knee pain   HPI:   03/11/22 Patient is a 67 year old male complaining of left knee pain. Patient states that Saturday he was getting into bed and swung his leg over and hit a stack of blankets had a shooting pain in his foot , Sunday he noted an antalgic gait, he does not feel steady, has been taking Tylenol for the pain and that has helped, anterior knee pain, no numbness or tingling ,no locking clicking or popping, no radiating pain.  Relevant Historical Information: Chronically anticoagulated on warfarin  Additional pertinent review of systems negative.   Current Outpatient Medications:    fluticasone (FLONASE) 50 MCG/ACT nasal spray, Place 2 sprays into both nostrils daily., Disp: 16 g, Rfl: 6   loratadine (CLARITIN) 10 MG tablet, Take 1 tablet (10 mg total) by mouth daily., Disp: 30 tablet, Rfl: 11   methylPREDNISolone (MEDROL DOSEPAK) 4  MG TBPK tablet, Take 6 tablets on day 1.  Take 5 tablets on day 2.  Take 4 tablets on day 3.  Take 3 tablets on day 4.  Take 2 tablets on day 5.  Take 1 tablet on day 6., Disp: 21 tablet, Rfl: 0   MULTIPLE VITAMIN PO, Take 1 tablet by mouth daily. , Disp: , Rfl:    pantoprazole (PROTONIX) 20 MG tablet, Take 1 tablet (20 mg total) by mouth daily as needed for heartburn or indigestion., Disp: 30 tablet, Rfl: 1   rosuvastatin (CRESTOR) 20 MG tablet, TAKE 1 TABLET(20 MG) BY MOUTH DAILY, Disp: 90 tablet, Rfl: 4   Saline (SIMPLY SALINE) 0.9 % AERS, Place 2 each into the nose daily as needed., Disp: 500 mL, Rfl: 1   warfarin (COUMADIN) 5 MG tablet, TALE 1/2 TO 1 TABLET BY MOUTH DAILY AS DIRECTED BY COUMADIN CLINIC, Disp: 70 tablet, Rfl: 1   Objective:     Vitals:   03/11/22 1307  BP: 120/80  Pulse: 95  SpO2: 96%  Weight: 163 lb (73.9 kg)  Height: '5\' 9"'$  (1.753 m)      Body mass index is 24.07 kg/m.    Physical Exam:    General:  awake, alert oriented, no acute distress nontoxic Skin: no suspicious lesions or rashes Neuro:sensation intact and strength 5/5 with no deficits, no atrophy, normal muscle tone Psych: No signs of anxiety, depression or other mood disorder  Left knee: No swelling No deformity Neg fluid wave, joint milking ROM  Flex 110, Ext 0 TTP posterior fossa NTTP over the quad tendon, medial fem condyle, lat fem condyle, patella, plica, patella tendon, tibial tuberostiy, fibular head,  , pes anserine bursa, gerdy's tubercle, medial jt line, lateral jt line Neg anterior and posterior drawer Neg lachman Neg sag sign Negative varus stress Negative valgus stress Negative McMurray Positive [place]  Gait normal    Electronically signed by:  Ryan Lamb D.[facility] 1:26 PM 03/11/22

## 2022-03-12 ENCOUNTER — Ambulatory Visit: Payer: Medicare Other

## 2022-03-13 ENCOUNTER — Ambulatory Visit (INDEPENDENT_AMBULATORY_CARE_PROVIDER_SITE_OTHER): Payer: Medicare Other | Admitting: Family Medicine

## 2022-03-13 ENCOUNTER — Encounter: Payer: Self-pay | Admitting: Family Medicine

## 2022-03-13 VITALS — BP 110/68 | HR 84 | Temp 97.3°F | Ht 69.0 in | Wt 166.2 lb

## 2022-03-13 DIAGNOSIS — M25562 Pain in left knee: Secondary | ICD-10-CM

## 2022-03-13 NOTE — Patient Instructions (Signed)
I wish you the best with healing- follow up with DR. Glennon Mac if not resolvoing within 3-4 weeks as he mentioned

## 2022-03-13 NOTE — Progress Notes (Signed)
Erroneous encounter/visit  # Left knee pain S:patient reports hurting his left knee Saturday night  (had active day) when getting into bed- was using guest bed which is higher than other beds- hit blankets with left knee and felt a buckling sensation in the left knee and was in a lot of pain that evening.  Patient saw Dr. Glennon Mac on March 11, 2022-x-ray was obtained and no fracture or dislocation was noted.  Was started on a steroid Dosepak-NSAIDs recommended against due to anticoagulation with warfarin.  Was given home exercise program.  Plan was for follow-up in 3 to 4 weeks if not improving  He was noting improvement and able to work yesterday A/P: He was told needed an office visit follow up after seeing Dr. Glennon Mac- I oinformed him he did not in fact need that- he is improving and has no needs today- we will not charge for our visit   Return precautions advised.  Garret Reddish, MD

## 2022-03-18 ENCOUNTER — Other Ambulatory Visit: Payer: Self-pay | Admitting: Cardiothoracic Surgery

## 2022-03-18 DIAGNOSIS — I7121 Aneurysm of the ascending aorta, without rupture: Secondary | ICD-10-CM

## 2022-03-20 ENCOUNTER — Ambulatory Visit: Payer: Medicare Other | Attending: Cardiology

## 2022-03-20 DIAGNOSIS — Z5181 Encounter for therapeutic drug level monitoring: Secondary | ICD-10-CM | POA: Diagnosis not present

## 2022-03-20 DIAGNOSIS — Z952 Presence of prosthetic heart valve: Secondary | ICD-10-CM | POA: Diagnosis not present

## 2022-03-20 LAB — POCT INR: INR: 3.9 — AB (ref 2.0–3.0)

## 2022-03-20 NOTE — Patient Instructions (Signed)
Description   HOLD today's dose and then resume taking Warfarin 1/2 tablet daily except 1 tablet on Mondays and Fridays.  Recheck INR in 3 weeks.  Call us with any medications Changes or concerns # [phone]

## 2022-03-25 ENCOUNTER — Other Ambulatory Visit: Payer: Self-pay | Admitting: *Deleted

## 2022-03-25 DIAGNOSIS — Z952 Presence of prosthetic heart valve: Secondary | ICD-10-CM

## 2022-03-25 MED ORDER — WARFARIN SODIUM 5 MG PO TABS: ORAL_TABLET | ORAL | 1 refills | Status: DC

## 2022-03-25 NOTE — Telephone Encounter (Signed)
Warfarin 5mg  refill Hx of mechanical aortic valve replacement  Last INR 03/20/22 Last OV 09/21/21

## 2022-04-08 ENCOUNTER — Ambulatory Visit: Payer: Medicare Other | Admitting: Sports Medicine

## 2022-04-10 ENCOUNTER — Ambulatory Visit: Payer: Medicare Other | Attending: Cardiology | Admitting: *Deleted

## 2022-04-10 DIAGNOSIS — Z952 Presence of prosthetic heart valve: Secondary | ICD-10-CM

## 2022-04-10 DIAGNOSIS — Z5181 Encounter for therapeutic drug level monitoring: Secondary | ICD-10-CM

## 2022-04-10 LAB — POCT INR: INR: 4.5 — AB (ref 2.0–3.0)

## 2022-04-10 NOTE — Patient Instructions (Addendum)
Description   Do not take any warfarin and no warfarin tomorrow then START taking Warfarin 1/2 tablet daily except 1 tablet on Fridays. Recheck INR in 3 weeks.  Call us with any medications Changes or concerns # [phone]

## 2022-04-23 ENCOUNTER — Encounter: Payer: Self-pay | Admitting: Cardiothoracic Surgery

## 2022-04-24 ENCOUNTER — Ambulatory Visit: Payer: Medicare Other | Admitting: Cardiothoracic Surgery

## 2022-04-24 ENCOUNTER — Other Ambulatory Visit: Payer: Self-pay | Admitting: Cardiothoracic Surgery

## 2022-04-24 ENCOUNTER — Ambulatory Visit
Admission: RE | Admit: 2022-04-24 | Discharge: 2022-04-24 | Disposition: A | Discharge status: Home / Self Care | Payer: Medicare Other | Source: Ambulatory Visit | Attending: Cardiothoracic Surgery | Admitting: Cardiothoracic Surgery

## 2022-04-24 ENCOUNTER — Encounter: Payer: Self-pay | Admitting: Cardiothoracic Surgery

## 2022-04-24 VITALS — BP 136/75 | HR 90 | Resp 20 | Ht 69.0 in | Wt 168.0 lb

## 2022-04-24 DIAGNOSIS — Q231 Congenital insufficiency of aortic valve: Secondary | ICD-10-CM | POA: Diagnosis not present

## 2022-04-24 DIAGNOSIS — I7121 Aneurysm of the ascending aorta, without rupture: Secondary | ICD-10-CM | POA: Diagnosis not present

## 2022-04-24 DIAGNOSIS — Q2381 Bicuspid aortic valve: Secondary | ICD-10-CM | POA: Insufficient documentation

## 2022-04-24 DIAGNOSIS — R911 Solitary pulmonary nodule: Secondary | ICD-10-CM

## 2022-04-24 DIAGNOSIS — R918 Other nonspecific abnormal finding of lung field: Secondary | ICD-10-CM | POA: Diagnosis not present

## 2022-04-24 DIAGNOSIS — I712 Thoracic aortic aneurysm, without rupture, unspecified: Secondary | ICD-10-CM | POA: Diagnosis not present

## 2022-04-24 DIAGNOSIS — I2510 Atherosclerotic heart disease of native coronary artery without angina pectoris: Secondary | ICD-10-CM | POA: Diagnosis not present

## 2022-04-24 DIAGNOSIS — Z9889 Other specified postprocedural states: Secondary | ICD-10-CM | POA: Diagnosis not present

## 2022-04-24 MED ORDER — IOPAMIDOL (ISOVUE-370) INJECTION 76%
75.0000 mL | Freq: Once | INTRAVENOUS | Status: AC | PRN
  Administered 2022-04-24: 75 mL via INTRAVENOUS

## 2022-04-24 NOTE — Progress Notes (Signed)
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  It has been stable at 4.8 cm since 2011 and has remained asymptomatic.  The patient's 23 mm Saint Jude mechanical valve is functioning well by most recent echo 6 months ago.  The remainder of the ascending aorta is unremarkable.  The patient's blood pressure is well-controlled.  Patient also has a history of a 6-7 mm nodule in the lingula also noted for several years.  He has a remote history of smoking.  Nodule now measures proximately 8 mm and shows a clear progression over the past several years consistent with a "indolent process" on the radiology report.  This is also asymptomatic.  Current Outpatient Medications  Medication Sig Dispense Refill   fluticasone (FLONASE) 50 MCG/ACT nasal spray Place 2 sprays into both nostrils daily. 16 g 6   loratadine (CLARITIN) 10 MG tablet Take 1 tablet (10 mg total) by mouth daily. 30 tablet 11   methylPREDNISolone (MEDROL DOSEPAK) 4 MG TBPK tablet Take 6 tablets on day 1.  Take 5 tablets on day 2.  Take 4 tablets on day 3.  Take 3 tablets on day 4.  Take 2 tablets on day 5.  Take 1 tablet on day 6. 21 tablet 0   MULTIPLE VITAMIN PO Take 1 tablet by mouth daily.      pantoprazole (PROTONIX) 20 MG tablet Take 1 tablet (20 mg total) by mouth daily as needed for heartburn or indigestion. 30 tablet 1   rosuvastatin (CRESTOR) 20 MG tablet TAKE 1 TABLET(20 MG) BY MOUTH DAILY 90 tablet 4   Saline (SIMPLY SALINE) 0.9 % AERS Place 2 each into the nose daily as needed. 500 mL 1   warfarin (COUMADIN) 5 MG tablet TALE 1/2 TO 1 TABLET BY MOUTH DAILY AS DIRECTED BY COUMADIN CLINIC 70 tablet 1   No current facility-administered medications for this visit.     Physical Exam: Blood pressure 136/75, pulse 90, resp. rate 20, height  (1.753 m), weight 168 lb (76.2 kg), SpO2 100 %.          Exam    General- alert and comfortable    Neck- no JVD, no cervical adenopathy palpable, no carotid bruit   Lungs- clear without rales, wheezes   Cor- regular rate and rhythm, 1-2/6 soft systolic flow murmur through the aortic valve, no gallop   Abdomen- soft, non-tender   Extremities - warm, non-tender, minimal edema   Neuro- oriented, appropriate, no focal weakness  Diagnostic Tests: Images of CT scan and most recent echocardiogram personally reviewed and discussed with the patient. The aortic root dilatation remained stable at 4.8 cm.  The lingular nodule shows slight increase in size to 8 mm.  Impression: The patient's aortic root and aortic valve replacement remained stable.  The patient does not meet criteria for replacement of the aortic root at a diameter of 4.8 cm.  Best therapy is continued blood pressure monitoring to keep systolic blood pressure less than 140.  He understands importance of good dental hygiene and antibiotic prophylaxis with a mechanical AVR.  The patient's left upper lobe nodule shows slow progression in size over the past several years now at 8 mm and will be assessed by PET scan to assess malignant potential.  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  But a PET scan will provide guidance on future assessment and management.  Plan: PET scan to assess the slow progression of the left upper lobe nodule and return visit to discuss findings.  Schedule annual surveillance CTA of the thoracic aorta April 2025.   Lovett Sox, MD [facility] [phone]

## 2022-05-01 ENCOUNTER — Ambulatory Visit: Payer: Medicare Other | Attending: Cardiology | Admitting: *Deleted

## 2022-05-01 DIAGNOSIS — Z5181 Encounter for therapeutic drug level monitoring: Secondary | ICD-10-CM

## 2022-05-01 DIAGNOSIS — Z952 Presence of prosthetic heart valve: Secondary | ICD-10-CM | POA: Diagnosis not present

## 2022-05-01 LAB — POCT INR: INR: 2.4 (ref 2.0–3.0)

## 2022-05-01 NOTE — Patient Instructions (Addendum)
Description   Today take 1 tablet of warfarin then continue taking Warfarin 1/2 tablet daily except 1 tablet on Fridays. Recheck INR in 4 weeks.  Call us with any medications Changes or concerns # [phone] or [phone]

## 2022-05-10 ENCOUNTER — Encounter (HOSPITAL_COMMUNITY)
Admission: RE | Admit: 2022-05-10 | Discharge: 2022-05-10 | Disposition: A | Discharge status: Home / Self Care | Payer: Medicare Other | Source: Ambulatory Visit | Attending: Cardiothoracic Surgery | Admitting: Cardiothoracic Surgery

## 2022-05-10 DIAGNOSIS — R911 Solitary pulmonary nodule: Secondary | ICD-10-CM | POA: Diagnosis not present

## 2022-05-10 LAB — GLUCOSE, CAPILLARY: Glucose-Capillary: 93 mg/dL (ref 70–99)

## 2022-05-10 MED ORDER — FLUDEOXYGLUCOSE F - 18 (FDG) INJECTION
9.5400 | Freq: Once | INTRAVENOUS | Status: AC
  Administered 2022-05-10: 8.36 via INTRAVENOUS

## 2022-05-13 ENCOUNTER — Ambulatory Visit: Payer: Medicare Other | Admitting: Cardiothoracic Surgery

## 2022-05-13 ENCOUNTER — Encounter: Payer: Self-pay | Admitting: Cardiothoracic Surgery

## 2022-05-13 VITALS — BP 125/73 | HR 80 | Resp 20 | Ht 69.0 in | Wt 168.0 lb

## 2022-05-13 DIAGNOSIS — I7121 Aneurysm of the ascending aorta, without rupture: Secondary | ICD-10-CM

## 2022-05-13 DIAGNOSIS — R911 Solitary pulmonary nodule: Secondary | ICD-10-CM | POA: Diagnosis not present

## 2022-05-13 NOTE — Progress Notes (Signed)
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  The nodule has slowly increased in size to 8 to 9 mm and the patient has a remote history of smoking.  PET scan images personally reviewed showing no increased activity in the region of the nodule.  An official radiology report has not yet been generated.  I told the patient the nodule shows no hypermetabolic activity and can be considered benign.  I will let him know if the radiologist report of the PET scan shows any other concerning findings.  He will continue with his annual surveillance CT scan of the thoracic aorta neck study April 2025.  Current Outpatient Medications  Medication Sig Dispense Refill   fluticasone (FLONASE) 50 MCG/ACT nasal spray Place 2 sprays into both nostrils daily. 16 g 6   loratadine (CLARITIN) 10 MG tablet Take 1 tablet (10 mg total) by mouth daily. 30 tablet 11   methylPREDNISolone (MEDROL DOSEPAK) 4 MG TBPK tablet Take 6 tablets on day 1.  Take 5 tablets on day 2.  Take 4 tablets on day 3.  Take 3 tablets on day 4.  Take 2 tablets on day 5.  Take 1 tablet on day 6. 21 tablet 0   MULTIPLE VITAMIN PO Take 1 tablet by mouth daily.      pantoprazole (PROTONIX) 20 MG tablet Take 1 tablet (20 mg total) by mouth daily as needed for heartburn or indigestion. 30 tablet 1   rosuvastatin (CRESTOR) 20 MG tablet TAKE 1 TABLET(20 MG) BY MOUTH DAILY 90 tablet 4   Saline (SIMPLY SALINE) 0.9 % AERS Place 2 each into the nose daily as needed. 500 mL 1   warfarin (COUMADIN) 5 MG tablet TALE 1/2 TO 1 TABLET BY MOUTH DAILY AS DIRECTED BY COUMADIN CLINIC 70 tablet 1   No current facility-administered medications for this visit.     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  Diagnostic Tests: PET scan images personally reviewed and the findings regarding the lingular nodule were reviewed with the patient.  No evidence of metabolic activity of the nodule consistent with neoplasm.  Impression: Continue surveillance CT scans of the thoracic aorta.  Good blood pressure control.  Return in April 2025.  With CT of chest.  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   Lovett Sox, MD [facility] [phone]

## 2022-05-20 ENCOUNTER — Other Ambulatory Visit: Payer: Self-pay | Admitting: General Surgery

## 2022-05-20 ENCOUNTER — Telehealth: Payer: Self-pay | Admitting: *Deleted

## 2022-05-20 DIAGNOSIS — R1900 Intra-abdominal and pelvic swelling, mass and lump, unspecified site: Secondary | ICD-10-CM | POA: Diagnosis not present

## 2022-05-20 DIAGNOSIS — Z7901 Long term (current) use of anticoagulants: Secondary | ICD-10-CM | POA: Diagnosis not present

## 2022-05-20 DIAGNOSIS — R222 Localized swelling, mass and lump, trunk: Secondary | ICD-10-CM | POA: Diagnosis not present

## 2022-05-20 DIAGNOSIS — Z952 Presence of prosthetic heart valve: Secondary | ICD-10-CM | POA: Diagnosis not present

## 2022-05-20 NOTE — Telephone Encounter (Signed)
   Pre-operative Risk Assessment    Patient Name: Ryan Lamb  DOB: February 27, 1955 MRN: [id number]      Request for Surgical Clearance    Procedure:   SOFT TISSUE MASS REMOVAL  Date of Surgery:  Clearance TBD                                 Surgeon:  DR. Almond Lint Surgeon's Group or Practice Name:  [facility] Phone number:  [phone] Fax number:  [phone] ATTN: Santiago Glad, CMA   Type of Clearance Requested:   - Medical  - Pharmacy:  Hold Warfarin (Coumadin)     Type of Anesthesia:  General    Additional requests/questions:    Elpidio Anis   05/20/2022, 3:08 PM

## 2022-05-21 ENCOUNTER — Telehealth: Payer: Self-pay | Admitting: *Deleted

## 2022-05-21 NOTE — Telephone Encounter (Signed)
Pt has been scheduled for tele pre op appt 05/28/22 @ 10:20. Med rec and consent are done.      Patient Consent for Virtual Visit        [facility] has provided verbal consent on 05/21/2022 for a virtual visit (video or telephone).   CONSENT FOR VIRTUAL VISIT FOR:  Ryan Lamb  By participating in this virtual visit I agree to the following:  I hereby voluntarily request, consent and authorize [facility] and its employed or contracted physicians, physician assistants, nurse practitioners or other licensed health care professionals (the Practitioner), to provide me with telemedicine health care services (the "Services") as deemed necessary by the treating Practitioner. I acknowledge and consent to receive the Services by the Practitioner via telemedicine. I understand that the telemedicine visit will involve communicating with the Practitioner through live audiovisual communication technology and the disclosure of certain medical information by electronic transmission. I acknowledge that I have been given the opportunity to request an in-person assessment or other available alternative prior to the telemedicine visit and am voluntarily participating in the telemedicine visit.  I understand that I have the right to withhold or withdraw my consent to the use of telemedicine in the course of my care at any time, without affecting my right to future care or treatment, and that the Practitioner or I may terminate the telemedicine visit at any time. I understand that I have the right to inspect all information obtained and/or recorded in the course of the telemedicine visit and may receive copies of available information for a reasonable fee.  I understand that some of the potential risks of receiving the Services via telemedicine include:  Delay or interruption in medical evaluation due to technological equipment failure or disruption; Information transmitted may  not be sufficient (e.g. poor resolution of images) to allow for appropriate medical decision making by the Practitioner; and/or  In rare instances, security protocols could fail, causing a breach of personal health information.  Furthermore, I acknowledge that it is my responsibility to provide information about my medical history, conditions and care that is complete and accurate to the best of my ability. I acknowledge that Practitioner's advice, recommendations, and/or decision may be based on factors not within their control, such as incomplete or inaccurate data provided by me or distortions of diagnostic images or specimens that may result from electronic transmissions. I understand that the practice of medicine is not an exact science and that Practitioner makes no warranties or guarantees regarding treatment outcomes. I acknowledge that a copy of this consent can be made available to me via my patient portal [facility] MyChart), or I can request a printed copy by calling the office of [facility].    I understand that my insurance will be billed for this visit.   I have read or had this consent read to me. I understand the contents of this consent, which adequately explains the benefits and risks of the Services being provided via telemedicine.  I have been provided ample opportunity to ask questions regarding this consent and the Services and have had my questions answered to my satisfaction. I give my informed consent for the services to be provided through the use of telemedicine in my medical care

## 2022-05-21 NOTE — Telephone Encounter (Signed)
Pt has been scheduled for tele pre op appt 05/28/22 @ 10:20. Med rec and consent are done.

## 2022-05-21 NOTE — Telephone Encounter (Signed)
Patient with diagnosis of mechanical aortic valve replacement on warfarin for anticoagulation.    Procedure:   SOFT TISSUE MASS REMOVAL  Date of procedure: TBD  Mechanical aortic valve with no other risk factors. Patient will NOT need bridge.  Per office protocol, patient can hold wardarin for 5 days prior to procedure.   Patient will NOT need bridging with Lovenox (enoxaparin) around procedure.  **This guidance is not considered finalized until pre-operative APP has relayed final recommendations.**

## 2022-05-21 NOTE — Telephone Encounter (Signed)
   Name: Ryan Lamb  DOB: 1955/01/16  MRN: [id number]  Primary Cardiologist: Tobias Alexander, MD  Chart reviewed as part of pre-operative protocol coverage. Because of Ryan Lamb's past medical history and time since last visit, he will require a follow-up telephone visit in order to better assess preoperative cardiovascular risk.  Pre-op covering staff: - Please schedule appointment and call patient to inform them. If patient already had an upcoming appointment within acceptable timeframe, please add "pre-op clearance" to the appointment notes so provider is aware. - Please contact requesting surgeon's office via preferred method (i.e, phone, fax) to inform them of need for appointment prior to surgery.  Per office protocol, patient can hold wardarin for 5 days prior to procedure.   Patient will NOT need bridging with Lovenox (enoxaparin) around procedure.    Sharlene Dory, PA-C  05/21/2022, 4:45 PM

## 2022-05-22 DIAGNOSIS — D2271 Melanocytic nevi of right lower limb, including hip: Secondary | ICD-10-CM | POA: Diagnosis not present

## 2022-05-22 DIAGNOSIS — L820 Inflamed seborrheic keratosis: Secondary | ICD-10-CM | POA: Diagnosis not present

## 2022-05-22 DIAGNOSIS — D2261 Melanocytic nevi of right upper limb, including shoulder: Secondary | ICD-10-CM | POA: Diagnosis not present

## 2022-05-22 DIAGNOSIS — D225 Melanocytic nevi of trunk: Secondary | ICD-10-CM | POA: Diagnosis not present

## 2022-05-22 DIAGNOSIS — L821 Other seborrheic keratosis: Secondary | ICD-10-CM | POA: Diagnosis not present

## 2022-05-28 ENCOUNTER — Ambulatory Visit: Payer: Medicare Other | Attending: Nurse Practitioner | Admitting: Nurse Practitioner

## 2022-05-28 DIAGNOSIS — Z01810 Encounter for preprocedural cardiovascular examination: Secondary | ICD-10-CM | POA: Diagnosis not present

## 2022-05-28 NOTE — Progress Notes (Signed)
Virtual Visit via Telephone Note   Because of Ryan Lamb's co-morbid illnesses, he is at least at moderate risk for complications without adequate follow up.  This format is felt to be most appropriate for this patient at this time.  The patient did not have access to video technology/had technical difficulties with video requiring transitioning to audio format only (telephone).  All issues noted in this document were discussed and addressed.  No physical exam could be performed with this format.  Please refer to the patient's chart for his consent to telehealth for [facility].  Evaluation Performed:  Preoperative cardiovascular risk assessment _____________   Date:  05/28/2022   Patient ID:  Ryan Lamb, DOB May 31, 1955, MRN [id number] Patient Location:  Home Provider location:   Office  Primary Care Provider:  Shelva Majestic, MD Primary Cardiologist:  None  Chief Complaint / Patient Profile   67 y.o. y/o male with a h/o aortic valvuloplasty in 1971, mechanical AVR in 1994 for for severe AS, ascending aortic aneurysm, and hyperlipidemia who is pending soft tissue mass removal with Dr. Almond Lint of [facility] surgery and presents today for telephonic preoperative cardiovascular risk assessment.  History of Present Illness    Ryan Lamb is a 67 y.o. male who presents via audio/video conferencing for a telehealth visit today.  Pt was last seen in cardiology clinic on 09/21/2021 by Dr. Shari Prows.  At that time Ryan Lamb was doing well.  The patient is now pending procedure as outlined above. Since his last visit, he has done well from a cardiac standpoint.  He denies chest pain, palpitations, dyspnea, pnd, orthopnea, n, v, dizziness, syncope, edema, weight gain, or early satiety. All other systems reviewed and are otherwise negative except as noted above.   Past Medical History    Past Medical History:   Diagnosis Date   Aortic stenosis    Aortic valve prosthesis present    Dissecting aortic aneurysm (any part), thoracic (HCC)    Diverticulosis    Gastric ulcer    due to nsaids in past   Hypercholesterolemia    Mechanical heart valve present    Past Surgical History:  Procedure Laterality Date   CARDIAC VALVE REPLACEMENT     EEC     with propofol    ESOPHAGOGASTRODUODENOSCOPY  01/2010, 04/2010   ESOPHAGOGASTRODUODENOSCOPY     cauterize after nsaid related ulcer   EXPLORATION POST OPERATIVE OPEN HEART  1994, 1972    Allergies  Allergies  Allergen Reactions   Quinolones     Patient was warned about not using Cipro and similar antibiotics. Recent studies have raised concern that fluoroquinolone antibiotics could be associated with an increased risk of aortic aneurysm Fluoroquinolones have non-antimicrobial properties that might jeopardise the integrity of the extracellular matrix of the vascular wall In a  propensity score matched cohort study in [place], there was a 66% increased rate of aortic aneurysm or dissection associated with oral fluoroquinolone use, compared wit   Penicillins Rash    Reaction unknown may have been a rash    Home Medications    Prior to Admission medications   Medication Sig Start Date End Date Taking? Authorizing Provider  fluticasone (FLONASE) 50 MCG/ACT nasal spray Place 2 sprays into both nostrils daily. 01/15/22   Lula Olszewski, MD  loratadine (CLARITIN) 10 MG tablet Take 1 tablet (10 mg total) by mouth daily. 01/15/22   Lula Olszewski, MD  methylPREDNISolone (  MEDROL DOSEPAK) 4 MG TBPK tablet Take 6 tablets on day 1.  Take 5 tablets on day 2.  Take 4 tablets on day 3.  Take 3 tablets on day 4.  Take 2 tablets on day 5.  Take 1 tablet on day 6. 03/11/22   Richardean Sale, DO  MULTIPLE VITAMIN PO Take 1 tablet by mouth daily.     [provider]  pantoprazole (PROTONIX) 20 MG tablet Take 1 tablet (20 mg total) by mouth daily as needed for  heartburn or indigestion. 12/17/18   Shelva Majestic, MD  rosuvastatin (CRESTOR) 20 MG tablet TAKE 1 TABLET(20 MG) BY MOUTH DAILY 09/24/21   Shelva Majestic, MD  Saline (SIMPLY SALINE) 0.9 % AERS Place 2 each into the nose daily as needed. 01/15/22   Lula Olszewski, MD  warfarin (COUMADIN) 5 MG tablet TALE 1/2 TO 1 TABLET BY MOUTH DAILY AS DIRECTED BY COUMADIN CLINIC 03/25/22   Meriam Sprague, MD    Physical Exam    Vital Signs:  Ryan Lone Noack Lamb does not have vital signs available for review today.  Given telephonic nature of communication, physical exam is limited. AAOx3. NAD. Normal affect.  Speech and respirations are unlabored.  Accessory Clinical Findings    None  Assessment & Plan    1.  Preoperative Cardiovascular Risk Assessment:  According to the Revised Cardiac Risk Index (RCRI), his Perioperative Risk of Major Cardiac Event is (%): 0.4. His Functional Capacity in METs is: 9.89 according to the Duke Activity Status Index (DASI). Therefore, based on ACC/AHA guidelines, patient would be at acceptable risk for the planned procedure without further cardiovascular testing.  The patient was advised that if he develops new symptoms prior to surgery to contact our office to arrange for a follow-up visit, and he verbalized understanding.  Per office protocol, patient can hold warfarin for 5 days prior to procedure.   Patient will NOT need bridging with Lovenox (enoxaparin) around procedure. Please resume warfarin as soon as possible postprocedure, at the discretion of the surgeon.     A copy of this note will be routed to requesting surgeon.  Time:   Today, I have spent 5 minutes with the patient with telehealth technology discussing medical history, symptoms, and management plan.     Joylene Grapes, NP  05/28/2022, 10:37 AM

## 2022-05-29 ENCOUNTER — Ambulatory Visit (INDEPENDENT_AMBULATORY_CARE_PROVIDER_SITE_OTHER): Payer: Medicare Other

## 2022-05-29 DIAGNOSIS — Z952 Presence of prosthetic heart valve: Secondary | ICD-10-CM

## 2022-05-29 DIAGNOSIS — Z5181 Encounter for therapeutic drug level monitoring: Secondary | ICD-10-CM

## 2022-05-29 LAB — POCT INR: INR: 1.9 — AB (ref 2.0–3.0)

## 2022-05-29 NOTE — Patient Instructions (Addendum)
Description   Take 1 tablet of warfarin today, then start taking Warfarin 1/2 tablet daily except 1 tablet on Mondays and Fridays. Start holding Warfarin on 06/12/22, 5 days prior to procedure on 06/17/22.  Resume previous dosage regimen on 06/17/22 after procedure, if MD states ok to do so, 1/2 tablet daily except 1 tablet on Mondays and Fridays.  Recheck INR in 1 week post procedure.  Call us with any medications Changes or concerns # [phone] or [phone]

## 2022-06-10 ENCOUNTER — Other Ambulatory Visit: Payer: Self-pay

## 2022-06-10 ENCOUNTER — Encounter (HOSPITAL_BASED_OUTPATIENT_CLINIC_OR_DEPARTMENT_OTHER): Payer: Self-pay | Admitting: General Surgery

## 2022-06-10 NOTE — Progress Notes (Signed)
Chart reviewed with Dr. Chaney Malling, anesthesia department due to patients extensive cardiac history. Cardiology clearance in chart. Per Dr. Chaney Malling, ok to proceed with surgery as planned at [facility].

## 2022-06-13 NOTE — H&P (Signed)
REFERRING PHYSICIAN: Surgery, [facility] * Patient Care Team: Shelva Majestic, MD as PCP - General (Family Medicine) Ryan Lamb, Charlsie Quest, MD (Cardiothoracic Surgery) Matthias Hughs, MD as Consulting Provider (Surgical Oncology) Meriam Sprague, MD (Cardiovascular Disease)  PROVIDER: Matthias Hughs, MD MRN: [id number] DOB: 07/27/1955 Subjective   Chief Complaint: New Consultation ( dermatofibroma R abdomen and LLback, )  History of Present Illness: Ryan Lamb is a 67 y.o. male who is seen today as an office consultation for evaluation of New Consultation ( dermatofibroma R abdomen and LLback, )  Patient is referred by Dr. Donata Lamb for some bothersome soft tissue masses. These have been here for years but there are several of them that have started bothering him. He started having pain at 1 on his right abdominal wall as well as 1 on the left abdominal wall near the costal margin. There is another 1 on his lower right back over the paraspinous muscles and then 1 that is several centimeters above his right patella. He has numerous masses on bilateral upper and lower extremities as well as the trunk. These all have been here for a while, but he is only interested in getting the ones removed that have become bothersome. He thinks overall they are not dramatically different, but thinks that the right abdominal wall may have gotten larger. He denies any biopsy of these before.  He has not had a previous diagnosis of cancer, he has had a pulmonary nodule being followed by CT surgery as well as his prosthetic valve. He is on Coumadin for that.  Pet 05/10/22 IMPRESSION: 7 mm left upper lobe nodule, non FDG avid, and only minimally progressive since 2011. This is considered benign. No follow-up is recommended.  Status post aortic valve replacement. 4.8 cm ascending thoracic aortic aneurysm. Recommend semi-annual imaging followup by CTA or MRA and referral to  cardiothoracic surgery if not already obtained.   Review of Systems: A complete review of systems was obtained from the patient. I have reviewed this information and discussed as appropriate with the patient. See HPI as well for other ROS.  Review of Systems  HENT: Positive for tinnitus.    Medical History: Past Medical History:  Diagnosis Date  Aneurysm (CMS-HCC)  GERD (gastroesophageal reflux disease)  Heart valve disease   Patient Active Problem List  Diagnosis  Mass of soft tissue of abdomen  Mass on back  History of mechanical aortic valve replacement  Chronic anticoagulation   No past surgical history on file.   Allergies  Allergen Reactions  Penicillins Rash  Reaction unknown may have been a rash   Current Outpatient Medications on File Prior to Visit  Medication Sig Dispense Refill  rosuvastatin (CRESTOR) 20 MG tablet Take 20 mg by mouth once daily  warfarin (COUMADIN) 5 MG tablet TALE 1/2 TO 1 TABLET BY MOUTH DAILY AS DIRECTED BY COUMADIN CLINIC   No current facility-administered medications on file prior to visit.   Family History  Problem Relation Age of Onset  Coronary Artery Disease (Blocked arteries around heart) Mother  Skin cancer Father  Hyperlipidemia (Elevated cholesterol) Father  Coronary Artery Disease (Blocked arteries around heart) Father    Social History   Tobacco Use  Smoking Status Former  Types: Cigarettes  Smokeless Tobacco Never    Social History   Socioeconomic History  Marital status: Single  Tobacco Use  Smoking status: Former  Types: Cigarettes  Smokeless tobacco: Never  Substance and Sexual Activity  Alcohol use:  Not Currently  Drug use: Never   Objective:   Vitals:   BP: (!) 143/79  Pulse: 91  Temp: 36.7 C (98 F)  SpO2: 98%  Weight: 76.7 kg (169 lb)  Height: 175.3 cm (5\' 9" )   Body mass index is 24.96 kg/m.  Head: Normocephalic and atraumatic.  Eyes: Conjunctivae are normal. Pupils are equal, round,  and reactive to light. No scleral icterus.  Neck: Normal range of motion. Neck supple. No tracheal deviation present. No thyromegaly present.  Resp: No respiratory distress, normal effort. Abd: Abdomen is soft, non distended and non tender. No masses are palpable. There is no rebound and no guarding.  Neurological: Alert and oriented to person, place, and time. Coordination normal.  Skin: Skin is warm and dry. No rash noted. No diaphoretic. No erythema. No pallor. 3x3 cm mass right abdominal wall just superomedial to mcburney's point, 1.5 x 2 cm mass just below left costal margin, 3 x 4 cm mass right lower back over the paraspinus muscles, 2 x 2 cm mass over the quadriceps tendon.  Psychiatric: Normal mood and affect. Normal behavior. Judgment and thought content normal.   Labs, Imaging and Diagnostic Testing: None recent.     Assessment and Plan:   Diagnoses and all orders for this visit:  Mass on back  Mass of soft tissue of abdomen  History of mechanical aortic valve replacement  Chronic anticoagulation   Will get cardiology to let us know about lovenox bridge and to comment on whether he needs any additional testing for risk stratification.  I discussed excision of these masses. I reviewed that each mass would have an incision over it. This would be closed with dissolvable sutures. I would have him do this in the operating room under general anesthesia since there are multiple masses and he will require repositioning.  I reviewed that the main risk to him would be bleeding given that he is going to need a Lovenox bridge for his mechanical valve. He however would have risk of wound breakdown, infection, chronic pain, heart or lung complications, blood clot. I discussed 2 weeks of no swimming or bath tubs, no shower for 2 days, and no strenuous activity or heavy lifting for 1 to 2 weeks.  We will proceed when we hear from cardiology.

## 2022-06-14 MED ORDER — GENTAMICIN SULFATE 40 MG/ML IJ SOLN
5.0000 mg/kg | INTRAVENOUS | Status: DC
  Filled 2022-06-14: qty 9.5

## 2022-06-14 MED ORDER — CLINDAMYCIN PHOSPHATE 900 MG/50ML IV SOLN
900.0000 mg | INTRAVENOUS | Status: AC
  Administered 2022-06-17: 900 mg via INTRAVENOUS
  Filled 2022-06-14: qty 50

## 2022-06-17 ENCOUNTER — Ambulatory Visit (HOSPITAL_BASED_OUTPATIENT_CLINIC_OR_DEPARTMENT_OTHER): Payer: Medicare Other | Admitting: Certified Registered"

## 2022-06-17 ENCOUNTER — Encounter (HOSPITAL_BASED_OUTPATIENT_CLINIC_OR_DEPARTMENT_OTHER): Payer: Self-pay | Admitting: General Surgery

## 2022-06-17 ENCOUNTER — Ambulatory Visit (HOSPITAL_BASED_OUTPATIENT_CLINIC_OR_DEPARTMENT_OTHER)
Admission: RE | Admit: 2022-06-17 | Discharge: 2022-06-17 | Disposition: A | Discharge status: Home / Self Care | Payer: Medicare Other | Attending: General Surgery | Admitting: General Surgery

## 2022-06-17 ENCOUNTER — Other Ambulatory Visit: Payer: Self-pay

## 2022-06-17 ENCOUNTER — Encounter (HOSPITAL_BASED_OUTPATIENT_CLINIC_OR_DEPARTMENT_OTHER)
Admission: RE | Disposition: A | Discharge status: Home / Self Care | Payer: Self-pay | Source: Home / Self Care | Attending: General Surgery

## 2022-06-17 DIAGNOSIS — D1723 Benign lipomatous neoplasm of skin and subcutaneous tissue of right leg: Secondary | ICD-10-CM | POA: Diagnosis not present

## 2022-06-17 DIAGNOSIS — Z87891 Personal history of nicotine dependence: Secondary | ICD-10-CM

## 2022-06-17 DIAGNOSIS — R222 Localized swelling, mass and lump, trunk: Secondary | ICD-10-CM | POA: Diagnosis not present

## 2022-06-17 DIAGNOSIS — D171 Benign lipomatous neoplasm of skin and subcutaneous tissue of trunk: Secondary | ICD-10-CM | POA: Insufficient documentation

## 2022-06-17 DIAGNOSIS — I7121 Aneurysm of the ascending aorta, without rupture: Secondary | ICD-10-CM | POA: Diagnosis not present

## 2022-06-17 DIAGNOSIS — Z7901 Long term (current) use of anticoagulants: Secondary | ICD-10-CM | POA: Insufficient documentation

## 2022-06-17 DIAGNOSIS — D1724 Benign lipomatous neoplasm of skin and subcutaneous tissue of left leg: Secondary | ICD-10-CM | POA: Insufficient documentation

## 2022-06-17 DIAGNOSIS — M7989 Other specified soft tissue disorders: Secondary | ICD-10-CM | POA: Diagnosis not present

## 2022-06-17 DIAGNOSIS — D1721 Benign lipomatous neoplasm of skin and subcutaneous tissue of right arm: Secondary | ICD-10-CM | POA: Diagnosis not present

## 2022-06-17 DIAGNOSIS — Z952 Presence of prosthetic heart valve: Secondary | ICD-10-CM | POA: Insufficient documentation

## 2022-06-17 DIAGNOSIS — Z01818 Encounter for other preprocedural examination: Secondary | ICD-10-CM

## 2022-06-17 DIAGNOSIS — I509 Heart failure, unspecified: Secondary | ICD-10-CM

## 2022-06-17 DIAGNOSIS — R109 Unspecified abdominal pain: Secondary | ICD-10-CM | POA: Diagnosis not present

## 2022-06-17 HISTORY — PX: EXCISION OF BACK LESION: SHX6597

## 2022-06-17 HISTORY — DX: Atherosclerotic heart disease of native coronary artery without angina pectoris: I25.10

## 2022-06-17 HISTORY — PX: EXCISION MASS LOWER EXTREMETIES: SHX6705

## 2022-06-17 HISTORY — PX: EXCISION MASS UPPER EXTREMETIES: SHX6704

## 2022-06-17 HISTORY — PX: EXCISION MASS ABDOMINAL: SHX6701

## 2022-06-17 HISTORY — DX: Family history of other specified conditions: Z84.89

## 2022-06-17 LAB — PROTIME-INR
INR: 1 (ref 0.8–1.2)
Prothrombin Time: 13.5 seconds (ref 11.4–15.2)

## 2022-06-17 SURGERY — EXCISION, LESION, BACK
Anesthesia: General | Site: Thigh | Laterality: Right

## 2022-06-17 MED ORDER — PROPOFOL 10 MG/ML IV BOLUS
INTRAVENOUS | Status: DC | PRN
  Administered 2022-06-17: 150 mg via INTRAVENOUS

## 2022-06-17 MED ORDER — DEXAMETHASONE SODIUM PHOSPHATE 10 MG/ML IJ SOLN
INTRAMUSCULAR | Status: AC
  Filled 2022-06-17: qty 1

## 2022-06-17 MED ORDER — ACETAMINOPHEN 500 MG PO TABS
1000.0000 mg | ORAL_TABLET | Freq: Once | ORAL | Status: AC
  Administered 2022-06-17: 1000 mg via ORAL

## 2022-06-17 MED ORDER — EPHEDRINE 5 MG/ML INJ
INTRAVENOUS | Status: AC
  Filled 2022-06-17: qty 5

## 2022-06-17 MED ORDER — ONDANSETRON HCL 4 MG/2ML IJ SOLN
INTRAMUSCULAR | Status: AC
  Filled 2022-06-17: qty 2

## 2022-06-17 MED ORDER — FENTANYL CITRATE (PF) 100 MCG/2ML IJ SOLN
INTRAMUSCULAR | Status: DC | PRN
  Administered 2022-06-17 (×2): 50 ug via INTRAVENOUS

## 2022-06-17 MED ORDER — OXYCODONE HCL 5 MG PO TABS: 5.0000 mg | ORAL_TABLET | Freq: Once | ORAL | Status: DC | PRN

## 2022-06-17 MED ORDER — MIDAZOLAM HCL 5 MG/5ML IJ SOLN
INTRAMUSCULAR | Status: DC | PRN
  Administered 2022-06-17: 2 mg via INTRAVENOUS

## 2022-06-17 MED ORDER — FENTANYL CITRATE (PF) 100 MCG/2ML IJ SOLN
INTRAMUSCULAR | Status: AC
  Filled 2022-06-17: qty 2

## 2022-06-17 MED ORDER — CHLORHEXIDINE GLUCONATE CLOTH 2 % EX PADS: 6.0000 | MEDICATED_PAD | Freq: Once | CUTANEOUS | Status: DC

## 2022-06-17 MED ORDER — DEXAMETHASONE SODIUM PHOSPHATE 10 MG/ML IJ SOLN
INTRAMUSCULAR | Status: DC | PRN
  Administered 2022-06-17: 5 mg via INTRAVENOUS

## 2022-06-17 MED ORDER — LIDOCAINE-EPINEPHRINE (PF) 1 %-1:200000 IJ SOLN
INTRAMUSCULAR | Status: DC | PRN
  Administered 2022-06-17: 28 mL via SUBCUTANEOUS

## 2022-06-17 MED ORDER — OXYCODONE HCL 5 MG/5ML PO SOLN: 5.0000 mg | Freq: Once | ORAL | Status: DC | PRN

## 2022-06-17 MED ORDER — ROCURONIUM BROMIDE 100 MG/10ML IV SOLN
INTRAVENOUS | Status: DC | PRN
  Administered 2022-06-17: 100 mg via INTRAVENOUS

## 2022-06-17 MED ORDER — HYDROMORPHONE HCL 1 MG/ML IJ SOLN: 0.2500 mg | INTRAMUSCULAR | Status: DC | PRN

## 2022-06-17 MED ORDER — ACETAMINOPHEN 500 MG PO TABS: 1000.0000 mg | ORAL_TABLET | ORAL | Status: AC

## 2022-06-17 MED ORDER — LIDOCAINE 2% (20 MG/ML) 5 ML SYRINGE
INTRAMUSCULAR | Status: DC | PRN
  Administered 2022-06-17: 60 mg via INTRAVENOUS

## 2022-06-17 MED ORDER — ONDANSETRON HCL 4 MG/2ML IJ SOLN
INTRAMUSCULAR | Status: DC | PRN
  Administered 2022-06-17: 4 mg via INTRAVENOUS

## 2022-06-17 MED ORDER — ONDANSETRON HCL 4 MG/2ML IJ SOLN: 4.0000 mg | Freq: Once | INTRAMUSCULAR | Status: DC | PRN

## 2022-06-17 MED ORDER — OXYCODONE HCL 5 MG PO TABS
5.0000 mg | ORAL_TABLET | Freq: Four times a day (QID) | ORAL | 0 refills | Status: DC | PRN
Start: 2022-06-17 — End: 2022-08-22

## 2022-06-17 MED ORDER — 0.9 % SODIUM CHLORIDE (POUR BTL) OPTIME
TOPICAL | Status: DC | PRN
  Administered 2022-06-17: 200 mL

## 2022-06-17 MED ORDER — LACTATED RINGERS IV SOLN: INTRAVENOUS | Status: DC

## 2022-06-17 MED ORDER — LIDOCAINE 2% (20 MG/ML) 5 ML SYRINGE
INTRAMUSCULAR | Status: AC
  Filled 2022-06-17: qty 5

## 2022-06-17 MED ORDER — AMISULPRIDE (ANTIEMETIC) 5 MG/2ML IV SOLN: 10.0000 mg | Freq: Once | INTRAVENOUS | Status: DC | PRN

## 2022-06-17 MED ORDER — PROPOFOL 10 MG/ML IV BOLUS
INTRAVENOUS | Status: AC
  Filled 2022-06-17: qty 20

## 2022-06-17 MED ORDER — SUGAMMADEX SODIUM 200 MG/2ML IV SOLN
INTRAVENOUS | Status: DC | PRN
  Administered 2022-06-17: 400 mg via INTRAVENOUS

## 2022-06-17 MED ORDER — MIDAZOLAM HCL 2 MG/2ML IJ SOLN
INTRAMUSCULAR | Status: AC
  Filled 2022-06-17: qty 2

## 2022-06-17 MED ORDER — EPHEDRINE SULFATE (PRESSORS) 50 MG/ML IJ SOLN
INTRAMUSCULAR | Status: DC | PRN
  Administered 2022-06-17 (×3): 5 mg via INTRAVENOUS

## 2022-06-17 MED ORDER — ACETAMINOPHEN 500 MG PO TABS
ORAL_TABLET | ORAL | Status: AC
  Filled 2022-06-17: qty 2

## 2022-06-17 SURGICAL SUPPLY — 49 items
ADH SKN CLS APL DERMABOND .7 (GAUZE/BANDAGES/DRESSINGS) ×12
APL PRP STRL LF DISP 70% ISPRP (MISCELLANEOUS) ×12
BLADE HEX COATED 2.75 (ELECTRODE) IMPLANT
BLADE SURG 10 STRL SS (BLADE) ×4 IMPLANT
BLADE SURG 15 STRL LF DISP TIS (BLADE) ×4 IMPLANT
BLADE SURG 15 STRL SS (BLADE) ×8
CANISTER SUCT 1200ML W/VALVE (MISCELLANEOUS) IMPLANT
CHLORAPREP W/TINT 26 (MISCELLANEOUS) ×4 IMPLANT
COVER BACK TABLE 60X90IN (DRAPES) IMPLANT
COVER MAYO STAND STRL (DRAPES) IMPLANT
DERMABOND ADVANCED .7 DNX12 (GAUZE/BANDAGES/DRESSINGS) ×4 IMPLANT
DRAPE LAPAROSCOPIC ABDOMINAL (DRAPES) IMPLANT
DRAPE LAPAROTOMY 100X72 PEDS (DRAPES) IMPLANT
DRAPE UTILITY XL STRL (DRAPES) ×4 IMPLANT
ELECT REM PT RETURN 9FT ADLT (ELECTROSURGICAL) ×4
ELECTRODE REM PT RTRN 9FT ADLT (ELECTROSURGICAL) ×4 IMPLANT
GAUZE PAD ABD 8X10 STRL (GAUZE/BANDAGES/DRESSINGS) IMPLANT
GAUZE SPONGE 4X4 12PLY STRL LF (GAUZE/BANDAGES/DRESSINGS) ×4 IMPLANT
GLOVE BIO SURGEON STRL SZ 6 (GLOVE) ×4 IMPLANT
GLOVE BIOGEL PI IND STRL 6.5 (GLOVE) ×4 IMPLANT
GLOVE BIOGEL PI IND STRL 7.0 (GLOVE) IMPLANT
GLOVE BIOGEL PI MICRO STRL 5.5 (GLOVE) IMPLANT
GLOVE SURG SS PI 7.0 STRL IVOR (GLOVE) IMPLANT
GOWN STRL REUS W/ TWL LRG LVL3 (GOWN DISPOSABLE) ×4 IMPLANT
GOWN STRL REUS W/TWL 2XL LVL3 (GOWN DISPOSABLE) ×4 IMPLANT
GOWN STRL REUS W/TWL LRG LVL3 (GOWN DISPOSABLE) ×8
NDL HYPO 25X1 1.5 SAFETY (NEEDLE) ×4 IMPLANT
NEEDLE HYPO 25X1 1.5 SAFETY (NEEDLE) ×4 IMPLANT
NS IRRIG 1000ML POUR BTL (IV SOLUTION) IMPLANT
PACK BASIN DAY SURGERY FS (CUSTOM PROCEDURE TRAY) ×4 IMPLANT
PACK UNIVERSAL I (CUSTOM PROCEDURE TRAY) IMPLANT
PENCIL SMOKE EVACUATOR (MISCELLANEOUS) ×4 IMPLANT
SLEEVE SCD COMPRESS KNEE MED (STOCKING) IMPLANT
SPIKE FLUID TRANSFER (MISCELLANEOUS) IMPLANT
SPONGE T-LAP 18X18 ~~LOC~~+RFID (SPONGE) ×4 IMPLANT
STAPLER VISISTAT 35W (STAPLE) IMPLANT
STRIP CLOSURE SKIN 1/2X4 (GAUZE/BANDAGES/DRESSINGS) ×4 IMPLANT
SUT MNCRL AB 4-0 PS2 18 (SUTURE) ×4 IMPLANT
SUT SILK 3 0 TIES 17X18 (SUTURE)
SUT SILK 3-0 18XBRD TIE BLK (SUTURE) IMPLANT
SUT VIC AB 2-0 SH 27 (SUTURE)
SUT VIC AB 2-0 SH 27XBRD (SUTURE) IMPLANT
SUT VIC AB 3-0 SH 27 (SUTURE) ×12
SUT VIC AB 3-0 SH 27X BRD (SUTURE) ×4 IMPLANT
SYR BULB EAR ULCER 3OZ GRN STR (SYRINGE) ×4 IMPLANT
SYR CONTROL 10ML LL (SYRINGE) ×4 IMPLANT
TOWEL GREEN STERILE FF (TOWEL DISPOSABLE) ×4 IMPLANT
TUBE CONNECTING 20X1/4 (TUBING) IMPLANT
YANKAUER SUCT BULB TIP NO VENT (SUCTIONS) IMPLANT

## 2022-06-17 NOTE — Interval H&P Note (Signed)
History and Physical Interval Note:  06/17/2022 12:30 PM  Ryan Lamb  has presented today for surgery, with the diagnosis of RIGHT ABDOMINAL MASS, RIGHT LOWER BACK MASS.  The various methods of treatment have been discussed with the patient and family. After consideration of risks, benefits and other options for treatment, the patient has consented to  Procedure(s): EXCISION OF RIGHT ABDOMINAL WALL MASS, EXCISION OF LEFT ABDOMINAL WALL MASS (Bilateral) EXCISION OF RIGHT LOWER BACK MASS, EXCISION OF RIGHT ANTERIOR THIGH MASS (Right) as a surgical intervention.  The patient's history has been reviewed, patient examined, no change in status, stable for surgery.  I have reviewed the patient's chart and labs.  Questions were answered to the patient's satisfaction.     Almond Lint

## 2022-06-17 NOTE — Op Note (Addendum)
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, back and extremities; mechanical aortic valve replacement on anticoagulation; thoracic aortic aneurysm  Post-operative Diagnosis: same  Surgeon: Almond Lint, MD - Primary Josepha Pigg, MD - Assistant  Procedure:   Excision of lipomas of left upper and right lower abdomen, right anterior thigh, right lower back, and right anterior forearm  Anesthesia: general  Indication: Ryan Lamb is a 67 year-old male with history of mechanical aortic valve replacement (on Coumadin, now held), who presented with symptomatic soft tissue masses, thought to be lipomas, of his abdomen, back, and extremities as listed above. They have not obviously increased in size. Given they have been symptomatic and bothersome, he sought surgical removal and agrees to proceed given benefits/risks of the procedure, notably bleeding due to chronic anticoagulation use.  Description of Procedure: General anesthesia was induced without complication, and the patient was first positioned in left lateral decubitus position on the operating table. The correct laterality and operative site were confirmed, and the patient was prepped and draped in routine sterile fashion. A time-out was performed.  A transverse incision overlying the right lower back subcutaneous mass was made, and dissection was carried down and circumferentially around the mass using blunt technique and electrocautery. The mass was excised whole and measured 3 x 4 cm, and was sent as permanent specimen for pathological analysis. Excellent hemostasis was achieved. The wound was closed in layers, with interrupted 3-0 Vicryl sutures for the dermis and running 4-0 Monocryl for the skin, covered with Dermabond.  The patient was then repositioned supine, and abdomen and right thigh were prepped and draped in routine sterile fashion. Similar  to the above, transverse incisions overlying the right thigh, right lower abdominal, and left subcostal masses were made. In similar technique, the masses were removed sequentially, and measured 2 x 2 cm (right thigh), 3 x 3 cm (right lower abdomen), and 1.5 x 2 cm (left upper abdomen). These were sent to Pathology as well. After hemostasis was achieved, the wounds were closed and dressed in the same fashion as above.  Finally, the right forearm was prepped and draped in routine sterile fashion. The mass was removed in the same way as above, and measured 1 x 2 cm and sent to Pathology. After hemostasis was achieved, the incision was closed and dressed per above.  All instrument, sponge, and needle counts were correct. There were no immediate postoperative complications. The patient was extubated and transported in hemodynamically stable condition to the PACU.   Norberta Keens, MD  PGY-3, General Surgery   Attending Addendum:  Dr. Donell Beers was present for all key portions of the above procedure and immediately available via phone/pager throughout, and agrees with the report as written above.

## 2022-06-17 NOTE — Anesthesia Procedure Notes (Signed)
Procedure Name: Intubation Date/Time: 06/17/2022 1:24 PM  Performed by: Lauralyn Primes, CRNAPre-anesthesia Checklist: Patient identified, Emergency Drugs available, Suction available and Patient being monitored Patient Re-evaluated:Patient Re-evaluated prior to induction Oxygen Delivery Method: Circle system utilized Preoxygenation: Pre-oxygenation with 100% oxygen Induction Type: IV induction Ventilation: Mask ventilation without difficulty Laryngoscope Size: Mac and 3 Grade View: Grade I Tube type: Oral Tube size: 7.5 mm Number of attempts: 1 Airway Equipment and Method: Stylet and Bite block Placement Confirmation: ETT inserted through vocal cords under direct vision, positive ETCO2 and breath sounds checked- equal and bilateral Secured at: 23 cm Tube secured with: Tape Dental Injury: Teeth and Oropharynx as per pre-operative assessment

## 2022-06-17 NOTE — Transfer of Care (Signed)
Immediate Anesthesia Transfer of Care Note  Patient: Ryan Lamb  Procedure(s) Performed: EXCISION OF RIGHT LOWER BACK MASS (Right: Back) EXCISION OF RIGHT ANTERIOR THIGH MASS (Right: Thigh) EXCISION MASS LEFT UPPER ABDOMINAL WALL AND EXCISION MASS RIGHT LOWER ABDOMINAL WALL (Bilateral: Abdomen) EXCISION MASS RIGHT LOWER ARM (Right: Arm Lower)  Patient Location: PACU  Anesthesia Type:General  Level of Consciousness: drowsy  Airway & Oxygen Therapy: Patient Spontanous Breathing and Patient connected to face mask oxygen  Post-op Assessment: Report given to RN and Post -op Vital signs reviewed and stable  Post vital signs: Reviewed and stable  Last Vitals:  Vitals Value Taken Time  BP 119/82 06/17/22 1437  Temp    Pulse 86 06/17/22 1439  Resp 9 06/17/22 1439  SpO2 99 % 06/17/22 1439  Vitals shown include unvalidated device data.  Last Pain:  Vitals:   06/17/22 1017  TempSrc: Oral  PainSc: 0-No pain         Complications: No notable events documented.

## 2022-06-17 NOTE — Discharge Instructions (Addendum)
[facility] Office Phone Number [phone]   POST OP INSTRUCTIONS  Always review your discharge instruction sheet given to you by the facility where your surgery was performed.  IF YOU HAVE DISABILITY OR FAMILY LEAVE FORMS, YOU MUST BRING THEM TO THE OFFICE FOR PROCESSING.  DO NOT GIVE THEM TO YOUR DOCTOR.  Take 2 tylenol (acetominophen) three times a day for 3 days.  If you still have pain, add ibuprofen with food in between if able to take this (if you have kidney issues or stomach issues, do not take ibuprofen).  If both of those are not enough, add the narcotic pain pill.  If you find you are needing a lot of this overnight after surgery, call the next morning for a refill.   Take your usually prescribed medications unless otherwise directed If you need a refill on your pain medication, please contact your pharmacy.  They will contact our office to request authorization.  Prescriptions will not be filled after 5pm or on week-ends. You should eat very light the first 24 hours after surgery, such as soup, crackers, pudding, etc.  Resume your normal diet the day after surgery It is common to experience some constipation if taking pain medication after surgery.  Increasing fluid intake and taking a stool softener will usually help or prevent this problem from occurring.  A mild laxative (Milk of Magnesia or Miralax) should be taken according to package directions if there are no bowel movements after 48 hours. You may shower in 48 hours.  The surgical glue will flake off in 2-3 weeks.   ACTIVITIES:  No strenuous activity or heavy lifting for 1 week.   You may drive when you no longer are taking prescription pain medication, you can comfortably wear a seatbelt, and you can safely maneuver your car and apply brakes. RETURN TO WORK:  __________1 week or as tolerated if no heavy lifting or strenuous activity x 1 week.  _________ Ryan Lamb should see your doctor in the office for a follow-up  appointment approximately three-four weeks after your surgery.    WHEN TO CALL YOUR DOCTOR: Fever over 101.0 Nausea and/or vomiting. Extreme swelling or bruising. Continued bleeding from incision. Increased pain, redness, or drainage from the incision.  The clinic staff is available to answer your questions during regular business hours.  Please don't hesitate to call and ask to speak to one of the nurses for clinical concerns.  If you have a medical emergency, go to the nearest emergency room or call 911.  A surgeon from [facility] is always on call at the hospital.  For further questions, please visit [email or web address]     Post Anesthesia Home Care Instructions  Activity: Get plenty of rest for the remainder of the day. A responsible individual must stay with you for 24 hours following the procedure.  For the next 24 hours, DO NOT: -Drive a car -Advertising copywriter -Drink alcoholic beverages -Take any medication unless instructed by your physician -Make any legal decisions or sign important papers.  Meals: Start with liquid foods such as gelatin or soup. Progress to regular foods as tolerated. Avoid greasy, spicy, heavy foods. If nausea and/or vomiting occur, drink only clear liquids until the nausea and/or vomiting subsides. Call your physician if vomiting continues.  Special Instructions/Symptoms: Your throat may feel dry or sore from the anesthesia or the breathing tube placed in your throat during surgery. If this causes discomfort, gargle with warm salt water. The discomfort should disappear within  24 hours.  If you had a scopolamine patch placed behind your ear for the management of post- operative nausea and/or vomiting:  1. The medication in the patch is effective for 72 hours, after which it should be removed.  Wrap patch in a tissue and discard in the trash. Wash hands thoroughly with soap and water. 2. You may remove the patch earlier than 72 hours  if you experience unpleasant side effects which may include dry mouth, dizziness or visual disturbances. 3. Avoid touching the patch. Wash your hands with soap and water after contact with the patch.     Next dose of Tylenol may be given at 4:20pm if needed.

## 2022-06-17 NOTE — Anesthesia Preprocedure Evaluation (Addendum)
Anesthesia Evaluation  Patient identified by MRN, date of birth, ID band Patient awake    Reviewed: Allergy & Precautions, NPO status , Patient's Chart, lab work & pertinent test results  Airway Mallampati: I  TM Distance: >3 FB Neck ROM: Full    Dental  (+) Teeth Intact, Dental Advisory Given   Pulmonary former smoker   Pulmonary exam normal breath sounds clear to auscultation       Cardiovascular +CHF (grade 2 diastolic dysfunction,. normal LVEF)  Normal cardiovascular exam+ Valvular Problems/Murmurs (s/p AVR on coumadin- held x 5d, INR 1.0 this AM)  Rhythm:Regular Rate:Normal   h/o aortic valvuloplasty in 1971, mechanical AVR in 1994 for for severe AS, ascending aortic aneurysm  Last echo 2023  1. Left ventricular ejection fraction, by estimation, is 60 to 65%. The  left ventricle has normal function. The left ventricle has no regional  wall motion abnormalities. There is moderate left ventricular hypertrophy  of the basal-septal segment. Left  ventricular diastolic parameters are consistent with Grade II diastolic  dysfunction (pseudonormalization). Elevated left atrial pressure.   2. Right ventricular systolic function is normal. The right ventricular  size is normal.   3. The mitral valve is normal in structure. Trivial mitral valve  regurgitation. No evidence of mitral stenosis.   4. The aortic valve has been repaired/replaced. Aortic valve  regurgitation is trivial. No aortic stenosis is present. There is a 23 mm  St. Jude mechanical valve present in the aortic position. Echo findings  are consistent with normal structure and  function of the aortic valve prosthesis.   5. Aortic dilatation noted. There is severe dilatation of the ascending  aorta, measuring 49 mm.   6. The inferior vena cava is normal in size with greater than 50%  respiratory variability, suggesting right atrial pressure of 3 mmHg.      Neuro/Psych negative neurological ROS  negative psych ROS   GI/Hepatic Neg liver ROS, PUD (2/2 NSAIDs),GERD  Controlled and Medicated,,  Endo/Other  negative endocrine ROS    Renal/GU negative Renal ROS  negative genitourinary   Musculoskeletal negative musculoskeletal ROS (+)    Abdominal   Peds  Hematology negative hematology ROS (+)   Anesthesia Other Findings   Reproductive/Obstetrics negative OB ROS                              Anesthesia Physical Anesthesia Plan  ASA: 3  Anesthesia Plan: General   Post-op Pain Management: Tylenol PO (pre-op)*   Induction: Intravenous  PONV Risk Score and Plan: 2 and Ondansetron, Dexamethasone, Midazolam and Treatment may vary due to age or medical condition  Airway Management Planned: Oral ETT  Additional Equipment: None  Intra-op Plan:   Post-operative Plan: Extubation in OR  Informed Consent: I have reviewed the patients History and Physical, chart, labs and discussed the procedure including the risks, benefits and alternatives for the proposed anesthesia with the patient or authorized representative who has indicated his/her understanding and acceptance.     Dental advisory given  Plan Discussed with: CRNA  Anesthesia Plan Comments:          Anesthesia Quick Evaluation

## 2022-06-17 NOTE — Anesthesia Postprocedure Evaluation (Signed)
Anesthesia Post Note  Patient: Emmette Katt Kendrick III  Procedure(s) Performed: EXCISION OF RIGHT LOWER BACK MASS (Right: Back) EXCISION OF RIGHT ANTERIOR THIGH MASS (Right: Thigh) EXCISION MASS LEFT UPPER ABDOMINAL WALL AND EXCISION MASS RIGHT LOWER ABDOMINAL WALL (Bilateral: Abdomen) EXCISION MASS RIGHT LOWER ARM (Right: Arm Lower)     Patient location during evaluation: PACU Anesthesia Type: General Level of consciousness: awake and alert, oriented and patient cooperative Pain management: pain level controlled Vital Signs Assessment: post-procedure vital signs reviewed and stable Respiratory status: spontaneous breathing, nonlabored ventilation and respiratory function stable Cardiovascular status: blood pressure returned to baseline and stable Postop Assessment: no apparent nausea or vomiting Anesthetic complications: no   No notable events documented.  Last Vitals:  Vitals:   06/17/22 1445 06/17/22 1500  BP: 120/67 120/81  Pulse: 79 81  Resp: 15 16  Temp:    SpO2: 100% 97%    Last Pain:  Vitals:   06/17/22 1500  TempSrc:   PainSc: 0-No pain        RLE Motor Response: Purposeful movement;Responds to commands (06/17/22 1500) RLE Sensation: Full sensation (06/17/22 1500)      Lannie Fields

## 2022-06-18 ENCOUNTER — Encounter (HOSPITAL_BASED_OUTPATIENT_CLINIC_OR_DEPARTMENT_OTHER): Payer: Self-pay | Admitting: General Surgery

## 2022-06-18 LAB — SURGICAL PATHOLOGY

## 2022-06-25 ENCOUNTER — Ambulatory Visit: Payer: Medicare Other

## 2022-06-27 ENCOUNTER — Encounter: Payer: Medicare Other | Admitting: Family Medicine

## 2022-07-04 ENCOUNTER — Ambulatory Visit: Payer: Medicare Other | Attending: Cardiology | Admitting: *Deleted

## 2022-07-04 DIAGNOSIS — Z5181 Encounter for therapeutic drug level monitoring: Secondary | ICD-10-CM | POA: Diagnosis not present

## 2022-07-04 DIAGNOSIS — Z952 Presence of prosthetic heart valve: Secondary | ICD-10-CM | POA: Diagnosis not present

## 2022-07-04 LAB — POCT INR: INR: 1.8 — AB (ref 2.0–3.0)

## 2022-07-04 NOTE — Patient Instructions (Signed)
Description   Take 1 tablet of warfarin today, then start taking Warfarin 1/2 tablet daily except 1 tablet on Mondays, Wednesdays, and Fridays. Recheck INR in 3 weeks.  Call us with any medications Changes or concerns # [phone] or [phone]

## 2022-07-25 ENCOUNTER — Ambulatory Visit: Payer: Medicare Other | Attending: Cardiology | Admitting: *Deleted

## 2022-07-25 DIAGNOSIS — Z5181 Encounter for therapeutic drug level monitoring: Secondary | ICD-10-CM | POA: Diagnosis not present

## 2022-07-25 DIAGNOSIS — Z952 Presence of prosthetic heart valve: Secondary | ICD-10-CM | POA: Diagnosis not present

## 2022-07-25 LAB — POCT INR: INR: 3.6 — AB (ref 2.0–3.0)

## 2022-07-25 NOTE — Patient Instructions (Signed)
Description   Tomorrow take 1/2 tablet then continue taking Warfarin 1/2 tablet daily except 1 tablet on Mondays, Wednesdays, and Fridays. Recheck INR in 4 weeks.  Call us with any medications Changes or concerns # [phone] or [phone]

## 2022-08-01 ENCOUNTER — Encounter: Payer: Self-pay | Admitting: Cardiology

## 2022-08-22 ENCOUNTER — Ambulatory Visit: Payer: Medicare Other | Attending: Cardiology | Admitting: *Deleted

## 2022-08-22 ENCOUNTER — Encounter: Payer: Self-pay | Admitting: Family Medicine

## 2022-08-22 ENCOUNTER — Ambulatory Visit (INDEPENDENT_AMBULATORY_CARE_PROVIDER_SITE_OTHER): Payer: Medicare Other | Admitting: Family Medicine

## 2022-08-22 VITALS — BP 120/78 | HR 71 | Temp 98.2°F | Ht 69.0 in | Wt 169.2 lb

## 2022-08-22 DIAGNOSIS — Z0000 Encounter for general adult medical examination without abnormal findings: Secondary | ICD-10-CM | POA: Diagnosis not present

## 2022-08-22 DIAGNOSIS — I700 Atherosclerosis of aorta: Secondary | ICD-10-CM

## 2022-08-22 DIAGNOSIS — Z952 Presence of prosthetic heart valve: Secondary | ICD-10-CM

## 2022-08-22 DIAGNOSIS — Z5181 Encounter for therapeutic drug level monitoring: Secondary | ICD-10-CM

## 2022-08-22 DIAGNOSIS — I7121 Aneurysm of the ascending aorta, without rupture: Secondary | ICD-10-CM | POA: Diagnosis not present

## 2022-08-22 DIAGNOSIS — E7800 Pure hypercholesterolemia, unspecified: Secondary | ICD-10-CM

## 2022-08-22 DIAGNOSIS — D689 Coagulation defect, unspecified: Secondary | ICD-10-CM

## 2022-08-22 LAB — POCT INR: INR: 3.4 — AB (ref 2.0–3.0)

## 2022-08-22 MED ORDER — PANTOPRAZOLE SODIUM 20 MG PO TBEC: 20.0000 mg | DELAYED_RELEASE_TABLET | Freq: Every day | ORAL | 5 refills | Status: DC | PRN

## 2022-08-22 NOTE — Patient Instructions (Signed)
Description   Continue taking Warfarin 1/2 tablet daily except 1 tablet on Mondays, Wednesdays, and Fridays. Recheck INR in 5 weeks.  Call us with any medications Changes or concerns # [phone] or [phone]

## 2022-08-22 NOTE — Progress Notes (Addendum)
Phone: [phone]   Subjective:  Patient presents today for their Welcome to Medicare Exam    Preventive Screening-Counseling & Management  Vision screen: normal Vision Screening   Right eye Left eye Both eyes  Without correction 20/25 20/20 20/20   With correction      Advanced directives: Full code- no HCPOA  Modifiable Risk Factors/behavioral risk assessment/psychosocial risk assessment Regular exercise: walking 4-5 days a week 35-40 minutes, some strength training- had to pull back with lipoma removals earlier this year Diet: weight up a few lbs after losing father earlier- trying to improve diet to turn this back around- diet rich in fruits/vegetables and low in processed food  Wt Readings from Last 3 Encounters:  08/22/22 169 lb 3.2 oz (76.7 kg)  06/17/22 167 lb 8.8 oz (76 kg)  05/13/22 168 lb (76.2 kg)   Smoking Status: former Smoker but quit well over 20 years ago- abdominal aortic aneurysm was negative Second Hand Smoking status: No smokers in home Alcohol intake: does not drink Other substance abuse/illicit drugs: no drugs  Cardiac risk factors:  advanced age (older than 39 for men, 46 for women)  treated Hyperlipidemia  no Hypertension  No diabetes.  Family History: dad and mom with coronary artery disease in 36s  Family History  Problem Relation Age of Onset   Coronary artery disease Mother        CABG. 81 in 07-Sep-2017.    Coronary artery disease Father        aortic valve replacement. died at 71 in 09/08/2022   Skin cancer Father         24 in September 07, 2017.    Healthy Sister    Schizophrenia Sister        home health care   Healthy Brother        overweight   GI problems Neg Hx     Depression Screen/risk evaluation Risk factors: loss of father. PHQ9 of 0     08/22/2022    3:19 PM 03/13/2022   11:22 AM 12/26/2021    8:18 AM 12/25/2020    8:50 AM 12/23/2019    8:14 AM  Depression screen PHQ 2/9  Decreased Interest 0 0 0 0 0  Down, Depressed, Hopeless 0 0 0 0 0   PHQ - 2 Score 0 0 0 0 0  Altered sleeping 0 0     Tired, decreased energy 0 0     Change in appetite 0 0     Feeling bad or failure about yourself  0 0     Trouble concentrating 0 0     Moving slowly or fidgety/restless 0 0     Suicidal thoughts 0 0     PHQ-9 Score 0 0     Difficult doing work/chores  Not difficult at all       Functional ability and level of safety Mobility assessment:  timed get up and go <12 seconds Activities of Daily Living- Independent in ADLs (toileting, bathing, dressing, transferring, eating) and in IADLs (shopping, housekeeping, managing own medications, and handling finances) Home Safety: Loose rugs (no), smoke detectors (up to date ), small pets (none), grab bars (in place in one shower in home- not in his but not at point where he needs it), stairs (single story- front porch few steps no issues), life-alert system (would use cell phone) Hearing Difficulties: patient endorses issues- no recent test- aids been recommended in past- he declines- consider repeat Fall Risk: None     03/13/2022  11:22 AM 12/26/2021    8:18 AM 12/17/2018    8:38 AM 02/26/2018    2:07 PM 12/15/2017    8:41 AM  Fall Risk   Falls in the past year? 0 0 0 0 0  Number falls in past yr: 0 0 0    Injury with Fall? 0 0 0    Risk for fall due to : No Fall Risks No Fall Risks     Follow up Falls evaluation completed Falls evaluation completed      Opioid use history:  no long term opioids use Self assessment of health status: excellent  Required Immunizations needed today:  opts out of COVID, plans on flu later Immunization History  Administered Date(s) Administered   Fluad Quad(high Dose 65+) 12/26/2021   Influenza Inj Mdck Quad Pf 09/17/2018   Influenza,inj,Quad PF,6+ Mos 11/21/2014, 08/10/2015, 12/12/2016, 10/04/2017, 10/25/2020   Moderna Sars-Covid-2 Vaccination 05/12/2019, 06/09/2019   PNEUMOCOCCAL CONJUGATE-20 12/26/2021   Tdap 11/21/2014   Zoster Recombinant(Shingrix)  12/15/2017, 03/18/2018   Health Maintenance  Topic Date Due   COVID-19 Vaccine (3 - Moderna risk series) 09/07/2022*   Flu Shot  04/07/2023*   Medicare Annual Wellness Visit  08/22/2023   DTaP/Tdap/Td vaccine (2 - Td or Tdap) 11/20/2024   Colon Cancer Screening  08/15/2025   Pneumonia Vaccine  Completed   Hepatitis C Screening  Completed   HIV Screening  Completed   Zoster (Shingles) Vaccine  Completed   HPV Vaccine  Aged Out  *Topic was postponed. The date shown is not the original due date.   Screening tests-  Colon cancer screening- 08/16/15 with 10 year repeat- last polyp hyperplastic  Lung Cancer screening- quit over 20 years ago-not a candidate for lung cancer screening Skin cancer screening- has been seen by dermatology in the past- saw this year as well- was told 2 year repeat Prostate cancer screening-low risk prior PSA trend-offered update - will do with next labs  Lab Results  Component Value Date   PSA 1.62 12/26/2021   PSA 1.79 12/25/2020   PSA 1.25 12/23/2019   The following were reviewed and entered/updated in epic: Past Medical History:  Diagnosis Date   Aortic stenosis    Aortic valve prosthesis present    Coronary artery disease    Dissecting aortic aneurysm (any part), thoracic (HCC)    Diverticulosis    Family history of adverse reaction to anesthesia    mother had complications "years ago"   Gastric ulcer    due to nsaids in past   Hypercholesterolemia    Mechanical heart valve present    Patient Active Problem List   Diagnosis Date Noted   Ascending aortic aneurysm (HCC) 10/18/2010    Priority: High   Hx of mechanical aortic valve replacement     Priority: High   Aortic atherosclerosis (HCC) 12/25/2020    Priority: Medium    History of adenomatous polyp of colon 12/11/2015    Priority: Medium    GERD (gastroesophageal reflux disease) 04/22/2011    Priority: Medium    Hypercholesterolemia     Priority: Medium    Acquired coagulation disorder  (HCC) 12/23/2019    Priority: Low   Encounter for therapeutic drug monitoring 12/08/2015    Priority: Low   Long term (current) use of anticoagulants 05/08/2010    Priority: Low   Pulmonary nodule, left 05/13/2022   Aortopathy associated with bicuspid aortic valve 04/24/2022   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/19/2021   Past Surgical  History:  Procedure Laterality Date   CARDIAC VALVE REPLACEMENT     EEC     with propofol    ESOPHAGOGASTRODUODENOSCOPY  01/2010, 04/2010   ESOPHAGOGASTRODUODENOSCOPY     cauterize after nsaid related ulcer   EXCISION MASS ABDOMINAL Bilateral 06/17/2022   Procedure: EXCISION MASS LEFT UPPER ABDOMINAL WALL AND EXCISION MASS RIGHT LOWER ABDOMINAL WALL;  Surgeon: Almond Lint, MD;  Location: [facility];  Service: General;  Laterality: Bilateral;   EXCISION MASS LOWER EXTREMETIES Right 06/17/2022   Procedure: EXCISION OF RIGHT ANTERIOR THIGH MASS;  Surgeon: Almond Lint, MD;  Location: [facility];  Service: General;  Laterality: Right;   EXCISION MASS UPPER EXTREMETIES Right 06/17/2022   Procedure: EXCISION MASS RIGHT LOWER ARM;  Surgeon: Almond Lint, MD;  Location: [facility];  Service: General;  Laterality: Right;   EXCISION OF BACK LESION Right 06/17/2022   Procedure: EXCISION OF RIGHT LOWER BACK MASS;  Surgeon: Almond Lint, MD;  Location: [facility];  Service: General;  Laterality: Right;   EXPLORATION POST OPERATIVE OPEN HEART  09/02/92, 1972    Family History  Problem Relation Age of Onset   Coronary artery disease Mother        CABG. 81 in 2017-09-02.    Coronary artery disease Father        aortic valve replacement. died at 15 in 2022/09/03   Skin cancer Father         86 in September 02, 2017.    Healthy Sister    Schizophrenia Sister        home health care   Healthy Brother        overweight   GI problems Neg Hx     Medications- reviewed and updated Current Outpatient Medications   Medication Sig Dispense Refill   fluticasone (FLONASE) 50 MCG/ACT nasal spray Place 2 sprays into both nostrils daily. 16 g 6   loratadine (CLARITIN) 10 MG tablet Take 1 tablet (10 mg total) by mouth daily. 30 tablet 11   MULTIPLE VITAMIN PO Take 1 tablet by mouth daily.      rosuvastatin (CRESTOR) 20 MG tablet TAKE 1 TABLET(20 MG) BY MOUTH DAILY 90 tablet 4   Saline (SIMPLY SALINE) 0.9 % AERS Place 2 each into the nose daily as needed. 500 mL 1   warfarin (COUMADIN) 5 MG tablet TALE 1/2 TO 1 TABLET BY MOUTH DAILY AS DIRECTED BY COUMADIN CLINIC 70 tablet 1   pantoprazole (PROTONIX) 20 MG tablet Take 1 tablet (20 mg total) by mouth daily as needed for heartburn or indigestion. 30 tablet 5   No current facility-administered medications for this visit.    Allergies-reviewed and updated Allergies  Allergen Reactions   Quinolones     Patient was warned about not using Cipro and similar antibiotics. Recent studies have raised concern that fluoroquinolone antibiotics could be associated with an increased risk of aortic aneurysm Fluoroquinolones have non-antimicrobial properties that might jeopardise the integrity of the extracellular matrix of the vascular wall In a  propensity score matched cohort study in [place], there was a 66% increased rate of aortic aneurysm or dissection associated with oral fluoroquinolone use, compared wit   Penicillins Rash    Reaction unknown may have been a rash    Social History   Socioeconomic History   Marital status: Single    Spouse name: Not on file   Number of children: Not on file   Years of education: Not on file  Highest education level: Not on file  Occupational History   Not on file  Tobacco Use   Smoking status: Former    Current packs/day: 0.00    Average packs/day: 0.5 packs/day for 5.0 years (2.5 ttl pk-yrs)    Types: Cigarettes    Start date: 10/17/1972    Quit date: 10/17/1977    Years since quitting: 44.8   Smokeless tobacco: Never   Substance and Sexual Activity   Alcohol use: No   Drug use: No   Sexual activity: Not on file  Other Topics Concern   Not on file  Social History Narrative   Single. Lives with parents right now.       Undergrad at [employer], Masters oral roberts- divinity. Hard to get into ministry.    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 Mentioned getting plugged more in with church    Objective  Objective:  BP 120/78   Pulse 71   Temp 98.2 F (36.8 C)   Ht 5\' 9"  (1.753 m)   Wt 169 lb 3.2 oz (76.7 kg)   SpO2 99%   BMI 24.99 kg/m  Gen: NAD, resting comfortably HEENT: Mucous membranes are moist. Oropharynx normal Neck: no thyromegaly CV: RRR and stable murmur Lungs: CTAB no crackles, wheeze, rhonchi Abdomen: soft/nontender/nondistended/normal bowel sounds. No rebound or guarding.  Ext: no edema Skin: warm, dry Neuro: grossly normal, moves all extremities, PERRLA   Assessment and Plan:   Welcome to Medicare exam completed-  Educated, counseled and referred based on above elements Educated, counseled and referred as appropriate for preventative needs Discussed and documented a written plan for preventiative services and screenings with personalized health advice- After Visit Summary was given to patient which included this plan  4. EKG offered [id number]- patient opts out- gets with cardiology  Status of chronic or acute concerns   # History of mechanical aortic valve replacement in 1994-will now follow up  with Dr. Flora Lipps who will monitor  echocardiogram -Remains on long-term Coumadin for anticoagulation   # Ascending aortic aneurysm S:Measuring at 4.7 cm on CT angiogram 04/24/2022-followed by cardiothoracic surgery. -Also has pulmonary nodule monitored on this A/P: aneurysm stable- continue to monitor - blood pressure is well controlled    #hyperlipidemia #aortic atherosclerosis S: Medication:Rosuvastatin 20 mg Lab Results  Component Value Date   CHOL 165 12/26/2021   HDL 51.80 12/26/2021   LDLCALC 87 12/26/2021   TRIG 130.0 12/26/2021   CHOLHDL 3 12/26/2021   A/P: aortic atherosclerosis (presumed stable)- LDL goal ideally <70 - very slightly above goal but continue current medications for now- recheck at physical  # GERD S:Medication: Pantoprazole 20 mg 2-3 x a year- other than see below  A/P: suspect poor control with worsening cough- noted 2-3x a day before but seems to be more frequent. Processed meats, cookies, perfumes, pollen seem to trigger more often. Years of issues and in last year has been more regular. No hemoptysis -already takes Flonase and claritin -no known asthma or copd -with  increase we opted to have him take pantoprazole 20 mg to daily for a month and if fails to improve refer to pulmonary- thankful he has had reassuring chest CT in recent months  Recommended follow up: 6 months physical Future Appointments  Date Time Provider Department Center  09/26/2022  8:45 AM [facility] None  11/14/2022 11:20 AM O'Neal, Ronnald Ramp, MD [facility] None     Lab/Order associations:   ICD-10-CM   1. Preventative health care  Z00.00     2. Aneurysm of ascending aorta without rupture (HCC)  I71.21     3. Aortic atherosclerosis (HCC)  I70.0     4. Hx of mechanical aortic valve replacement  Z95.2     5. Hypercholesterolemia  E78.00     6. Acquired coagulation disorder (HCC)  D68.9       Meds ordered this encounter  Medications    pantoprazole (PROTONIX) 20 MG tablet    Sig: Take 1 tablet (20 mg total) by mouth daily as needed for heartburn or indigestion.    Dispense:  30 tablet    Refill:  5    Return precautions advised. Tana Conch, MD

## 2022-08-22 NOTE — Patient Instructions (Addendum)
[name] , Thank you for taking time to come for your Medicare Wellness Visit. I appreciate your ongoing commitment to your health goals. Please review the following plan we discussed and let me know if I can assist you in the future.   These are the goals we discussed: -with increase in cough- we opted to have him take pantoprazole 20 mg to daily for a month and if fails to improve refer to pulmonary- thankful he has had reassuring chest CT in recent months Let us know if you get a flu or COVID vaccine this fall.  This is a list of the screening recommended for you and due dates:  Health Maintenance  Topic Date Due   COVID-19 Vaccine (3 - Moderna risk series) 09/07/2022*   Flu Shot  04/07/2023*   Medicare Annual Wellness Visit  08/22/2023   DTaP/Tdap/Td vaccine (2 - Td or Tdap) 11/20/2024   Colon Cancer Screening  08/15/2025   Pneumonia Vaccine  Completed   Hepatitis C Screening  Completed   HIV Screening  Completed   Zoster (Shingles) Vaccine  Completed   HPV Vaccine  Aged Out  *Topic was postponed. The date shown is not the original due date.   6 month physical- sign up on way out- do labs then

## 2022-08-23 ENCOUNTER — Ambulatory Visit: Payer: Medicare Other | Admitting: Family Medicine

## 2022-09-25 ENCOUNTER — Ambulatory Visit: Payer: Medicare Other | Admitting: Cardiology

## 2022-09-26 ENCOUNTER — Ambulatory Visit: Payer: Medicare Other | Attending: Cardiovascular Disease

## 2022-09-26 DIAGNOSIS — Z5181 Encounter for therapeutic drug level monitoring: Secondary | ICD-10-CM

## 2022-09-26 DIAGNOSIS — Z952 Presence of prosthetic heart valve: Secondary | ICD-10-CM

## 2022-09-26 LAB — POCT INR: INR: 4.3 — AB (ref 2.0–3.0)

## 2022-09-26 NOTE — Patient Instructions (Signed)
Description   HOLD today's dose and then continue taking Warfarin 1/2 tablet daily except 1 tablet on Mondays, Wednesdays, and Fridays.  Recheck INR in 3 weeks.  Call us with any medications Changes or concerns # [phone] or [phone]

## 2022-10-15 ENCOUNTER — Telehealth: Payer: Self-pay | Admitting: Family Medicine

## 2022-10-15 DIAGNOSIS — Z952 Presence of prosthetic heart valve: Secondary | ICD-10-CM

## 2022-10-15 MED ORDER — ROSUVASTATIN CALCIUM 20 MG PO TABS
ORAL_TABLET | ORAL | 4 refills | Status: DC
Start: 2022-10-15 — End: 2023-10-13

## 2022-10-15 NOTE — Telephone Encounter (Signed)
Prescription Request  10/15/2022  LOV: 08/22/2022  What is the name of the medication or equipment?  rosuvastatin (CRESTOR) 20 MG tablet    Have you contacted your pharmacy to request a refill? No   Which pharmacy would you like this sent to? [id number] - [place], [place] - [address] [facility] [address] [place] [address] Phone: [phone] Fax: [phone]   Patient notified that their request is being sent to the clinical staff for review and that they should receive a response within 2 business days.   Please advise at Mobile [phone] (mobile)

## 2022-10-15 NOTE — Telephone Encounter (Signed)
Refill sent to requested pharmacy.

## 2022-10-18 ENCOUNTER — Ambulatory Visit: Payer: Medicare Other | Attending: Cardiology | Admitting: *Deleted

## 2022-10-18 DIAGNOSIS — Z952 Presence of prosthetic heart valve: Secondary | ICD-10-CM | POA: Diagnosis not present

## 2022-10-18 DIAGNOSIS — Z5181 Encounter for therapeutic drug level monitoring: Secondary | ICD-10-CM

## 2022-10-18 LAB — POCT INR: INR: 4.1 — AB (ref 2.0–3.0)

## 2022-10-18 NOTE — Patient Instructions (Addendum)
Description   HOLD today's dose and then START taking Warfarin 1/2 tablet daily except 1 tablet on Mondays and Fridays. Recheck INR in 4 weeks.  Call us with any medications Changes or concerns # [phone] or [phone]

## 2022-11-04 ENCOUNTER — Other Ambulatory Visit: Payer: Self-pay

## 2022-11-04 DIAGNOSIS — Z952 Presence of prosthetic heart valve: Secondary | ICD-10-CM

## 2022-11-04 MED ORDER — WARFARIN SODIUM 5 MG PO TABS
ORAL_TABLET | ORAL | 1 refills | Status: DC
Start: 2022-11-04 — End: 2023-01-20

## 2022-11-12 NOTE — Progress Notes (Unsigned)
Cardiology Office Note:  .   Date:  11/14/2022  ID:  Ryan Lamb, DOB 1955/06/09, MRN [id number] PCP: Shelva Majestic, MD  [facility] Providers Cardiologist:  Reatha Harps, MD { History of Present Illness: .   Ryan Lamb is a 67 y.o. male with history of mechanical AVR, ascending aortic aneurysm who presents for follow-up.    History of Present Illness   Ryan Lamb, a 67 year old gentleman with a history of mechanical aortic valve replacement in 1994 and ascending aortic aneurysm, presents for routine follow-up. He was previously under the care of Dr. Shari Prows but is now transitioning to a new provider. He reports no new symptoms or changes in his health status. His EKG shows normal sinus rhythm with a heart rate of 92 and no acute ischemic changes.  He had open heart surgery at the age of 68, during which a prosthetic valve was placed. In 1994, due to aortic stenosis, he underwent another surgery for a mechanical aortic valve replacement. He has been on Coumadin since then, with an INR goal of 2.5 to 3.5. He denies any history of atrial fibrillation, heart attack, or stroke.  He also has an ascending aortic aneurysm, which has been stable and is being monitored. He denies any symptoms related to this condition. He has no other known medical conditions and denies any history of high blood pressure or diabetes. He is also on a cholesterol medication, and his most recent LDL level was 87.  His father had a history of bypass surgery and was on a pacemaker, and his mother had a bovine valve placed about 15-20 years ago. He denies any personal history of smoking or alcohol use. He works part-time at [employer] and is single with no children.          Problem List AVR -23 mm St. Jude Mechanical 1994 -congenital aortic stenosis -2.5-3.5 INR goal 2. Ascending aortic aneurysm  -47 mm 04/24/2022    ROS: All other ROS reviewed and negative.  Pertinent positives noted in the HPI.     Studies Reviewed: Marland Kitchen   EKG Interpretation Date/Time:  Thursday November 14 2022 11:12:38 EST Ventricular Rate:  92 PR Interval:  168 QRS Duration:  90 QT Interval:  334 QTC Calculation: 413 R Axis:   38  Text Interpretation: Normal sinus rhythm Low voltage QRS Confirmed by Lennie Odor [phone]) on 11/14/2022 11:20:28 AM   Physical Exam:   VS:  BP 110/72 (BP Location: Left Arm, Patient Position: Sitting, Cuff Size: Normal)   Pulse 92   Ht 5\' 9"  (1.753 m)   Wt 170 lb 6.4 oz (77.3 kg)   SpO2 98%   BMI 25.16 kg/m    Wt Readings from Last 3 Encounters:  11/14/22 170 lb 6.4 oz (77.3 kg)  08/22/22 169 lb 3.2 oz (76.7 kg)  06/17/22 167 lb 8.8 oz (76 kg)    GEN: Well nourished, well developed in no acute distress NECK: No JVD; No carotid bruits CARDIAC: RRR, mechanical S2 no murmurs, rubs, gallops RESPIRATORY:  Clear to auscultation without rales, wheezing or rhonchi  ABDOMEN: Soft, non-tender, non-distended EXTREMITIES:  No edema; No deformity  ASSESSMENT AND PLAN: .   Assessment and Plan    Mechanical Aortic Valve Replacement Stable with mean gradient of 11 mmHg on last echo. INR goal of 2.5-3.5, which is higher than typical due to historical reasons. No symptoms of valve dysfunction. -Continue Coumadin with INR goal of 2.5-3.5. -Annual follow-up.  Ascending Aortic Aneurysm Stable at 47mm. No symptoms or changes. -Continue yearly surveillance. -Consider spacing out to every 2 years if stability continues.  Hyperlipidemia Well controlled on Crestor with most recent LDL of 87. -Continue Crestor.  General Health Maintenance -Annual follow-up with cardiology.              Follow-up: Return in about 1 year (around 11/14/2023).  Time Spent with Patient: I have spent a total of 25 minutes caring for this patient today face to face, ordering and reviewing labs/tests, reviewing prior records/medical history, examining the patient,  establishing an assessment and plan, communicating results/findings to the patient/family, and documenting in the medical record.   Signed, Lenna Gilford. Flora Lipps, MD, [facility]  [facility]  [address] [place], [place] [address] [phone]  11:32 AM

## 2022-11-14 ENCOUNTER — Ambulatory Visit: Payer: Medicare Other

## 2022-11-14 ENCOUNTER — Ambulatory Visit: Payer: Medicare Other | Attending: Cardiovascular Disease | Admitting: Cardiovascular Disease

## 2022-11-14 ENCOUNTER — Encounter: Payer: Self-pay | Admitting: Cardiovascular Disease

## 2022-11-14 VITALS — BP 110/72 | HR 92 | Ht 69.0 in | Wt 170.4 lb

## 2022-11-14 DIAGNOSIS — Z952 Presence of prosthetic heart valve: Secondary | ICD-10-CM

## 2022-11-14 DIAGNOSIS — I700 Atherosclerosis of aorta: Secondary | ICD-10-CM

## 2022-11-14 DIAGNOSIS — Z7901 Long term (current) use of anticoagulants: Secondary | ICD-10-CM

## 2022-11-14 DIAGNOSIS — I7121 Aneurysm of the ascending aorta, without rupture: Secondary | ICD-10-CM

## 2022-11-14 DIAGNOSIS — Z5181 Encounter for therapeutic drug level monitoring: Secondary | ICD-10-CM

## 2022-11-14 LAB — POCT INR: INR: 2.7 (ref 2.0–3.0)

## 2022-11-14 NOTE — Patient Instructions (Signed)
Medication Instructions:  ?Your physician recommends that you continue on your current medications as directed. Please refer to the Current Medication list given to you today.  ?*If you need a refill on your cardiac medications before your next appointment, please call your pharmacy* ? ? ?Lab Work: ?None ?If you have labs (blood work) drawn today and your tests are completely normal, you will receive your results only by: ?MyChart Message (if you have MyChart) OR ?A paper copy in the mail ?If you have any lab test that is abnormal or we need to change your treatment, we will call you to review the results. ? ? ?Testing/Procedures: ?None ? ? ?Follow-Up: ?At [facility], you and your health needs are our priority.  As part of our continuing mission to provide you with exceptional heart care, we have created designated Provider Care Teams.  These Care Teams include your primary Cardiologist (physician) and Advanced Practice Providers (APPs -  Physician Assistants and Nurse Practitioners) who all work together to provide you with the care you need, when you need it. ? ?We recommend signing up for the patient portal called "MyChart".  Sign up information is provided on this After Visit Summary.  MyChart is used to connect with patients for Virtual Visits (Telemedicine).  Patients are able to view lab/test results, encounter notes, upcoming appointments, etc.  Non-urgent messages can be sent to your provider as well.   ?To learn more about what you can do with MyChart, go to [email or web address].   ? ?Your next appointment:   ?1 year(s) ? ?The format for your next appointment:   ?In Person ? ?Provider:   ?Evalina Field, MD  ? ? ?Other Instructions ?  ?

## 2022-11-14 NOTE — Patient Instructions (Signed)
Description   Continue taking Warfarin 1/2 tablet daily except 1 tablet on Mondays and Fridays. Recheck INR in 4 weeks.  Call us with any medications Changes or concerns # [phone] or [phone]

## 2022-11-19 DIAGNOSIS — K080 Exfoliation of teeth due to systemic causes: Secondary | ICD-10-CM | POA: Diagnosis not present

## 2022-12-12 ENCOUNTER — Ambulatory Visit: Payer: Medicare Other | Attending: Internal Medicine | Admitting: *Deleted

## 2022-12-12 DIAGNOSIS — Z952 Presence of prosthetic heart valve: Secondary | ICD-10-CM

## 2022-12-12 DIAGNOSIS — Z5181 Encounter for therapeutic drug level monitoring: Secondary | ICD-10-CM | POA: Diagnosis not present

## 2022-12-12 LAB — POCT INR: INR: 4.4 — AB (ref 2.0–3.0)

## 2022-12-12 NOTE — Patient Instructions (Signed)
Description   Do not take any warfarin today then continue taking Warfarin 1/2 tablet daily except 1 tablet on Mondays and Fridays. Recheck INR in 2 weeks.  Call us with any medications Changes or concerns # [phone] or [phone]

## 2022-12-26 ENCOUNTER — Ambulatory Visit: Payer: Medicare Other | Attending: Cardiology | Admitting: *Deleted

## 2022-12-26 DIAGNOSIS — Z5181 Encounter for therapeutic drug level monitoring: Secondary | ICD-10-CM | POA: Diagnosis not present

## 2022-12-26 DIAGNOSIS — Z952 Presence of prosthetic heart valve: Secondary | ICD-10-CM

## 2022-12-26 LAB — POCT INR: INR: 3.9 — AB (ref 2.0–3.0)

## 2022-12-26 NOTE — Patient Instructions (Addendum)
Description   Do not take any warfarin today then START taking Warfarin 1/2 tablet daily except 1 tablet on Mondays. Recheck INR in 3 weeks.  Call us with any medications Changes or concerns # [phone] or [phone]

## 2023-01-16 ENCOUNTER — Ambulatory Visit: Payer: Medicare Other | Attending: Cardiology | Admitting: *Deleted

## 2023-01-16 DIAGNOSIS — Z952 Presence of prosthetic heart valve: Secondary | ICD-10-CM | POA: Diagnosis not present

## 2023-01-16 DIAGNOSIS — Z5181 Encounter for therapeutic drug level monitoring: Secondary | ICD-10-CM

## 2023-01-16 LAB — POCT INR: INR: 3.6 — AB (ref 2.0–3.0)

## 2023-01-16 NOTE — Patient Instructions (Signed)
 Description   Today have a nice green vegetable then START taking Warfarin 1/2 tablet daily. Recheck INR in 3 weeks.  Call us with any medications Changes or concerns # [phone] or [phone]

## 2023-01-19 ENCOUNTER — Other Ambulatory Visit: Payer: Self-pay | Admitting: Cardiology

## 2023-01-19 DIAGNOSIS — Z952 Presence of prosthetic heart valve: Secondary | ICD-10-CM

## 2023-01-26 ENCOUNTER — Other Ambulatory Visit: Payer: Self-pay | Admitting: Family Medicine

## 2023-02-01 ENCOUNTER — Other Ambulatory Visit: Payer: Self-pay | Admitting: Internal Medicine

## 2023-02-01 DIAGNOSIS — J321 Chronic frontal sinusitis: Secondary | ICD-10-CM

## 2023-02-06 ENCOUNTER — Ambulatory Visit: Payer: Medicare Other | Attending: Cardiology | Admitting: *Deleted

## 2023-02-06 DIAGNOSIS — Z952 Presence of prosthetic heart valve: Secondary | ICD-10-CM

## 2023-02-06 DIAGNOSIS — Z5181 Encounter for therapeutic drug level monitoring: Secondary | ICD-10-CM | POA: Diagnosis not present

## 2023-02-06 LAB — POCT INR: INR: 1.8 — AB (ref 2.0–3.0)

## 2023-02-06 NOTE — Patient Instructions (Signed)
Description   Today take 1 tablet of warfarin then START taking Warfarin 1/2 tablet daily except 1 tablet on Sundays. Recheck INR in 2 weeks.  Call us with any medications Changes or concerns # [phone] or [phone]

## 2023-02-20 ENCOUNTER — Ambulatory Visit: Payer: Medicare Other | Attending: Cardiovascular Disease

## 2023-02-20 DIAGNOSIS — Z5181 Encounter for therapeutic drug level monitoring: Secondary | ICD-10-CM | POA: Diagnosis not present

## 2023-02-20 DIAGNOSIS — Z952 Presence of prosthetic heart valve: Secondary | ICD-10-CM

## 2023-02-20 LAB — POCT INR: INR: 2.8 (ref 2.0–3.0)

## 2023-02-20 NOTE — Patient Instructions (Signed)
Description   Continue on same dosage of Warfarin 1/2 tablet daily except 1 tablet on Sundays. Recheck INR in 4 weeks.  Call us with any medications Changes or concerns # [phone] or [phone]

## 2023-03-06 ENCOUNTER — Ambulatory Visit (INDEPENDENT_AMBULATORY_CARE_PROVIDER_SITE_OTHER): Payer: Medicare Other | Admitting: Family Medicine

## 2023-03-06 ENCOUNTER — Encounter: Payer: Self-pay | Admitting: Family Medicine

## 2023-03-06 VITALS — BP 104/74 | HR 85 | Temp 97.2°F | Ht 69.0 in | Wt 170.0 lb

## 2023-03-06 DIAGNOSIS — Z0000 Encounter for general adult medical examination without abnormal findings: Secondary | ICD-10-CM

## 2023-03-06 DIAGNOSIS — E663 Overweight: Secondary | ICD-10-CM | POA: Diagnosis not present

## 2023-03-06 DIAGNOSIS — R053 Chronic cough: Secondary | ICD-10-CM

## 2023-03-06 DIAGNOSIS — Z131 Encounter for screening for diabetes mellitus: Secondary | ICD-10-CM | POA: Diagnosis not present

## 2023-03-06 DIAGNOSIS — E7800 Pure hypercholesterolemia, unspecified: Secondary | ICD-10-CM | POA: Diagnosis not present

## 2023-03-06 DIAGNOSIS — Z125 Encounter for screening for malignant neoplasm of prostate: Secondary | ICD-10-CM | POA: Diagnosis not present

## 2023-03-06 LAB — CBC WITH DIFFERENTIAL/PLATELET
Basophils Absolute: 0.1 10*3/uL (ref 0.0–0.1)
Basophils Relative: 0.9 % (ref 0.0–3.0)
Eosinophils Absolute: 0.2 10*3/uL (ref 0.0–0.7)
Eosinophils Relative: 3.4 % (ref 0.0–5.0)
HCT: 45.4 % (ref 39.0–52.0)
Hemoglobin: 15.4 g/dL (ref 13.0–17.0)
Lymphocytes Relative: 22 % (ref 12.0–46.0)
Lymphs Abs: 1.3 10*3/uL (ref 0.7–4.0)
MCHC: 33.9 g/dL (ref 30.0–36.0)
MCV: 89.4 fL (ref 78.0–100.0)
Monocytes Absolute: 0.5 10*3/uL (ref 0.1–1.0)
Monocytes Relative: 7.6 % (ref 3.0–12.0)
Neutro Abs: 4 10*3/uL (ref 1.4–7.7)
Neutrophils Relative %: 66.1 % (ref 43.0–77.0)
Platelets: 205 10*3/uL (ref 150.0–400.0)
RBC: 5.08 Mil/uL (ref 4.22–5.81)
RDW: 13 % (ref 11.5–15.5)
WBC: 6.1 10*3/uL (ref 4.0–10.5)

## 2023-03-06 LAB — LIPID PANEL
Cholesterol: 150 mg/dL (ref 0–200)
HDL: 45.9 mg/dL (ref >39.00)
LDL Cholesterol: 79 mg/dL (ref 0–99)
NonHDL: 104.46
Total CHOL/HDL Ratio: 3
Triglycerides: 126 mg/dL (ref 0.0–149.0)
VLDL: 25.2 mg/dL (ref 0.0–40.0)

## 2023-03-06 LAB — COMPREHENSIVE METABOLIC PANEL
ALT: 17 U/L (ref 0–53)
AST: 21 U/L (ref 0–37)
Albumin: 4.4 g/dL (ref 3.5–5.2)
Alkaline Phosphatase: 64 U/L (ref 39–117)
BUN: 12 mg/dL (ref 6–23)
CO2: 30 meq/L (ref 19–32)
Calcium: 9.1 mg/dL (ref 8.4–10.5)
Chloride: 105 meq/L (ref 96–112)
Creatinine, Ser: 1.23 mg/dL (ref 0.40–1.50)
GFR: 61.21 mL/min (ref >60.00)
Glucose, Bld: 96 mg/dL (ref 70–99)
Potassium: 3.9 meq/L (ref 3.5–5.1)
Sodium: 141 meq/L (ref 135–145)
Total Bilirubin: 1 mg/dL (ref 0.2–1.2)
Total Protein: 6.7 g/dL (ref 6.0–8.3)

## 2023-03-06 LAB — HEMOGLOBIN A1C: Hgb A1c MFr Bld: 5.2 % (ref 4.6–6.5)

## 2023-03-06 LAB — PSA, MEDICARE: PSA: 1.17 ng/mL (ref 0.10–4.00)

## 2023-03-06 NOTE — Patient Instructions (Addendum)
 Call pulmonary aT # listed below if you don't hear within a week  -encouraged to restart Flonase and claritin in the meantime and continue pantoprazole.   Please stop by lab before you go If you have mychart- we will send your results within 3 business days of [place] receiving them.  If you do not have mychart- we will call you about results within 5 business days of [place] receiving them.  *please also note that you will see labs on mychart as soon as they post. I will later go in and write notes on them- will say "notes from Dr. Durene Cal"   Recommended follow up: Return in about 1 year (around 03/05/2024) for physical or sooner if needed.Schedule b4 you leave.

## 2023-03-06 NOTE — Progress Notes (Signed)
 Phone: [phone]   Subjective:  Patient presents today for their annual physical. Chief complaint-noted.   See problem oriented charting- ROS- full  review of systems was completed and negative  except for: hearing loss, tinitus, ongoing cough, seasonal allergies, food allergies  The following were reviewed and entered/updated in epic: Past Medical History:  Diagnosis Date   Aortic stenosis    Aortic valve prosthesis present    Coronary artery disease    Dissecting aortic aneurysm (any part), thoracic (HCC)    Diverticulosis    Family history of adverse reaction to anesthesia    mother had complications "years ago"   Gastric ulcer    due to nsaids in past   Hypercholesterolemia    Mechanical heart valve present    Patient Active Problem List   Diagnosis Date Noted   Ascending aortic aneurysm (HCC) 10/18/2010    Priority: High   Hx of mechanical aortic valve replacement     Priority: High   Aortic atherosclerosis (HCC) 12/25/2020    Priority: Medium    History of adenomatous polyp of colon 12/11/2015    Priority: Medium    GERD (gastroesophageal reflux disease) 04/22/2011    Priority: Medium    Hypercholesterolemia     Priority: Medium    Acquired coagulation disorder (HCC) 12/23/2019    Priority: Low   Encounter for therapeutic drug monitoring 12/08/2015    Priority: Low   Long term (current) use of anticoagulants 05/08/2010    Priority: Low   Pulmonary nodule, left 05/13/2022   Aortopathy associated with bicuspid aortic valve 04/24/2022   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/19/2021   Past Surgical History:  Procedure Laterality Date   CARDIAC VALVE REPLACEMENT     EEC     with propofol    ESOPHAGOGASTRODUODENOSCOPY  01/2010, 04/2010   ESOPHAGOGASTRODUODENOSCOPY     cauterize after nsaid related ulcer   EXCISION MASS ABDOMINAL Bilateral 06/17/2022   Procedure: EXCISION MASS LEFT UPPER ABDOMINAL WALL AND EXCISION MASS RIGHT LOWER ABDOMINAL  WALL;  Surgeon: Almond Lint, MD;  Location: [facility];  Service: General;  Laterality: Bilateral;   EXCISION MASS LOWER EXTREMETIES Right 06/17/2022   Procedure: EXCISION OF RIGHT ANTERIOR THIGH MASS;  Surgeon: Almond Lint, MD;  Location: [facility];  Service: General;  Laterality: Right;   EXCISION MASS UPPER EXTREMETIES Right 06/17/2022   Procedure: EXCISION MASS RIGHT LOWER ARM;  Surgeon: Almond Lint, MD;  Location: [facility];  Service: General;  Laterality: Right;   EXCISION OF BACK LESION Right 06/17/2022   Procedure: EXCISION OF RIGHT LOWER BACK MASS;  Surgeon: Almond Lint, MD;  Location: [facility];  Service: General;  Laterality: Right;   EXPLORATION POST OPERATIVE OPEN HEART  April 02, 1992, 1972    Family History  Problem Relation Age of Onset   Coronary artery disease Mother        CABG. 81 in 2017-04-02.    Coronary artery disease Father        aortic valve replacement. died at 62 in 03-Apr-2022   Skin cancer Father         26 in April 02, 2017.    Healthy Sister    Schizophrenia Sister        home health care   Healthy Brother        overweight   GI problems Neg Hx     Medications- reviewed and updated Current Outpatient Medications  Medication Sig Dispense Refill   fluticasone (FLONASE)  50 MCG/ACT nasal spray SHAKE LIQUID AND USE 2 SPRAYS IN EACH NOSTRIL DAILY 16 g 6   loratadine (CLARITIN) 10 MG tablet Take 1 tablet (10 mg total) by mouth daily. 30 tablet 11   MULTIPLE VITAMIN PO Take 1 tablet by mouth daily.      pantoprazole (PROTONIX) 20 MG tablet TAKE 1 TABLET(20 MG) BY MOUTH DAILY AS NEEDED FOR HEARTBURN OR INDIGESTION 30 tablet 5   rosuvastatin (CRESTOR) 20 MG tablet TAKE 1 TABLET(20 MG) BY MOUTH DAILY 90 tablet 4   Saline (SIMPLY SALINE) 0.9 % AERS Place 2 each into the nose daily as needed. 500 mL 1   warfarin (COUMADIN) 5 MG tablet TAKE 1/2 TO 1 TABLET BY MOUTH EVERY DAY AS DIRECTED BY COUMADIN CLINIC 70 tablet 1   No  current facility-administered medications for this visit.    Allergies-reviewed and updated Allergies  Allergen Reactions   Quinolones     Patient was warned about not using Cipro and similar antibiotics. Recent studies have raised concern that fluoroquinolone antibiotics could be associated with an increased risk of aortic aneurysm Fluoroquinolones have non-antimicrobial properties that might jeopardise the integrity of the extracellular matrix of the vascular wall In a  propensity score matched cohort study in [place], there was a 66% increased rate of aortic aneurysm or dissection associated with oral fluoroquinolone use, compared wit   Penicillins Rash    Reaction unknown may have been a rash    Social History   Social History Narrative   Single. Lives with parents right now.       Undergrad at [employer], Masters oral roberts- divinity. Hard to get into ministry.    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 Oropharynx normal Neck: no thyromegaly CV: RRR no murmurs rubs or gallops Lungs: CTAB no crackles, wheeze, rhonchi Abdomen: soft/nontender/nondistended/normal bowel sounds. No rebound or guarding.  Ext: no edema Skin: warm, dry Neuro: grossly normal, moves all extremities, PERRLA Declines genitourinary and rectal exam    Assessment and Plan  68 y.o. male presenting for annual physical.  Health Maintenance counseling: 1. Anticipatory guidance: Patient counseled regarding regular dental exams -q6 months, eye exams -yearly,  avoiding smoking and second hand smoke , limiting alcohol to 2 beverages per day - doesn't drink, no illicit drugs .   2. Risk factor  reduction:  Advised patient of need for regular exercise and diet rich and fruits and vegetables to reduce risk of heart attack and stroke.  Exercise- walking 2-3 days a week outside of work plus active with work 1.5-2 miles at work up to 3 miles- down from 8 to 5 hours so a few less than last year.  Diet/weight management-up 4 lbs from last year. Trying to cut down on chips and sugary drinks Wt Readings from Last 3 Encounters:  03/06/23 170 lb (77.1 kg)  11/14/22 170 lb 6.4 oz (77.3 kg)  08/22/22 169 lb 3.2 oz (76.7 kg)  3. Immunizations/screenings/ancillary studies- opts out of COVID shot and flu shot Immunization History  Administered Date(s) Administered   Fluad Quad(high Dose 65+) 12/26/2021  Influenza Inj Mdck Quad Pf 09/17/2018   Influenza,inj,Quad PF,6+ Mos 11/21/2014, 08/10/2015, 12/12/2016, 10/04/2017, 10/25/2020   Moderna Sars-Covid-2 Vaccination 05/12/2019, 06/09/2019   PNEUMOCOCCAL CONJUGATE-20 12/26/2021   Tdap 11/21/2014   Zoster Recombinant(Shingrix) 12/15/2017, 03/18/2018  4. Prostate cancer screening- low risk prior trend- update psa today   Lab Results  Component Value Date   PSA 1.62 12/26/2021   PSA 1.79 12/25/2020   PSA 1.25 12/23/2019   5. Colon cancer screening - 08/16/15 with 10 year repeat planned. No melena or bright red blood per rectum  6. Skin cancer screening- has seen Dr. Donzetta Starch within last year. advised regular sunscreen use. Denies worrisome, changing, or new skin lesions. Also had some lipomas/ cysts removed with surgeon 7. Smoking associated screening (lung cancer screening, AAA screen 65-75, UA)- last smoked age 37- 2.5 pack years. Abdominal aortic aneurysm screen was negative 01/17/22. No regular screening 8. STD screening - not dating  Status of chronic or acute concerns   # History of mechanical aortic valve replacement in 1994-follows with Dr. Flora Lipps who monitors echocardiogram reports every 5 years and last in 2023 -Remains on long-term  Coumadin for anticoagulation   # Ascending aortic aneurysm S:Measuring at 4.7 cm on CT angiogram 04/24/2022-followed by cardiothoracic surgery. -Also has pulmonary nodule monitored on this A/P: reports plans to update this in coming months- hopefully stable   #hyperlipidemia #aortic atherosclerosis S: Medication:Rosuvastatin 20 mg  Lab Results  Component Value Date   CHOL 165 12/26/2021   HDL 51.80 12/26/2021   LDLCALC 87 12/26/2021   TRIG 130.0 12/26/2021   CHOLHDL 3 12/26/2021   A/P: aortic atherosclerosis (presumed stable)- LDL goal ideally <70 - hopefully improved as hair above goal last time- continue current medications for now   #Chronic cough- previously thought to be GERD only # GERD S:Medication: Pantoprazole 20 mg daily up from 2-3x a week helped slightly with cough - feels coughing fits have increased. Also with some sinus irritation. Not doing Flonase or claritin A/P:   With ongoing cough offered CXR- but he has upcomign CT antiogram and prefers to wait on that -encouraged to restart Flonase and claritin in the meantime and continue pantoprazole.  - no asthma history  Recommended follow up: Return in about 1 year (around 03/05/2024) for physical or sooner if needed.Schedule b4 you leave. Future Appointments  Date Time Provider Department Center  03/20/2023  8:45 AM [facility] None   Lab/Order associations: fasting other than cough drop and coffee but no sugar   ICD-10-CM   1. Preventative health care  Z00.00     2. Hypercholesterolemia  E78.00 Comprehensive metabolic panel    CBC with Differential/Platelet    Lipid panel    3. Screening for prostate cancer  Z12.5 PSA, Medicare    4. Screening for diabetes mellitus  Z13.1 Hemoglobin A1c    5. Overweight  E66.3 Hemoglobin A1c    6. Chronic cough  R05.3 Ambulatory referral to Pulmonology     No orders of the defined types were placed in this encounter.  Return precautions advised.   Tana Conch, MD

## 2023-03-14 ENCOUNTER — Other Ambulatory Visit: Payer: Self-pay | Admitting: Thoracic Surgery (Cardiothoracic Vascular Surgery)

## 2023-03-14 DIAGNOSIS — I7121 Aneurysm of the ascending aorta, without rupture: Secondary | ICD-10-CM

## 2023-03-20 ENCOUNTER — Ambulatory Visit: Payer: Medicare Other | Attending: Cardiology | Admitting: *Deleted

## 2023-03-20 DIAGNOSIS — Z952 Presence of prosthetic heart valve: Secondary | ICD-10-CM

## 2023-03-20 DIAGNOSIS — Z5181 Encounter for therapeutic drug level monitoring: Secondary | ICD-10-CM

## 2023-03-20 LAB — POCT INR: POC INR: 2.4

## 2023-03-20 NOTE — Patient Instructions (Signed)
 Description   Take 1 tablet of warfarin today and then continue on same dosage of Warfarin 1/2 tablet daily except 1 tablet on Sundays. Recheck INR in 3 weeks.  Call us with any medications Changes or concerns # [phone] or [phone]

## 2023-04-03 DIAGNOSIS — K080 Exfoliation of teeth due to systemic causes: Secondary | ICD-10-CM | POA: Diagnosis not present

## 2023-04-10 ENCOUNTER — Ambulatory Visit: Attending: Cardiology

## 2023-04-10 DIAGNOSIS — Z5181 Encounter for therapeutic drug level monitoring: Secondary | ICD-10-CM

## 2023-04-10 DIAGNOSIS — Z952 Presence of prosthetic heart valve: Secondary | ICD-10-CM | POA: Diagnosis not present

## 2023-04-10 LAB — POCT INR: INR: 3.2 — AB (ref 2.0–3.0)

## 2023-04-10 NOTE — Patient Instructions (Addendum)
 Description   Continue on same dosage of Warfarin 1/2 tablet daily except 1 tablet on Sundays.  Recheck INR in 4 weeks.  Call us with any medications Changes or concerns #[phone]

## 2023-04-22 ENCOUNTER — Other Ambulatory Visit

## 2023-04-23 ENCOUNTER — Ambulatory Visit
Admission: RE | Admit: 2023-04-23 | Discharge: 2023-04-23 | Disposition: A | Discharge status: Home / Self Care | Source: Ambulatory Visit | Attending: Thoracic Surgery (Cardiothoracic Vascular Surgery) | Admitting: Thoracic Surgery (Cardiothoracic Vascular Surgery)

## 2023-04-23 DIAGNOSIS — R911 Solitary pulmonary nodule: Secondary | ICD-10-CM | POA: Diagnosis not present

## 2023-04-23 DIAGNOSIS — I7121 Aneurysm of the ascending aorta, without rupture: Secondary | ICD-10-CM

## 2023-04-23 MED ORDER — IOPAMIDOL (ISOVUE-300) INJECTION 61%
100.0000 mL | Freq: Once | INTRAVENOUS | Status: AC | PRN
  Administered 2023-04-23: 100 mL via INTRAVENOUS

## 2023-04-29 ENCOUNTER — Encounter: Payer: Self-pay | Admitting: Thoracic Surgery (Cardiothoracic Vascular Surgery)

## 2023-04-29 ENCOUNTER — Ambulatory Visit: Admitting: Thoracic Surgery (Cardiothoracic Vascular Surgery)

## 2023-04-29 VITALS — BP 135/79 | HR 74 | Resp 20 | Ht 69.0 in | Wt 174.4 lb

## 2023-04-29 DIAGNOSIS — I7121 Aneurysm of the ascending aorta, without rupture: Secondary | ICD-10-CM

## 2023-04-29 NOTE — Progress Notes (Signed)
 [address]       [name] [address]             [phone]    HPI: [name] returns for follow-up of his ascending aneurysm and left upper lobe lung nodule.  Ryan Lamb is a 68 year old man with a past medical history significant for bicuspid aortic valve, mechanical aortic valve replacement, ascending aortic aneurysm, thoracic aortic atherosclerosis, left upper lobe lung nodule, hypertension, and hyperlipidemia.  He had aortic valvuloplasty in 1971 and a mechanical aortic valve replacement by Dr. Nicanor Barge in 1994.  He has been followed for an ascending aneurysm since 2011.  Has remained stable over time and he has been followed on an annual basis.  Was noted to have a left upper lobe lung nodule as far back as 2013.  Has been followed for that as well.  He was followed by Dr. Nicanor Barge initially and then by Dr. Jeb Miner after Dr. Nicanor Barge retired.  Last seen in the office a year ago.  Aneurysm stable.  PET/CT showed no activity in the left upper lobe lung nodule.  In the interim since that visit he has been feeling well.  He says he has gained a few pounds.  Has not been checking his blood pressure regularly.  No chest pain, pressure, or tightness.  Past Medical History:  Diagnosis Date   Aortic stenosis    Aortic valve prosthesis present    Coronary artery disease    Dissecting aortic aneurysm (any part), thoracic (HCC)    Diverticulosis    Family history of adverse reaction to anesthesia    mother had complications "years ago"   Gastric ulcer    due to nsaids in past   Hypercholesterolemia    Mechanical heart valve present     Current Outpatient Medications  Medication Sig Dispense Refill   fluticasone  (FLONASE ) 50 MCG/ACT nasal spray SHAKE LIQUID AND USE 2 SPRAYS IN EACH NOSTRIL DAILY 16 g 6   loratadine  (CLARITIN ) 10 MG tablet Take 1 tablet (10 mg total) by mouth daily. 30 tablet 11   MULTIPLE VITAMIN PO Take 1 tablet by mouth daily.       pantoprazole  (PROTONIX ) 20 MG tablet TAKE 1 TABLET(20 MG) BY MOUTH DAILY AS NEEDED FOR HEARTBURN OR INDIGESTION 30 tablet 5   rosuvastatin  (CRESTOR ) 20 MG tablet TAKE 1 TABLET(20 MG) BY MOUTH DAILY 90 tablet 4   Saline (SIMPLY SALINE) 0.9 % AERS Place 2 each into the nose daily as needed. 500 mL 1   warfarin (COUMADIN ) 5 MG tablet TAKE 1/2 TO 1 TABLET BY MOUTH EVERY DAY AS DIRECTED BY COUMADIN  CLINIC 70 tablet 1   No current facility-administered medications for this visit.    Physical Exam BP 135/79 (BP Location: Left Arm, Patient Position: Sitting, Cuff Size: Normal)   Pulse 74   Resp 20   Ht 5\' 9"  (1.753 m)   Wt 174 lb 6.4 oz (79.1 kg)   SpO2 96% Comment: RA  BMI 25.11 kg/m  68 year old man in no acute distress Alert and oriented x 3 with no focal deficits Lungs clear with equal breath sounds bilaterally Cardiac regular rate and rhythm with good valve click and 2/6 systolic murmur No carotid bruits No peripheral edema  Diagnostic Tests: CT ANGIOGRAPHY CHEST WITH CONTRAST   TECHNIQUE: Multidetector CT imaging of the chest was performed using the standard protocol during bolus administration of intravenous contrast. Multiplanar CT image reconstructions and MIPs were obtained to evaluate  the vascular anatomy.   RADIATION DOSE REDUCTION: This exam was performed according to the departmental dose-optimization program which includes automated exposure control, adjustment of the mA and/or kV according to patient size and/or use of iterative reconstruction technique.   CONTRAST:  100mL ISOVUE -300 IOPAMIDOL  (ISOVUE -300) INJECTION 61%   COMPARISON:  CT angiogram chest 04/24/2022. CT chest 12/28/2015. He   FINDINGS: Cardiovascular: Ascending aortic aneurysm measuring up to 4.7 is unchanged from prior. There is no evidence for dissection. Origin the great vessels appears within normal limits. Heart size is within normal limits. There is no pericardial effusion.    Mediastinum/Nodes: The thyroid  gland is heterogeneous. E visualized esophagus is within normal limits. No enlarged lymph nodes are identified.   Lungs/Pleura: 8 mm left upper lobe nodule previously measures 6 mm. The lungs are otherwise clear. There is no pleural effusion or pneumothorax.   Upper Abdomen: No acute abnormality. In left renal cyst measures 2.2 cm.   Musculoskeletal: Sternotomy wires are again seen. There is bilateral gynecomastia, unchanged. No acute osseous abnormality.   Review of the MIP images confirms the above findings.   IMPRESSION: 1. Stable ascending aortic aneurysm measuring 4.7 cm. No acute cardiopulmonary process. Ascending thoracic aortic aneurysm. Recommend semi-annual imaging followup by CTA or MRA and referral to cardiothoracic surgery if not already obtained. This recommendation follows 2010 ACCF/AHA/AATS/ACR/ASA/SCA/SCAI/SIR/STS/SVM Guidelines for the Diagnosis and Management of Patients With Thoracic Aortic Disease. Circulation. 2010; 121: N629-B284. Aortic aneurysm NOS (ICD10-I71.9) 2. 8 mm left upper lobe nodule previously measures 6 mm. Findings are indeterminate given mild growth. Consider one of the following in 3 months for both low-risk and high-risk individuals: (a) repeat chest CT, (b) follow-up PET-CT, or (c) tissue sampling. This recommendation follows the consensus statement: Guidelines for Management of Incidental Pulmonary Nodules Detected on CT Images: From the Fleischner Society 2017; Radiology 2017; 284:228-243.     Electronically Signed   By: Tyron Gallon M.D.   On: 04/23/2023 15:35 I personally reviewed the CT images.  No change in the 4.7 cm ascending aneurysm.  Minimal growth of left upper lobe nodule dating back several years.  Impression: Ryan Lamb is a 68 year old man with a past medical history significant for bicuspid aortic valve, mechanical aortic valve replacement, ascending aortic aneurysm, thoracic  aortic atherosclerosis, left upper lobe lung nodule, hypertension, and hyperlipidemia.  Status post AVR-mechanical valve.  On Coumadin .  Ascending aneurysm-unchanged dating back to many years.  Has been monitored on an annual basis.  Will continue to do so.  Hypertension-blood pressure minimally elevated.  He is not on any medications.  Is to monitor himself on a regular basis at home.  Recommended he continue to do so.  Left upper lobe lung nodule-careful reexamination of the nodule shows minimal increase in size over time.  Over about a decade it has doubled in size.  Was negative on PET.  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  Will continue to monitor but no indication to monitor more frequently.  Plan: Return 1 year with CT angio chest  Zelphia Higashi, MD [facility] [phone]

## 2023-05-07 ENCOUNTER — Telehealth: Payer: Self-pay | Admitting: *Deleted

## 2023-05-07 NOTE — Telephone Encounter (Signed)
 Called pt to remind that his Anticoagulation Appt is tomorrow at the new location: [address] inside the building; spoke with pt verbalized understanding.

## 2023-05-08 ENCOUNTER — Ambulatory Visit: Attending: Cardiology | Admitting: *Deleted

## 2023-05-08 DIAGNOSIS — Z5181 Encounter for therapeutic drug level monitoring: Secondary | ICD-10-CM

## 2023-05-08 DIAGNOSIS — Z952 Presence of prosthetic heart valve: Secondary | ICD-10-CM

## 2023-05-08 LAB — POCT INR: POC INR: 2.1

## 2023-05-08 NOTE — Patient Instructions (Signed)
 Description   Take 1 tablet of warfarin today and then continue on same dosage of Warfarin 1/2 tablet daily except 1 tablet on Sundays.  Recheck INR in 3 weeks.  Call us  with any medications Changes or concerns [phone]

## 2023-05-18 NOTE — Progress Notes (Unsigned)
 Ryan Lamb, male    DOB: February 15, 1955   MRN: [id number]   Brief patient profile:  77 yowm smoked only as child    referred to pulmonary clinic 05/19/2023 by Dr Arlene Ben  for cough x ? Covid infection 2022       Background of spring > fall itchy, runny nose but no cough/ wheeze since [facility] no change pattern or intensity since then and usually controls with otc's.   Pt not previously seen by PCCM service.    History of Present Illness  05/19/2023  Pulmonary/ 1st office eval/Ryan Lamb  Chief Complaint  Patient presents with   Consult    Cough for 2-3 years.  Dyspnea:  power walk 3 x week up to 30-40 min some hills no problem  Cough: middle of day after lunch/ helps to sip water  Strong smells / omeprazole 20 mg helped some too  Sleep: flat bed/ 1 pillow under head / wakes up dry mouth with cough sev times a week   and drinks water and back to bed ok s further coughing  SABA use: none  02 NWG:NFAO    No obvious day to day or daytime pattern/variability or assoc excess/ purulent sputum or mucus plugs or hemoptysis or cp or chest tightness, subjective wheeze or overt   hb symptoms.   Also denies any obvious fluctuation of symptoms with weather or environmental changes or other aggravating or alleviating factors except as outlined above   No unusual exposure hx or h/o childhood pna/ asthma or knowledge of premature birth.  Current Allergies, Complete Past Medical History, Past Surgical History, Family History, and Social History were reviewed in [employer] record.  ROS  The following are not active complaints unless bolded Hoarseness, sore throat/mild globus, dysphagia, dental problems, itching, sneezing,  nasal congestion or discharge of excess mucus or purulent secretions, ear ache,   fever, chills, sweats, unintended wt loss or wt gain, classically pleuritic or exertional cp,  orthopnea pnd or arm/hand swelling  or leg swelling, presyncope, palpitations,  abdominal pain, anorexia, nausea, vomiting, diarrhea  or change in bowel habits or change in bladder habits, change in stools or change in urine, dysuria, hematuria,  rash, arthralgias, visual complaints, headache, numbness, weakness or ataxia or problems with walking or coordination,  change in mood or  memory.             Outpatient Medications Prior to Visit  Medication Sig Dispense Refill   fluticasone  (FLONASE ) 50 MCG/ACT nasal spray SHAKE LIQUID AND USE 2 SPRAYS IN EACH NOSTRIL DAILY 16 g 6   loratadine  (CLARITIN ) 10 MG tablet Take 1 tablet (10 mg total) by mouth daily. 30 tablet 11   MULTIPLE VITAMIN PO Take 1 tablet by mouth daily.      pantoprazole  (PROTONIX ) 20 MG tablet TAKE 1 TABLET(20 MG) BY MOUTH DAILY AS NEEDED FOR HEARTBURN OR INDIGESTION 30 tablet 5   rosuvastatin  (CRESTOR ) 20 MG tablet TAKE 1 TABLET(20 MG) BY MOUTH DAILY 90 tablet 4   Saline (SIMPLY SALINE) 0.9 % AERS Place 2 each into the nose daily as needed. 500 mL 1   warfarin (COUMADIN ) 5 MG tablet TAKE 1/2 TO 1 TABLET BY MOUTH EVERY DAY AS DIRECTED BY COUMADIN  CLINIC 70 tablet 1   No facility-administered medications prior to visit.    Past Medical History:  Diagnosis Date   Aortic stenosis    Aortic valve prosthesis present    Coronary artery disease    Dissecting  aortic aneurysm (any part), thoracic (HCC)    Diverticulosis    Family history of adverse reaction to anesthesia    mother had complications "years ago"   Gastric ulcer    due to nsaids in past   Hypercholesterolemia    Mechanical heart valve present       Objective:     BP 138/80 (BP Location: Left Arm, Patient Position: Sitting, Cuff Size: Normal)   Pulse 77   Temp 98.4 F (36.9 C) (Oral)   Ht 5\' 9"  (1.753 m)   Wt 174 lb 6.4 oz (79.1 kg)   SpO2 100%   BMI 25.75 kg/m   SpO2: 100 % RA   Amb wm nad who did not cough the entire exam, occ throat clearing    HEENT : Oropharynx  clear/ no excess mucus or cobblestoning   Nasal  turbinates mild edema L>R s polyps or cyanosis    NECK :  without  apparent JVD/ palpable Nodes/TM    LUNGS: no acc muscle use,  Nl contour chest which is clear to A and P bilaterally without cough on insp or exp maneuvers   CV:  RRR  no s3 or murmur or increase in P2, and no edema   ABD:  soft and nontender   MS:  Gait nl   ext warm without deformities Or obvious joint restrictions  calf tenderness, cyanosis or clubbing    SKIN: warm and dry without lesions    NEURO:  alert, approp, nl sensorium with  no motor or cerebellar deficits apparent.    I personally reviewed images and agree with radiology impression as follows:   Chest CTa   04/23/23  Nonspecific benign MPNs        Assessment   Upper airway cough syndrome Onset ? Covid related in 2022  - Allergy screen 05/19/2023 >  Eos 0. /  IgE   - 05/19/2023 rec max gerd rx/ zyrtec q am and hard rock candies/  Pred x 6 d >>>  Upper airway cough syndrome (previously labeled PNDS),  is so named because it's frequently impossible to sort out how much is  CR/sinusitis with freq throat clearing (which can be related to primary GERD)   vs  causing  secondary (" extra esophageal")  GERD from wide swings in gastric pressure that occur with throat clearing, often  promoting self use of mint and menthol lozenges that reduce the lower esophageal sphincter tone and exacerbate the problem further in a cyclical fashion.   These are the same pts (now being labeled as having "irritable larynx syndrome" by some cough centers) who not infrequently have a history of having failed to tolerate ace inhibitors,  dry powder inhalers or biphosphonates or report having atypical/extraesophageal reflux symptoms that don't respond to standard doses of PPI  and are easily confused as having aecopd or asthma flares by even experienced allergists/ pulmonologists (myself included).   Of the three most common causes of  Sub-acute / recurrent or chronic cough, only one  (GERD)  can actually contribute to/ trigger  the other two (asthma and post nasal drip syndrome)  and perpetuate the cylce of cough.  While not intuitively obvious, many patients with chronic low grade reflux do not cough until there is a primary insult that disturbs the protective epithelial barrier and exposes sensitive nerve endings.   This is typically viral but can due to PNDS and  either may apply here.      >>> The point is  that once this occurs, it is difficult to eliminate the cycle  using anything but a maximally effective acid suppression regimen at least in the short run, accompanied by an appropriate diet to address non acid GERD and control / eliminate the cough itself with hard rock candies as much as possible, eliminate pnds with zyrtec daily (not prn) and >>> also added 6 day taper off  Prednisone starting at 40 mg per day in case of component of Th-2 driven upper or lower airways inflammation (if cough responds short term only to relapse before return while will on full rx for uacs (as above), then that would point to allergic rhinitis/ asthma or eos bronchitis as alternative dx)    The standardized cough guidelines published in Chest by Slater Duncan in 2006 are still the best available and consist of a multiple step process (up to 12!) , not a single office visit,  and are intended  to address this problem logically,  with an alogrithm dependent on response to empiric treatment at  each progressive step  to determine a specific diagnosis with  minimal addtional testing needed. Therefore if adherence is an issue or can't be accurately verified,  it's very unlikely the standard evaluation and treatment will be successful here.    Furthermore, response to therapy (other than acute cough suppression, which should only be used short term with avoidance of narcotic containing cough syrups if possible), can be a gradual process for which the patient is not likely to  perceive immediate  benefit.  Unlike going to an eye doctor where the best perscription is almost always the first one and is immediately effective, this is almost never the case in the management of chronic cough syndromes. Therefore the patient needs to commit up front to consistently adhere to recommendations  for up to 6 weeks of therapy directed at the likely underlying problem(s) before the response can be reasonably evaluated.   F/u  is 4 weeks - call sooner if needed, refer to allergy if screen is positive.          Each maintenance medication was reviewed in detail including emphasizing most importantly the difference between maintenance and prns and under what circumstances the prns are to be triggered using an action plan format where appropriate.  Total time for H and P, chart review, counseling,  and generating customized AVS unique to this office visit / same day charting = 48 min with new pt with  multiple  refractory respiratory  symptoms of uncertain etiology           Vernestine Gondola, MD 05/19/2023

## 2023-05-19 ENCOUNTER — Encounter: Payer: Self-pay | Admitting: Internal Medicine

## 2023-05-19 ENCOUNTER — Ambulatory Visit: Payer: Medicare Other | Admitting: Internal Medicine

## 2023-05-19 VITALS — BP 138/80 | HR 77 | Temp 98.4°F | Ht 69.0 in | Wt 174.4 lb

## 2023-05-19 DIAGNOSIS — R058 Other specified cough: Secondary | ICD-10-CM

## 2023-05-19 DIAGNOSIS — Z87891 Personal history of nicotine dependence: Secondary | ICD-10-CM

## 2023-05-19 MED ORDER — FAMOTIDINE 20 MG PO TABS
ORAL_TABLET | ORAL | 11 refills | Status: AC
Start: 2023-05-19 — End: ?

## 2023-05-19 MED ORDER — PREDNISONE 10 MG PO TABS
ORAL_TABLET | ORAL | 0 refills | Status: DC
Start: 2023-05-19 — End: 2023-08-11

## 2023-05-19 MED ORDER — PANTOPRAZOLE SODIUM 40 MG PO TBEC
40.0000 mg | DELAYED_RELEASE_TABLET | Freq: Every day | ORAL | 2 refills | Status: DC
Start: 2023-05-19 — End: 2023-08-25

## 2023-05-19 NOTE — Assessment & Plan Note (Signed)
 Onset ? Covid related in 2022  - Allergy screen 05/19/2023 >  Eos 0. /  IgE   - 05/19/2023 rec max gerd rx/ zyrtec q am and hard rock candies/  Pred x 6 d >>>  Upper airway cough syndrome (previously labeled PNDS),  is so named because it's frequently impossible to sort out how much is  CR/sinusitis with freq throat clearing (which can be related to primary GERD)   vs  causing  secondary (" extra esophageal")  GERD from wide swings in gastric pressure that occur with throat clearing, often  promoting self use of mint and menthol lozenges that reduce the lower esophageal sphincter tone and exacerbate the problem further in a cyclical fashion.   These are the same pts (now being labeled as having "irritable larynx syndrome" by some cough centers) who not infrequently have a history of having failed to tolerate ace inhibitors,  dry powder inhalers or biphosphonates or report having atypical/extraesophageal reflux symptoms that don't respond to standard doses of PPI  and are easily confused as having aecopd or asthma flares by even experienced allergists/ pulmonologists (myself included).   Of the three most common causes of  Sub-acute / recurrent or chronic cough, only one (GERD)  can actually contribute to/ trigger  the other two (asthma and post nasal drip syndrome)  and perpetuate the cylce of cough.  While not intuitively obvious, many patients with chronic low grade reflux do not cough until there is a primary insult that disturbs the protective epithelial barrier and exposes sensitive nerve endings.   This is typically viral but can due to PNDS and  either may apply here.      >>> The point is that once this occurs, it is difficult to eliminate the cycle  using anything but a maximally effective acid suppression regimen at least in the short run, accompanied by an appropriate diet to address non acid GERD and control / eliminate the cough itself with hard rock candies as much as possible, eliminate  pnds with zyrtec daily (not prn) and >>> also added 6 day taper off  Prednisone starting at 40 mg per day in case of component of Th-2 driven upper or lower airways inflammation (if cough responds short term only to relapse before return while will on full rx for uacs (as above), then that would point to allergic rhinitis/ asthma or eos bronchitis as alternative dx)    The standardized cough guidelines published in Chest by Slater Duncan in 2006 are still the best available and consist of a multiple step process (up to 12!) , not a single office visit,  and are intended  to address this problem logically,  with an alogrithm dependent on response to empiric treatment at  each progressive step  to determine a specific diagnosis with  minimal addtional testing needed. Therefore if adherence is an issue or can't be accurately verified,  it's very unlikely the standard evaluation and treatment will be successful here.    Furthermore, response to therapy (other than acute cough suppression, which should only be used short term with avoidance of narcotic containing cough syrups if possible), can be a gradual process for which the patient is not likely to  perceive immediate benefit.  Unlike going to an eye doctor where the best perscription is almost always the first one and is immediately effective, this is almost never the case in the management of chronic cough syndromes. Therefore the patient needs to commit up front to consistently  adhere to recommendations  for up to 6 weeks of therapy directed at the likely underlying problem(s) before the response can be reasonably evaluated.   F/u  is 4 weeks - call sooner if needed, refer to allergy if screen is positive.          Each maintenance medication was reviewed in detail including emphasizing most importantly the difference between maintenance and prns and under what circumstances the prns are to be triggered using an action plan format where  appropriate.  Total time for H and P, chart review, counseling,  and generating customized AVS unique to this office visit / same day charting = 48 min with new pt with  multiple  refractory respiratory  symptoms of uncertain etiology

## 2023-05-19 NOTE — Patient Instructions (Addendum)
 GERD (REFLUX)  is an extremely common cause of respiratory symptoms just like yours , many times with no obvious heartburn at all.    It can be treated with medication, but also with lifestyle changes including elevation of the head of your bed (ideally with 6 -8inch blocks ( risers)  under the headboard of your bed),  Smoking cessation, avoidance of late meals, excessive alcohol, and avoid fatty foods, chocolate, peppermint, colas, red wine, and acidic juices such as orange juice.  NO MINT OR MENTHOL PRODUCTS SO NO COUGH DROPS - Luden's cough drops  USE SUGARLESS CANDY INSTEAD (Jolley ranchers or Stover's or Environmental manager) or even ice chips will also do - the key is to swallow to prevent all throat clearing. NO OIL BASED VITAMINS - use powdered substitutes.  Avoid fish oil when coughing.   Pantoprazole  (protonix ) 40 mg  Take  30-60 min before first meal of the day and Pepcid (famotidine)  20 mg after supper until return to office - this is the best way to tell whether stomach acid is contributing to your problem.   Prednisone 10 mg take  4 each am x 2 days,   2 each am x 2 days,  1 each am x 2 days and stop   Continue zyrtec 10 mg one daily   Please remember to go to the lab department   for your tests - we will call you with the results when they are available.      Please schedule a follow up office visit in 4 weeks, sooner if needed

## 2023-05-20 DIAGNOSIS — R058 Other specified cough: Secondary | ICD-10-CM | POA: Diagnosis not present

## 2023-05-20 LAB — CBC WITH DIFFERENTIAL/PLATELET
Basophils Absolute: 0 10*3/uL (ref 0.0–0.1)
Basophils Relative: 0.8 % (ref 0.0–3.0)
Eosinophils Absolute: 0.3 10*3/uL (ref 0.0–0.7)
Eosinophils Relative: 4.4 % (ref 0.0–5.0)
HCT: 43.8 % (ref 39.0–52.0)
Hemoglobin: 14.9 g/dL (ref 13.0–17.0)
Lymphocytes Relative: 21.4 % (ref 12.0–46.0)
Lymphs Abs: 1.3 10*3/uL (ref 0.7–4.0)
MCHC: 34 g/dL (ref 30.0–36.0)
MCV: 89.3 fl (ref 78.0–100.0)
Monocytes Absolute: 0.4 10*3/uL (ref 0.1–1.0)
Monocytes Relative: 7.1 % (ref 3.0–12.0)
Neutro Abs: 3.9 10*3/uL (ref 1.4–7.7)
Neutrophils Relative %: 66.3 % (ref 43.0–77.0)
Platelets: 222 10*3/uL (ref 150.0–400.0)
RBC: 4.9 Mil/uL (ref 4.22–5.81)
RDW: 13.2 % (ref 11.5–15.5)
WBC: 5.9 10*3/uL (ref 4.0–10.5)

## 2023-05-21 ENCOUNTER — Ambulatory Visit: Payer: Self-pay | Admitting: Internal Medicine

## 2023-05-21 LAB — IGE: IgE (Immunoglobulin E), Serum: 34 kU/L (ref <=114)

## 2023-05-23 ENCOUNTER — Ambulatory Visit: Attending: Cardiology

## 2023-05-23 DIAGNOSIS — Z952 Presence of prosthetic heart valve: Secondary | ICD-10-CM | POA: Diagnosis not present

## 2023-05-23 DIAGNOSIS — Z5181 Encounter for therapeutic drug level monitoring: Secondary | ICD-10-CM

## 2023-05-23 LAB — POCT INR: INR: 2.7 (ref 2.0–3.0)

## 2023-05-23 NOTE — Patient Instructions (Signed)
 continue on same dosage of Warfarin 1/2 tablet daily except 1 tablet on Sundays.  Recheck INR in 6 weeks.  Call us  with any medications Changes or concerns [phone]

## 2023-06-30 ENCOUNTER — Ambulatory Visit: Attending: Cardiology

## 2023-06-30 DIAGNOSIS — Z5181 Encounter for therapeutic drug level monitoring: Secondary | ICD-10-CM

## 2023-06-30 DIAGNOSIS — Z952 Presence of prosthetic heart valve: Secondary | ICD-10-CM | POA: Diagnosis not present

## 2023-06-30 LAB — POCT INR: INR: 3.1 — AB (ref 2.0–3.0)

## 2023-06-30 NOTE — Patient Instructions (Signed)
 continue on same dosage of Warfarin 1/2 tablet daily except 1 tablet on Sundays.  Recheck INR in 7 weeks.  Call us  with any medications Changes or concerns [phone]

## 2023-06-30 NOTE — Progress Notes (Signed)
Please see anticoagulation encounter.

## 2023-08-07 ENCOUNTER — Other Ambulatory Visit: Payer: Self-pay | Admitting: Family Medicine

## 2023-08-09 NOTE — Progress Notes (Unsigned)
 Ryan Lamb, male    DOB: Feb 21, 1955   MRN: [id number]   Brief patient profile:  68 yowm smoked only as child quit age 68  referred to pulmonary clinic 05/19/2023 by Dr Katrinka  for cough x ? Covid infection 2022       Background of spring > fall itchy, runny nose but no cough/ wheeze since [facility] no change pattern or intensity since then and usually controls with otc's.   Pt not previously seen by PCCM service.    History of Present Illness  05/19/2023  Pulmonary/ 1st office eval/Xzander Gilham  Chief Complaint  Patient presents with   Consult    Cough for 2-3 years.  Dyspnea:  power walk 3 x week up to 30-40 min some hills no problem  Cough: middle of day after lunch/ helps to sip water  Strong smells / omeprazole 20 mg helped some too  Sleep: flat bed/ 1 pillow under head / wakes up dry mouth with cough sev times a week   and drinks water and back to bed ok s further coughing  SABA use: none  02 [id number]  Rec GERD diet reviewed, bed blocks rec  Pantoprazole  (protonix ) 40 mg  Take  30-60 min before first meal of the day and Pepcid  (famotidine )  20 mg after supper until return to office  Prednisone  10 mg take  4 each am x 2 days,   2 each am x 2 days,  1 each am x 2 days and stop  Continue zyrtec 10 mg one daily   Allergy screen 05/19/2023 >  Eos 0.3 /  IgE  34      08/11/2023  f/u ov/Tamarion Haymond re: Upper airway cough  maint on gerd rx and zyrtec daily   Chief Complaint  Patient presents with   Medical Management of Chronic Issues   Cough    Has been using bed blocks and cough is some better. He has some PND and occ produces some clear sputum.    Dyspnea:  power walks same  Cough: better as is dry mouth  Sleeping: bed blocks seem to help a lot    SABA use: none 02: none    No obvious day to day or daytime variability or assoc excess/ purulent sputum or mucus plugs or hemoptysis or cp or chest tightness, subjective wheeze or overt sinus or hb symptoms.    Also denies any obvious  fluctuation of symptoms with weather or environmental changes or other aggravating or alleviating factors except as outlined above   No unusual exposure hx or h/o childhood pna/ asthma or knowledge of premature birth.  Current Allergies, Complete Past Medical History, Past Surgical History, Family History, and Social History were reviewed in [employer] record.  ROS  The following are not active complaints unless bolded Hoarseness, sore throat, dysphagia, dental problems, itching, sneezing,  nasal congestion or discharge of excess mucus or purulent secretions, ear ache,   fever, chills, sweats, unintended wt loss or wt gain, classically pleuritic or exertional cp,  orthopnea pnd or arm/hand swelling  or leg swelling, presyncope, palpitations, abdominal pain, anorexia, nausea, vomiting, diarrhea  or change in bowel habits or change in bladder habits, change in stools or change in urine, dysuria, hematuria,  rash, arthralgias, visual complaints, headache, numbness, weakness or ataxia or problems with walking or coordination,  change in mood or  memory.        Current Meds  Medication Sig   famotidine  (PEPCID ) 20  MG tablet One after supper   fluticasone  (FLONASE ) 50 MCG/ACT nasal spray SHAKE LIQUID AND USE 2 SPRAYS IN EACH NOSTRIL DAILY   MULTIPLE VITAMIN PO Take 1 tablet by mouth daily.    pantoprazole  (PROTONIX ) 40 MG tablet Take 1 tablet (40 mg total) by mouth daily. Take 30-60 min before first meal of the day   rosuvastatin  (CRESTOR ) 20 MG tablet TAKE 1 TABLET(20 MG) BY MOUTH DAILY   Saline (SIMPLY SALINE) 0.9 % AERS Place 2 each into the nose daily as needed.   warfarin (COUMADIN ) 5 MG tablet TAKE 1/2 TO 1 TABLET BY MOUTH EVERY DAY AS DIRECTED BY COUMADIN  CLINIC         Past Medical History:  Diagnosis Date   Aortic stenosis    Aortic valve prosthesis present    Coronary artery disease    Dissecting aortic aneurysm (any part), thoracic (HCC)    Diverticulosis     Family history of adverse reaction to anesthesia    mother had complications years ago   Gastric ulcer    due to nsaids in past   Hypercholesterolemia    Mechanical heart valve present       Objective:    Wt Readings from Last 3 Encounters:  08/11/23 177 lb 12.8 oz (80.6 kg)  05/19/23 174 lb 6.4 oz (79.1 kg)  04/29/23 174 lb 6.4 oz (79.1 kg)      Vital signs reviewed  08/11/2023  - Note at rest 02 sats  98% on RA   General appearance:    amb somber wm nad  / minimal pseudowheeze better with PLM     HEENT : Oropharynx  clear      Nasal turbinates nl    NECK :  without  apparent JVD/ palpable Nodes/TM    LUNGS: no acc muscle use,  Nl contour chest which is clear to A and P bilaterally without cough on insp or exp maneuvers   CV:  RRR  no s3 or murmur or increase in P2, and no edema   ABD:  soft and nontender   MS:  Gait nl   ext warm without deformities Or obvious joint restrictions  calf tenderness, cyanosis or clubbing    SKIN: warm and dry without lesions    NEURO:  alert, approp, nl sensorium with  no motor or cerebellar deficits apparent.       Assessment

## 2023-08-11 ENCOUNTER — Ambulatory Visit: Admitting: Internal Medicine

## 2023-08-11 ENCOUNTER — Encounter: Payer: Self-pay | Admitting: Internal Medicine

## 2023-08-11 VITALS — BP 126/64 | HR 80 | Ht 69.0 in | Wt 177.8 lb

## 2023-08-11 DIAGNOSIS — R058 Other specified cough: Secondary | ICD-10-CM | POA: Diagnosis not present

## 2023-08-11 NOTE — Assessment & Plan Note (Signed)
 Onset ? Covid related in 2022  - Allergy screen 05/19/2023 >  Eos 0.3 /  IgE  34 - 05/19/2023 rec max gerd rx/ zyrtec q am and hard rock candies/  Pred x 6 d  - 08/11/2023 better to his satisfaction on zyrtec and gerd maint so first try change zyrtec to prn then wean gerd rx to OTC's until sees pcp but no regular pulmonary f/u needed if pfts come back ok - PFT's 08/11/2023 >>>   Discussed the recent press about ppi's in the context of a statistically significant (but questionably clinically relevant) increase in CRI in pts on ppi vs h2's > bottom line is the lowest dose of ppi that controls   gerd is the right dose and if that dose is zero that's fine esp since h2's are cheaper.  >>> taper first the ppi to otc then review options with Dr Katrinka (eg if cough flares as taper off PPI's ? Needs GI eval for long term options since only 31 y old   Pulmonary f/u though is prn          Each maintenance medication was reviewed in detail including emphasizing most importantly the difference between maintenance and prns and under what circumstances the prns are to be triggered using an action plan format where appropriate.  Total time for H and P, chart review, counseling,   and generating customized AVS unique to this office visit / same day charting = 23 min summary final f/u ov

## 2023-08-11 NOTE — Patient Instructions (Addendum)
 PFTs next available   Ok to try off zyrtec to see if any change in nasal symptoms or cough   Ok to substitute Prilosec otc (20 mg)  for Protonix  and if cough flares then see GI  Follow up here is as needed

## 2023-08-18 ENCOUNTER — Ambulatory Visit: Attending: Cardiology

## 2023-08-18 DIAGNOSIS — Z5181 Encounter for therapeutic drug level monitoring: Secondary | ICD-10-CM

## 2023-08-18 DIAGNOSIS — Z952 Presence of prosthetic heart valve: Secondary | ICD-10-CM | POA: Diagnosis not present

## 2023-08-18 LAB — POCT INR: INR: 3 (ref 2.0–3.0)

## 2023-08-18 NOTE — Patient Instructions (Signed)
 continue on same dosage of Warfarin 1/2 tablet daily except 1 tablet on Sundays.  Recheck INR in 8 weeks.  Call us  with any medications Changes or concerns [phone]

## 2023-08-18 NOTE — Progress Notes (Signed)
 INR 3.0; Please see anticoagulation encounter

## 2023-08-24 ENCOUNTER — Other Ambulatory Visit: Payer: Self-pay | Admitting: Internal Medicine

## 2023-08-24 DIAGNOSIS — R058 Other specified cough: Secondary | ICD-10-CM

## 2023-09-23 DIAGNOSIS — H524 Presbyopia: Secondary | ICD-10-CM | POA: Diagnosis not present

## 2023-10-06 NOTE — Progress Notes (Unsigned)
 Colleen Donahoe [place] III, male    DOB: 12-07-1955   MRN: [id number]   Brief patient profile:  68 yowm smoked only as child quit age 68  referred to pulmonary clinic 05/19/2023 by Dr Katrinka  for cough x ? Covid infection 2022       Background of spring > fall itchy, runny nose but no cough/ wheeze since [facility] no change pattern or intensity since then and usually controls with otc's.   Pt not previously seen by PCCM service.    History of Present Illness  05/19/2023  Pulmonary/ 1st office eval/Dnyla Antonetti  Chief Complaint  Patient presents with   Consult    Cough for 2-3 years.  Dyspnea:  power walk 3 x week up to 30-40 min some hills no problem  Cough: middle of day after lunch/ helps to sip water  Strong smells / omeprazole 20 mg helped some too  Sleep: flat bed/ 1 pillow under head / wakes up dry mouth with cough sev times a week   and drinks water and back to bed ok s further coughing  SABA use: none  02 ldz:wnwz  Rec GERD diet reviewed, bed blocks rec  Pantoprazole  (protonix ) 40 mg  Take  30-60 min before first meal of the day and Pepcid  (famotidine )  20 mg after supper until return to office  Prednisone  10 mg take  4 each am x 2 days,   2 each am x 2 days,  1 each am x 2 days and stop  Continue zyrtec 10 mg one daily   Allergy screen 05/19/2023 >  Eos 0.3 /  IgE  34      08/11/2023  f/u ov/Asusena Sigley re: Upper airway cough  maint on gerd rx and zyrtec daily   Chief Complaint  Patient presents with   Medical Management of Chronic Issues   Cough    Has been using bed blocks and cough is some better. He has some PND and occ produces some clear sputum.    Dyspnea:  power walks same  Cough: better as is dry mouth  Sleeping: bed blocks seem to help a lot    SABA use: none 02: none   Rec     10/08/2023  f/u ov/Keshanna Riso re: UACS   maint on ***  No chief complaint on file.   Dyspnea:  *** Cough: *** Sleeping: *** resp cc  SABA use: *** 02: ***  Lung cancer screening :  ***    No  obvious day to day or daytime variability or assoc excess/ purulent sputum or mucus plugs or hemoptysis or cp or chest tightness, subjective wheeze or overt sinus or hb symptoms.    Also denies any obvious fluctuation of symptoms with weather or environmental changes or other aggravating or alleviating factors except as outlined above   No unusual exposure hx or h/o childhood pna/ asthma or knowledge of premature birth.  Current Allergies, Complete Past Medical History, Past Surgical History, Family History, and Social History were reviewed in [employer] record.  ROS  The following are not active complaints unless bolded Hoarseness, sore throat, dysphagia, dental problems, itching, sneezing,  nasal congestion or discharge of excess mucus or purulent secretions, ear ache,   fever, chills, sweats, unintended wt loss or wt gain, classically pleuritic or exertional cp,  orthopnea pnd or arm/hand swelling  or leg swelling, presyncope, palpitations, abdominal pain, anorexia, nausea, vomiting, diarrhea  or change in bowel habits or change in bladder habits, change  in stools or change in urine, dysuria, hematuria,  rash, arthralgias, visual complaints, headache, numbness, weakness or ataxia or problems with walking or coordination,  change in mood or  memory.        No outpatient medications have been marked as taking for the 10/08/23 encounter (Appointment) with Darlean Ozell NOVAK, MD.       Past Medical History:  Diagnosis Date   Aortic stenosis    Aortic valve prosthesis present    Coronary artery disease    Dissecting aortic aneurysm (any part), thoracic (HCC)    Diverticulosis    Family history of adverse reaction to anesthesia    mother had complications years ago   Gastric ulcer    due to nsaids in past   Hypercholesterolemia    Mechanical heart valve present       Objective:    Wts   10/08/2023       ***   08/11/23 177 lb 12.8 oz (80.6 kg)  05/19/23 174 lb  6.4 oz (79.1 kg)  04/29/23 174 lb 6.4 oz (79.1 kg)    Vital signs reviewed  10/08/2023  - Note at rest 02 sats  ***% on ***   General appearance:    ***    minimal pseudowheeze better with PLM ***        Assessment

## 2023-10-08 ENCOUNTER — Ambulatory Visit: Admitting: Internal Medicine

## 2023-10-08 ENCOUNTER — Encounter: Payer: Self-pay | Admitting: Internal Medicine

## 2023-10-08 ENCOUNTER — Ambulatory Visit (INDEPENDENT_AMBULATORY_CARE_PROVIDER_SITE_OTHER): Admitting: *Deleted

## 2023-10-08 VITALS — BP 108/70 | HR 83 | Ht 69.0 in | Wt 169.0 lb

## 2023-10-08 DIAGNOSIS — R058 Other specified cough: Secondary | ICD-10-CM | POA: Diagnosis not present

## 2023-10-08 DIAGNOSIS — J4530 Mild persistent asthma, uncomplicated: Secondary | ICD-10-CM

## 2023-10-08 LAB — PULMONARY FUNCTION TEST
DL/VA % pred: 109 %
DL/VA: 4.5 ml/min/mmHg/L
DLCO cor % pred: 81 %
DLCO cor: 20.83 ml/min/mmHg
DLCO unc % pred: 81 %
DLCO unc: 20.83 ml/min/mmHg
FEF 25-75 Post: 5.09 L/s
FEF 25-75 Pre: 2.79 L/s
FEF2575-%Change-Post: 82 %
FEF2575-%Pred-Post: 200 %
FEF2575-%Pred-Pre: 110 %
FEV1-%Change-Post: 16 %
FEV1-%Pred-Post: 81 %
FEV1-%Pred-Pre: 69 %
FEV1-Post: 2.63 L
FEV1-Pre: 2.24 L
FEV1FVC-%Change-Post: -1 %
FEV1FVC-%Pred-Pre: 113 %
FEV6-%Change-Post: 18 %
FEV6-%Pred-Post: 76 %
FEV6-%Pred-Pre: 64 %
FEV6-Post: 3.16 L
FEV6-Pre: 2.67 L
FEV6FVC-%Change-Post: 0 %
FEV6FVC-%Pred-Post: 104 %
FEV6FVC-%Pred-Pre: 105 %
FVC-%Change-Post: 18 %
FVC-%Pred-Post: 72 %
FVC-%Pred-Pre: 61 %
FVC-Post: 3.18 L
FVC-Pre: 2.67 L
Post FEV1/FVC ratio: 83 %
Post FEV6/FVC ratio: 99 %
Pre FEV1/FVC ratio: 84 %
Pre FEV6/FVC Ratio: 100 %
RV % pred: 93 %
RV: 2.17 L
TLC % pred: 74 %
TLC: 5.06 L

## 2023-10-08 MED ORDER — BUDESONIDE-FORMOTEROL FUMARATE 80-4.5 MCG/ACT IN AERO: INHALATION_SPRAY | RESPIRATORY_TRACT | 12 refills | Status: AC

## 2023-10-08 MED ORDER — BREZTRI AEROSPHERE 160-9-4.8 MCG/ACT IN AERO: 2.0000 | INHALATION_SPRAY | Freq: Two times a day (BID) | RESPIRATORY_TRACT | Status: AC

## 2023-10-08 NOTE — Progress Notes (Signed)
 Full PFT performed today.

## 2023-10-08 NOTE — Patient Instructions (Signed)
 Full PFT performed today.

## 2023-10-08 NOTE — Assessment & Plan Note (Addendum)
 PFT's  10/08/2023    FEV1 2.63  (81 % ) ratio 0.83  p 16 % improvement from saba p 0 prior to study with DLCO  20.8 (81%)   and FV curve nl     - 10/08/2023  After extensive coaching inhaler device,  effectiveness =    80% on sample of breztri >>> rx symbiort 80 up to 2 bid Based on two studies from NEJM  378; 20 p 1865 (2018) and 380 : p2020-30 (2019) in pts with mild asthma it is reasonable to use low dose symbicort eg 80 2bid prn flare in this setting but I emphasized this was only shown with symbicort and takes advantage of the rapid onset of action but is not the same as rescue therapy but can be stopped once the acute symptoms have resolved and the need for rescue has been minimized (< 2 x weekly)    F/u q 6 m, sooner prn          Each maintenance medication was reviewed in detail including emphasizing most importantly the difference between maintenance and prns and under what circumstances the prns are to be triggered using an action plan format where appropriate.  Total time for H and P, chart review, counseling, reviewing hfa device(s) and generating customized AVS unique to this office visit / same day charting = 30 min for new start asthma pt

## 2023-10-08 NOTE — Patient Instructions (Addendum)
 Symbicort  80 x 2puffs (= breztri one puff) 1st thing in am and 12 hours later and after the first week ok to use as needed  Work on inhaler technique:  relax and gently blow all the way out then take a nice smooth full deep breath back in, triggering the inhaler at same time you start breathing in.  Hold breath in for at least  5 seconds if you can. Blow out symbicort or breztri  thru nose. Rinse and gargle with water when done.  If mouth or throat bother you at all,  try brushing teeth/gums/tongue with arm and hammer toothpaste/ make a slurry and gargle and spit out.        Please schedule a follow up visit in 6  months but call sooner if needed - bring respiratory medications

## 2023-10-11 ENCOUNTER — Other Ambulatory Visit: Payer: Self-pay | Admitting: Family Medicine

## 2023-10-11 DIAGNOSIS — Z952 Presence of prosthetic heart valve: Secondary | ICD-10-CM

## 2023-10-12 ENCOUNTER — Ambulatory Visit: Payer: Self-pay | Admitting: Internal Medicine

## 2023-10-13 ENCOUNTER — Ambulatory Visit: Attending: Cardiology

## 2023-10-13 DIAGNOSIS — Z5181 Encounter for therapeutic drug level monitoring: Secondary | ICD-10-CM | POA: Diagnosis not present

## 2023-10-13 DIAGNOSIS — Z952 Presence of prosthetic heart valve: Secondary | ICD-10-CM | POA: Diagnosis not present

## 2023-10-13 LAB — POCT INR: INR: 2.8 (ref 2.0–3.0)

## 2023-10-13 NOTE — Progress Notes (Signed)
 INR 2.8 Please see anticoagulation encounter continue on same dosage of Warfarin 1/2 tablet daily except 1 tablet on Sundays.  Recheck INR in 8 weeks.  Call us  with any medications Changes or concerns [phone]

## 2023-10-13 NOTE — Patient Instructions (Signed)
 continue on same dosage of Warfarin 1/2 tablet daily except 1 tablet on Sundays.  Recheck INR in 8 weeks.  Call us  with any medications Changes or concerns [phone]

## 2023-11-20 ENCOUNTER — Ambulatory Visit (INDEPENDENT_AMBULATORY_CARE_PROVIDER_SITE_OTHER)

## 2023-11-20 VITALS — Ht 69.0 in | Wt 168.0 lb

## 2023-11-20 DIAGNOSIS — Z0000 Encounter for general adult medical examination without abnormal findings: Secondary | ICD-10-CM | POA: Diagnosis not present

## 2023-11-20 NOTE — Patient Instructions (Signed)
 Ryan Lamb,  Thank you for taking the time for your Medicare Wellness Visit. I appreciate your continued commitment to your health goals. Please review the care plan we discussed, and feel free to reach out if I can assist you further.  Please note that Annual Wellness Visits do not include a physical exam. Some assessments may be limited, especially if the visit was conducted virtually. If needed, we may recommend an in-person follow-up with your provider.  Ongoing Care Seeing your primary care provider every 3 to 6 months helps us  monitor your health and provide consistent, personalized care.   Referrals If a referral was made during today's visit and you haven't received any updates within two weeks, please contact the referred provider directly to check on the status.  Recommended Screenings:  Health Maintenance  Topic Date Due   COVID-19 Vaccine (3 - Moderna risk series) 12/06/2023*   Flu Shot  04/06/2024*   Medicare Annual Wellness Visit  11/19/2024   DTaP/Tdap/Td vaccine (2 - Td or Tdap) 11/20/2024   Colon Cancer Screening  08/15/2025   Pneumococcal Vaccine for age over 55  Completed   Hepatitis C Screening  Completed   Zoster (Shingles) Vaccine  Completed   Meningitis B Vaccine  Aged Out  *Topic was postponed. The date shown is not the original due date.       11/20/2023    1:34 PM  Advanced Directives  Does Patient Have a Medical Advance Directive? No  Would patient like information on creating a medical advance directive? No - Patient declined    Vision: Annual vision screenings are recommended for early detection of glaucoma, cataracts, and diabetic retinopathy. These exams can also reveal signs of chronic conditions such as diabetes and high blood pressure.  Dental: Annual dental screenings help detect early signs of oral cancer, gum disease, and other conditions linked to overall health, including heart disease and diabetes.  Please see the attached documents  for additional preventive care recommendations.

## 2023-11-20 NOTE — Progress Notes (Signed)
 Chief Complaint  Patient presents with   Medicare Wellness     Subjective:   Ryan Lamb is a 68 y.o. male who presents for a Medicare Annual Wellness Visit.  Allergies (verified) Quinolones and Penicillins   History: Past Medical History:  Diagnosis Date   Aortic stenosis    Aortic valve prosthesis present    Coronary artery disease    Dissecting aortic aneurysm (any part), thoracic (HCC)    Diverticulosis    Family history of adverse reaction to anesthesia    mother had complications years ago   Gastric ulcer    due to nsaids in past   Hypercholesterolemia    Mechanical heart valve present    Past Surgical History:  Procedure Laterality Date   CARDIAC VALVE REPLACEMENT     EEC     with propofol     ESOPHAGOGASTRODUODENOSCOPY  01/2010, 04/2010   ESOPHAGOGASTRODUODENOSCOPY     cauterize after nsaid related ulcer   EXCISION MASS ABDOMINAL Bilateral 06/17/2022   Procedure: EXCISION MASS LEFT UPPER ABDOMINAL WALL AND EXCISION MASS RIGHT LOWER ABDOMINAL WALL;  Surgeon: Aron Shoulders, MD;  Location: [facility];  Service: General;  Laterality: Bilateral;   EXCISION MASS LOWER EXTREMETIES Right 06/17/2022   Procedure: EXCISION OF RIGHT ANTERIOR THIGH MASS;  Surgeon: Aron Shoulders, MD;  Location: [facility];  Service: General;  Laterality: Right;   EXCISION MASS UPPER EXTREMETIES Right 06/17/2022   Procedure: EXCISION MASS RIGHT LOWER ARM;  Surgeon: Aron Shoulders, MD;  Location: [facility];  Service: General;  Laterality: Right;   EXCISION OF BACK LESION Right 06/17/2022   Procedure: EXCISION OF RIGHT LOWER BACK MASS;  Surgeon: Aron Shoulders, MD;  Location: [facility];  Service: General;  Laterality: Right;   EXPLORATION POST OPERATIVE OPEN HEART  December 12, 1992, 1972   Family History  Problem Relation Age of Onset   Coronary artery disease Mother        CABG. 81 in 12-Dec-2017.    Coronary artery disease Father         aortic valve replacement. died at 49 in 13-Dec-2022   Skin cancer Father         49 in December 12, 2017.    Healthy Sister    Schizophrenia Sister        home health care   Healthy Brother        overweight   GI problems Neg Hx    Social History   Occupational History   Not on file  Tobacco Use   Smoking status: Former    Current packs/day: 0.00    Average packs/day: 0.5 packs/day for 5.0 years (2.5 ttl pk-yrs)    Types: Cigarettes    Start date: 10/17/1972    Quit date: 10/17/1977    Years since quitting: 46.1   Smokeless tobacco: Never  Substance and Sexual Activity   Alcohol use: No   Drug use: No   Sexual activity: Not on file   Tobacco Counseling Counseling given: Not Answered  SDOH Screenings   Food Insecurity: No Food Insecurity (11/20/2023)  Housing: Low Risk  (11/20/2023)  Transportation Needs: No Transportation Needs (11/20/2023)  Utilities: Not At Risk (11/20/2023)  Alcohol Screen: Low Risk  (11/20/2023)  Depression (PHQ2-9): Low Risk  (11/20/2023)  Financial Resource Strain: Low Risk  (11/20/2023)  Physical Activity: Sufficiently Active (11/20/2023)  Social Connections: Moderately Isolated (11/20/2023)  Stress: No Stress Concern Present (11/20/2023)  Tobacco Use: Medium Risk (11/20/2023)  Health  Literacy: Adequate Health Literacy (11/20/2023)   See flowsheets for full screening details  Depression Screen PHQ 2 & 9 Depression Scale- Over the past 2 weeks, how often have you been bothered by any of the following problems? Little interest or pleasure in doing things: 0 Feeling down, depressed, or hopeless (PHQ Adolescent also includes...irritable): 0 PHQ-2 Total Score: 0     Goals Addressed             This Visit's Progress    Patient Stated he would like to lose 8 lbs         Visit info / Clinical Intake: Medicare Wellness Visit Type:: Initial Annual Wellness Visit Persons participating in visit:: patient Medicare Wellness Visit Mode:: Telephone If  telephone:: video declined Because this visit was a virtual/telehealth visit:: pt reported vitals If Telephone or Video please confirm:: I connected with the patient using audio enabled telemedicine application and verified that I am speaking with the correct person using two identifiers Patient Location:: Home Provider Location:: [place] office Information given by:: patient Interpreter Needed?: No Pre-visit prep was completed: yes AWV questionnaire completed by patient prior to visit?: no Living arrangements:: with family/others Patient's Overall Health Status Rating: very good Typical amount of pain: none Does pain affect daily life?: no Are you currently prescribed opioids?: no  Dietary Habits and Nutritional Risks How many meals a day?: 3 Eats fruit and vegetables daily?: (!) no Most meals are obtained by: preparing own meals Diabetic:: no  Functional Status Activities of Daily Living (to include ambulation/medication): Independent Ambulation: Independent Medication Administration: Independent Home Management: Independent Manage your own finances?: yes Primary transportation is: driving Concerns about vision?: no *vision screening is required for WTM* Concerns about hearing?: no (does use hearing aids occasionally 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)  Fall Screening Falls in the past year?: 0 Number of falls in past year: 0 Was there an injury with Fall?: 0 Fall Risk Category Calculator: 0 Patient Fall Risk Level: Low Fall Risk  Fall Risk Patient at Risk for Falls Due to: No Fall Risks  Home and Transportation Safety: All rugs have non-skid backing?: yes All stairs or steps have railings?: (!) no Grab bars in the bathtub or shower?: (!) no Have non-skid surface in bathtub or shower?: yes Good home lighting?: yes Regular seat belt use?: yes Hospital stays in the last year:: no  Cognitive Assessment Difficulty concentrating, remembering, or making decisions? :  no Will 6CIT or Mini Cog be Completed: yes What year is it?: 0 points What month is it?: 0 points Give patient an address phrase to remember (5 components): [address] [place] About what time is it?: 0 points Count backwards from 20 to 1: 0 points Say the months of the year in reverse: 0 points Repeat the address phrase from earlier: 0 points 6 CIT Score: 0 points  Advance Directives (For Healthcare) Does Patient Have a Medical Advance Directive?: No Would patient like information on creating a medical advance directive?: No - Patient declined  Reviewed/Updated  Reviewed/Updated: Reviewed All (Medical, Surgical, Family, Medications, Allergies, Care Teams, Patient Goals)        Objective:    Today's Vitals   11/20/23 1333  Weight: 168 lb (76.2 kg)  Height: 5' 9 (1.753 m)   Body mass index is 24.81 kg/m.  Current Medications (verified) Outpatient Encounter Medications as of 11/20/2023  Medication Sig   budesonide-formoterol (SYMBICORT) 80-4.5 MCG/ACT inhaler Take 2 puffs first thing in am and then  another 2 puffs about 12 hours later.   budesonide-glycopyrrolate-formoterol (BREZTRI AEROSPHERE) 160-9-4.8 MCG/ACT AERO inhaler Inhale 2 puffs into the lungs in the morning and at bedtime.   famotidine  (PEPCID ) 20 MG tablet One after supper   fluticasone  (FLONASE ) 50 MCG/ACT nasal spray SHAKE LIQUID AND USE 2 SPRAYS IN EACH NOSTRIL DAILY   MULTIPLE VITAMIN PO Take 1 tablet by mouth daily.    pantoprazole  (PROTONIX ) 40 MG tablet TAKE 1 TABLET(40 MG) BY MOUTH DAILY 30 TO 60 MINUTES BEFORE FIRST MEAL OF THE DAY   rosuvastatin  (CRESTOR ) 20 MG tablet TAKE 1 TABLET(20 MG) BY MOUTH DAILY   Saline (SIMPLY SALINE) 0.9 % AERS Place 2 each into the nose daily as needed.   warfarin (COUMADIN ) 5 MG tablet TAKE 1/2 TO 1 TABLET BY MOUTH EVERY DAY AS DIRECTED BY COUMADIN  CLINIC   No facility-administered encounter medications on file as of 11/20/2023.   Hearing/Vision screen No  results found. Immunizations and Health Maintenance Health Maintenance  Topic Date Due   Medicare Annual Wellness (AWV)  08/22/2023   COVID-19 Vaccine (3 - Moderna risk series) 12/06/2023 (Originally 07/07/2019)   Influenza Vaccine  04/06/2024 (Originally 08/08/2023)   DTaP/Tdap/Td (2 - Td or Tdap) 11/20/2024   Colonoscopy  08/15/2025   Pneumococcal Vaccine: 50+ Years  Completed   Hepatitis C Screening  Completed   Zoster Vaccines- Shingrix   Completed   Meningococcal B Vaccine  Aged Out        Assessment/Plan:  This is a routine wellness examination for [place].  Patient Care Team: Katrinka Garnette KIDD, MD as PCP - General (Family Medicine) O'Neal, Darryle Ned, MD as PCP - Cardiology (Cardiology) Army Dallas NOVAK, MD (Inactive) as Consulting Physician (Cardiothoracic Surgery) Cindie Ole DASEN, MD as Consulting Physician (Cardiology) Hobart Powell BRAVO, MD (Inactive) as Attending Physician (Cardiology)  I have personally reviewed and noted the following in the patient's chart:   Medical and social history Use of alcohol, tobacco or illicit drugs  Current medications and supplements including opioid prescriptions. Functional ability and status Nutritional status Physical activity Advanced directives List of other physicians Hospitalizations, surgeries, and ER visits in previous 12 months Vitals Screenings to include cognitive, depression, and falls Referrals and appointments  No orders of the defined types were placed in this encounter.  In addition, I have reviewed and discussed with patient certain preventive protocols, quality metrics, and best practice recommendations. A written personalized care plan for preventive services as well as general preventive health recommendations were provided to patient.   Arnette LOISE Hoots, CMA   11/20/2023   No follow-ups on file.  After Visit Summary: (MyChart) Due to this being a telephonic visit, the after visit summary with  patients personalized plan was offered to patient via MyChart   Nurse Notes: Patient is trying to lose 8 lbs and is working on his eating habits to include fruits and vegetables daily,

## 2023-12-08 ENCOUNTER — Ambulatory Visit: Attending: Cardiology

## 2023-12-08 DIAGNOSIS — Z952 Presence of prosthetic heart valve: Secondary | ICD-10-CM | POA: Diagnosis not present

## 2023-12-08 DIAGNOSIS — Z5181 Encounter for therapeutic drug level monitoring: Secondary | ICD-10-CM

## 2023-12-08 LAB — POCT INR: INR: 2.6 (ref 2.0–3.0)

## 2023-12-08 NOTE — Patient Instructions (Signed)
 continue on same dosage of Warfarin 1/2 tablet daily except 1 tablet on Sundays.  Recheck INR in 8 weeks.  Call us  with any medications Changes or concerns [phone]

## 2023-12-08 NOTE — Progress Notes (Signed)
 INR 2.6 Please see anticoagulation encounter continue on same dosage of Warfarin 1/2 tablet daily except 1 tablet on Sundays.  Recheck INR in 8 weeks.  Call us  with any medications Changes or concerns [phone]

## 2023-12-23 ENCOUNTER — Other Ambulatory Visit: Payer: Self-pay | Admitting: Internal Medicine

## 2023-12-23 DIAGNOSIS — R058 Other specified cough: Secondary | ICD-10-CM

## 2023-12-24 NOTE — Telephone Encounter (Signed)
 Pt requesting refill of pantoprazole  - not mentioned in LOV notes to continue or d/c. Please advise, thank you

## 2024-01-16 ENCOUNTER — Other Ambulatory Visit: Payer: Self-pay

## 2024-01-16 DIAGNOSIS — Z952 Presence of prosthetic heart valve: Secondary | ICD-10-CM

## 2024-01-16 MED ORDER — WARFARIN SODIUM 5 MG PO TABS: ORAL_TABLET | ORAL | 1 refills | Status: AC

## 2024-02-02 ENCOUNTER — Ambulatory Visit

## 2024-02-09 ENCOUNTER — Ambulatory Visit

## 2024-02-19 ENCOUNTER — Ambulatory Visit

## 2024-03-08 ENCOUNTER — Encounter: Payer: Medicare Other | Admitting: Family Medicine

## 2024-03-15 ENCOUNTER — Ambulatory Visit: Admitting: Internal Medicine
